# Patient Record
Sex: Female | Born: 1960 | Race: White | Hispanic: No | Marital: Married | Smoking: Never smoker
Health system: Southern US, Community
[De-identification: ages and names within clinical notes are randomized; demographics above are authoritative.]

## PROBLEM LIST (undated history)

## (undated) DIAGNOSIS — F341 Dysthymic disorder: Secondary | ICD-10-CM

## (undated) DIAGNOSIS — E282 Polycystic ovarian syndrome: Secondary | ICD-10-CM

## (undated) DIAGNOSIS — G43909 Migraine, unspecified, not intractable, without status migrainosus: Secondary | ICD-10-CM

## (undated) DIAGNOSIS — Z86711 Personal history of pulmonary embolism: Secondary | ICD-10-CM

## (undated) DIAGNOSIS — R609 Edema, unspecified: Secondary | ICD-10-CM

## (undated) DIAGNOSIS — IMO0001 Reserved for inherently not codable concepts without codable children: Secondary | ICD-10-CM

## (undated) DIAGNOSIS — K2990 Gastroduodenitis, unspecified, without bleeding: Secondary | ICD-10-CM

## (undated) DIAGNOSIS — D649 Anemia, unspecified: Secondary | ICD-10-CM

## (undated) DIAGNOSIS — G4700 Insomnia, unspecified: Secondary | ICD-10-CM

## (undated) DIAGNOSIS — M216X9 Other acquired deformities of unspecified foot: Secondary | ICD-10-CM

## (undated) DIAGNOSIS — I10 Essential (primary) hypertension: Secondary | ICD-10-CM

## (undated) DIAGNOSIS — F32A Depression, unspecified: Secondary | ICD-10-CM

## (undated) DIAGNOSIS — E669 Obesity, unspecified: Secondary | ICD-10-CM

## (undated) DIAGNOSIS — R569 Unspecified convulsions: Secondary | ICD-10-CM

## (undated) DIAGNOSIS — K2970 Gastritis, unspecified, without bleeding: Secondary | ICD-10-CM

## (undated) DIAGNOSIS — R251 Tremor, unspecified: Secondary | ICD-10-CM

## (undated) DIAGNOSIS — E785 Hyperlipidemia, unspecified: Secondary | ICD-10-CM

## (undated) DIAGNOSIS — F329 Major depressive disorder, single episode, unspecified: Secondary | ICD-10-CM

## (undated) DIAGNOSIS — G4730 Sleep apnea, unspecified: Secondary | ICD-10-CM

## (undated) DIAGNOSIS — A6920 Lyme disease, unspecified: Secondary | ICD-10-CM

## (undated) DIAGNOSIS — I6521 Occlusion and stenosis of right carotid artery: Secondary | ICD-10-CM

## (undated) DIAGNOSIS — F419 Anxiety disorder, unspecified: Secondary | ICD-10-CM

## (undated) DIAGNOSIS — K219 Gastro-esophageal reflux disease without esophagitis: Secondary | ICD-10-CM

## (undated) DIAGNOSIS — E041 Nontoxic single thyroid nodule: Secondary | ICD-10-CM

## (undated) DIAGNOSIS — T7840XA Allergy, unspecified, initial encounter: Secondary | ICD-10-CM

## (undated) DIAGNOSIS — N200 Calculus of kidney: Secondary | ICD-10-CM

## (undated) HISTORY — DX: Essential (primary) hypertension: I10

## (undated) HISTORY — DX: Lyme disease, unspecified: A69.20

## (undated) HISTORY — DX: Insomnia, unspecified: G47.00

## (undated) HISTORY — DX: Gastro-esophageal reflux disease without esophagitis: K21.9

## (undated) HISTORY — DX: Anemia, unspecified: D64.9

## (undated) HISTORY — PX: COLONOSCOPY: SHX174

## (undated) HISTORY — DX: Personal history of pulmonary embolism: Z86.711

## (undated) HISTORY — PX: OTHER SURGICAL HISTORY: SHX169

## (undated) HISTORY — DX: Allergy, unspecified, initial encounter: T78.40XA

## (undated) HISTORY — DX: Polycystic ovarian syndrome: E28.2

## (undated) HISTORY — DX: Hyperlipidemia, unspecified: E78.5

## (undated) HISTORY — DX: Major depressive disorder, single episode, unspecified: F32.9

## (undated) HISTORY — DX: Anxiety disorder, unspecified: F41.9

## (undated) HISTORY — DX: Migraine, unspecified, not intractable, without status migrainosus: G43.909

## (undated) HISTORY — DX: Occlusion and stenosis of right carotid artery: I65.21

## (undated) HISTORY — DX: Obesity, unspecified: E66.9

## (undated) HISTORY — DX: Reserved for inherently not codable concepts without codable children: IMO0001

## (undated) HISTORY — DX: Tremor, unspecified: R25.1

## (undated) HISTORY — DX: Sleep apnea, unspecified: G47.30

## (undated) HISTORY — DX: Other acquired deformities of unspecified foot: M21.6X9

## (undated) HISTORY — DX: Nontoxic single thyroid nodule: E04.1

## (undated) HISTORY — DX: Depression, unspecified: F32.A

## (undated) HISTORY — DX: Dysthymic disorder: F34.1

## (undated) HISTORY — DX: Calculus of kidney: N20.0

## (undated) HISTORY — DX: Edema, unspecified: R60.9

## (undated) HISTORY — DX: Gastroduodenitis, unspecified, without bleeding: K29.90

## (undated) HISTORY — DX: Gastritis, unspecified, without bleeding: K29.70

## (undated) HISTORY — DX: Unspecified convulsions: R56.9

---

## 1997-09-30 ENCOUNTER — Encounter: Admission: RE | Admit: 1997-09-30 | Discharge: 1997-12-29 | Payer: Self-pay | Admitting: Family Medicine

## 2001-03-06 ENCOUNTER — Encounter: Payer: Self-pay | Admitting: Otolaryngology

## 2001-03-06 ENCOUNTER — Encounter: Admission: RE | Admit: 2001-03-06 | Discharge: 2001-03-06 | Payer: Self-pay | Admitting: Otolaryngology

## 2002-04-13 ENCOUNTER — Encounter: Admission: RE | Admit: 2002-04-13 | Discharge: 2002-04-13 | Payer: Self-pay | Admitting: Family Medicine

## 2002-04-13 ENCOUNTER — Encounter: Payer: Self-pay | Admitting: Family Medicine

## 2002-05-05 ENCOUNTER — Encounter: Admission: RE | Admit: 2002-05-05 | Discharge: 2002-08-03 | Payer: Self-pay | Admitting: Family Medicine

## 2004-02-14 ENCOUNTER — Inpatient Hospital Stay (HOSPITAL_COMMUNITY): Admission: EM | Admit: 2004-02-14 | Discharge: 2004-02-16 | Payer: Self-pay | Admitting: Family Medicine

## 2004-02-14 ENCOUNTER — Ambulatory Visit: Payer: Self-pay | Admitting: Family Medicine

## 2004-02-17 ENCOUNTER — Ambulatory Visit: Payer: Self-pay | Admitting: Family Medicine

## 2004-02-21 ENCOUNTER — Ambulatory Visit: Payer: Self-pay | Admitting: Family Medicine

## 2004-02-24 ENCOUNTER — Ambulatory Visit: Payer: Self-pay | Admitting: Sports Medicine

## 2004-02-29 ENCOUNTER — Ambulatory Visit: Payer: Self-pay | Admitting: Family Medicine

## 2004-03-03 ENCOUNTER — Ambulatory Visit: Payer: Self-pay | Admitting: Sports Medicine

## 2004-03-09 ENCOUNTER — Ambulatory Visit: Payer: Self-pay | Admitting: Family Medicine

## 2004-03-14 ENCOUNTER — Ambulatory Visit: Payer: Self-pay | Admitting: Family Medicine

## 2004-03-17 ENCOUNTER — Ambulatory Visit: Payer: Self-pay | Admitting: Sports Medicine

## 2004-03-23 ENCOUNTER — Ambulatory Visit: Payer: Self-pay | Admitting: Family Medicine

## 2004-03-30 ENCOUNTER — Encounter: Admission: RE | Admit: 2004-03-30 | Discharge: 2004-03-30 | Payer: Self-pay | Admitting: Family Medicine

## 2004-03-31 ENCOUNTER — Ambulatory Visit: Payer: Self-pay | Admitting: Family Medicine

## 2004-04-07 ENCOUNTER — Ambulatory Visit: Payer: Self-pay | Admitting: Family Medicine

## 2004-04-14 ENCOUNTER — Ambulatory Visit: Payer: Self-pay | Admitting: Family Medicine

## 2004-04-24 ENCOUNTER — Ambulatory Visit: Payer: Self-pay | Admitting: Family Medicine

## 2004-05-08 ENCOUNTER — Ambulatory Visit: Payer: Self-pay | Admitting: Sports Medicine

## 2004-05-23 ENCOUNTER — Ambulatory Visit: Payer: Self-pay | Admitting: Family Medicine

## 2004-05-28 DIAGNOSIS — Z86711 Personal history of pulmonary embolism: Secondary | ICD-10-CM

## 2004-05-28 HISTORY — DX: Personal history of pulmonary embolism: Z86.711

## 2004-06-06 ENCOUNTER — Ambulatory Visit: Payer: Self-pay | Admitting: Family Medicine

## 2004-06-08 ENCOUNTER — Ambulatory Visit: Payer: Self-pay | Admitting: Family Medicine

## 2004-06-20 ENCOUNTER — Ambulatory Visit: Payer: Self-pay | Admitting: Family Medicine

## 2004-06-23 ENCOUNTER — Ambulatory Visit: Payer: Self-pay | Admitting: Family Medicine

## 2004-07-18 ENCOUNTER — Ambulatory Visit: Payer: Self-pay | Admitting: Sports Medicine

## 2004-08-15 ENCOUNTER — Ambulatory Visit: Payer: Self-pay | Admitting: Sports Medicine

## 2004-08-24 ENCOUNTER — Ambulatory Visit (HOSPITAL_COMMUNITY): Admission: RE | Admit: 2004-08-24 | Discharge: 2004-08-24 | Payer: Self-pay | Admitting: Family Medicine

## 2004-08-24 ENCOUNTER — Ambulatory Visit: Payer: Self-pay | Admitting: Family Medicine

## 2005-03-30 ENCOUNTER — Ambulatory Visit: Payer: Self-pay | Admitting: Family Medicine

## 2005-04-09 ENCOUNTER — Ambulatory Visit: Payer: Self-pay | Admitting: Family Medicine

## 2005-04-26 ENCOUNTER — Ambulatory Visit (HOSPITAL_COMMUNITY): Admission: RE | Admit: 2005-04-26 | Discharge: 2005-04-26 | Payer: Self-pay | Admitting: Family Medicine

## 2005-10-23 ENCOUNTER — Ambulatory Visit: Payer: Self-pay | Admitting: Family Medicine

## 2005-10-26 ENCOUNTER — Encounter: Admission: RE | Admit: 2005-10-26 | Discharge: 2005-10-26 | Payer: Self-pay | Admitting: Sports Medicine

## 2005-10-26 ENCOUNTER — Ambulatory Visit: Payer: Self-pay | Admitting: Family Medicine

## 2005-10-30 ENCOUNTER — Ambulatory Visit: Payer: Self-pay | Admitting: Family Medicine

## 2005-11-02 ENCOUNTER — Encounter: Admission: RE | Admit: 2005-11-02 | Discharge: 2005-11-02 | Payer: Self-pay | Admitting: Sports Medicine

## 2006-02-18 ENCOUNTER — Ambulatory Visit: Payer: Self-pay | Admitting: Sports Medicine

## 2006-02-25 ENCOUNTER — Encounter: Admission: RE | Admit: 2006-02-25 | Discharge: 2006-05-26 | Payer: Self-pay | Admitting: Family Medicine

## 2006-05-01 ENCOUNTER — Ambulatory Visit (HOSPITAL_COMMUNITY): Admission: RE | Admit: 2006-05-01 | Discharge: 2006-05-01 | Payer: Self-pay | Admitting: Obstetrics

## 2006-06-21 ENCOUNTER — Encounter (INDEPENDENT_AMBULATORY_CARE_PROVIDER_SITE_OTHER): Payer: Self-pay | Admitting: Family Medicine

## 2006-06-21 ENCOUNTER — Ambulatory Visit: Payer: Self-pay | Admitting: Family Medicine

## 2006-06-21 LAB — CONVERTED CEMR LAB
Albumin: 4.4 g/dL (ref 3.5–5.2)
Alkaline Phosphatase: 37 units/L — ABNORMAL LOW (ref 39–117)
BUN: 18 mg/dL (ref 6–23)
Calcium: 9.1 mg/dL (ref 8.4–10.5)
Chloride: 107 meq/L (ref 96–112)
HDL: 60 mg/dL (ref >39)
Potassium: 4.2 meq/L (ref 3.5–5.3)
TSH: 0.93 microintl units/mL (ref 0.350–5.50)
Total CHOL/HDL Ratio: 3.2
Total Protein: 7 g/dL (ref 6.0–8.3)
VLDL: 16 mg/dL (ref 0–40)

## 2006-07-25 DIAGNOSIS — G43909 Migraine, unspecified, not intractable, without status migrainosus: Secondary | ICD-10-CM | POA: Insufficient documentation

## 2006-07-25 DIAGNOSIS — I2699 Other pulmonary embolism without acute cor pulmonale: Secondary | ICD-10-CM | POA: Insufficient documentation

## 2006-07-25 DIAGNOSIS — E785 Hyperlipidemia, unspecified: Secondary | ICD-10-CM | POA: Insufficient documentation

## 2006-08-24 ENCOUNTER — Emergency Department (HOSPITAL_COMMUNITY): Admission: EM | Admit: 2006-08-24 | Discharge: 2006-08-24 | Payer: Self-pay | Admitting: Emergency Medicine

## 2006-11-14 ENCOUNTER — Ambulatory Visit: Payer: Self-pay | Admitting: Family Medicine

## 2006-11-14 ENCOUNTER — Ambulatory Visit: Payer: Self-pay | Admitting: Sports Medicine

## 2006-11-14 ENCOUNTER — Observation Stay (HOSPITAL_COMMUNITY): Admission: AD | Admit: 2006-11-14 | Discharge: 2006-11-15 | Payer: Self-pay | Admitting: Family Medicine

## 2006-11-14 ENCOUNTER — Telehealth: Payer: Self-pay | Admitting: *Deleted

## 2006-11-14 ENCOUNTER — Ambulatory Visit (HOSPITAL_COMMUNITY): Admission: RE | Admit: 2006-11-14 | Discharge: 2006-11-14 | Payer: Self-pay | Admitting: Family Medicine

## 2006-11-15 ENCOUNTER — Encounter (INDEPENDENT_AMBULATORY_CARE_PROVIDER_SITE_OTHER): Payer: Self-pay | Admitting: Family Medicine

## 2006-11-15 DIAGNOSIS — K2990 Gastroduodenitis, unspecified, without bleeding: Secondary | ICD-10-CM

## 2006-11-15 DIAGNOSIS — K2970 Gastritis, unspecified, without bleeding: Secondary | ICD-10-CM | POA: Insufficient documentation

## 2006-11-18 ENCOUNTER — Ambulatory Visit: Payer: Self-pay | Admitting: Sports Medicine

## 2006-11-27 ENCOUNTER — Encounter (INDEPENDENT_AMBULATORY_CARE_PROVIDER_SITE_OTHER): Payer: Self-pay | Admitting: Family Medicine

## 2007-04-30 ENCOUNTER — Encounter: Payer: Self-pay | Admitting: *Deleted

## 2007-05-01 ENCOUNTER — Ambulatory Visit: Payer: Self-pay | Admitting: Family Medicine

## 2007-05-01 DIAGNOSIS — F329 Major depressive disorder, single episode, unspecified: Secondary | ICD-10-CM

## 2007-05-01 DIAGNOSIS — K219 Gastro-esophageal reflux disease without esophagitis: Secondary | ICD-10-CM

## 2007-05-28 ENCOUNTER — Ambulatory Visit (HOSPITAL_COMMUNITY): Admission: RE | Admit: 2007-05-28 | Discharge: 2007-05-28 | Payer: Self-pay | Admitting: Family Medicine

## 2007-06-17 ENCOUNTER — Emergency Department (HOSPITAL_COMMUNITY): Admission: EM | Admit: 2007-06-17 | Discharge: 2007-06-17 | Payer: Self-pay | Admitting: Emergency Medicine

## 2007-09-22 ENCOUNTER — Ambulatory Visit: Payer: Self-pay | Admitting: Family Medicine

## 2007-09-22 LAB — CONVERTED CEMR LAB: Rapid Strep: NEGATIVE

## 2008-02-26 ENCOUNTER — Encounter (INDEPENDENT_AMBULATORY_CARE_PROVIDER_SITE_OTHER): Payer: Self-pay | Admitting: Family Medicine

## 2008-02-26 ENCOUNTER — Ambulatory Visit: Payer: Self-pay | Admitting: Family Medicine

## 2008-02-26 LAB — CONVERTED CEMR LAB
BUN: 15 mg/dL (ref 6–23)
CO2: 21 meq/L (ref 19–32)
Chloride: 105 meq/L (ref 96–112)
Glucose, Bld: 83 mg/dL (ref 70–99)
HDL: 67 mg/dL (ref >39)
Potassium: 4 meq/L (ref 3.5–5.3)
Total CHOL/HDL Ratio: 4.5
Triglycerides: 132 mg/dL (ref <150)
VLDL: 26 mg/dL (ref 0–40)

## 2008-03-01 ENCOUNTER — Telehealth (INDEPENDENT_AMBULATORY_CARE_PROVIDER_SITE_OTHER): Payer: Self-pay | Admitting: Family Medicine

## 2008-03-07 ENCOUNTER — Emergency Department (HOSPITAL_BASED_OUTPATIENT_CLINIC_OR_DEPARTMENT_OTHER): Admission: EM | Admit: 2008-03-07 | Discharge: 2008-03-07 | Payer: Self-pay | Admitting: Emergency Medicine

## 2008-03-08 ENCOUNTER — Ambulatory Visit: Payer: Self-pay | Admitting: Family Medicine

## 2008-03-08 DIAGNOSIS — T887XXA Unspecified adverse effect of drug or medicament, initial encounter: Secondary | ICD-10-CM | POA: Insufficient documentation

## 2008-04-27 ENCOUNTER — Telehealth (INDEPENDENT_AMBULATORY_CARE_PROVIDER_SITE_OTHER): Payer: Self-pay | Admitting: *Deleted

## 2008-07-08 ENCOUNTER — Encounter (INDEPENDENT_AMBULATORY_CARE_PROVIDER_SITE_OTHER): Payer: Self-pay | Admitting: Family Medicine

## 2008-07-08 ENCOUNTER — Ambulatory Visit: Payer: Self-pay | Admitting: Family Medicine

## 2008-07-08 DIAGNOSIS — D649 Anemia, unspecified: Secondary | ICD-10-CM

## 2008-07-08 DIAGNOSIS — Z6841 Body Mass Index (BMI) 40.0 and over, adult: Secondary | ICD-10-CM | POA: Insufficient documentation

## 2008-07-08 DIAGNOSIS — E669 Obesity, unspecified: Secondary | ICD-10-CM

## 2008-07-08 DIAGNOSIS — R609 Edema, unspecified: Secondary | ICD-10-CM | POA: Insufficient documentation

## 2008-07-08 LAB — CONVERTED CEMR LAB
ALT: 12 units/L (ref 0–35)
AST: 15 units/L (ref 0–37)
Albumin: 4.2 g/dL (ref 3.5–5.2)
Alkaline Phosphatase: 44 units/L (ref 39–117)
Bilirubin Urine: NEGATIVE
CO2: 21 meq/L (ref 19–32)
Glucose, Urine, Semiquant: NEGATIVE
HCT: 36.4 % (ref 36.0–46.0)
Hemoglobin: 11.5 g/dL — ABNORMAL LOW (ref 12.0–15.0)
Ketones, urine, test strip: NEGATIVE
MCHC: 31.6 g/dL (ref 30.0–36.0)
Platelets: 321 10*3/uL (ref 150–400)
RBC: 4.26 M/uL (ref 3.87–5.11)
Total Bilirubin: 0.5 mg/dL (ref 0.3–1.2)
Total Protein: 7.4 g/dL (ref 6.0–8.3)
Urobilinogen, UA: 0.2

## 2008-07-12 ENCOUNTER — Encounter (INDEPENDENT_AMBULATORY_CARE_PROVIDER_SITE_OTHER): Payer: Self-pay | Admitting: Family Medicine

## 2008-07-12 LAB — CONVERTED CEMR LAB
Iron: 44 ug/dL (ref 42–145)
Saturation Ratios: 10 % — ABNORMAL LOW (ref 20–55)
UIBC: 393 ug/dL

## 2008-07-13 ENCOUNTER — Encounter (INDEPENDENT_AMBULATORY_CARE_PROVIDER_SITE_OTHER): Payer: Self-pay | Admitting: Family Medicine

## 2008-07-13 ENCOUNTER — Encounter (INDEPENDENT_AMBULATORY_CARE_PROVIDER_SITE_OTHER): Payer: Self-pay | Admitting: *Deleted

## 2008-07-13 ENCOUNTER — Telehealth (INDEPENDENT_AMBULATORY_CARE_PROVIDER_SITE_OTHER): Payer: Self-pay | Admitting: Family Medicine

## 2008-07-20 ENCOUNTER — Ambulatory Visit: Payer: Self-pay | Admitting: Cardiology

## 2008-07-29 ENCOUNTER — Ambulatory Visit: Payer: Self-pay

## 2008-07-29 ENCOUNTER — Ambulatory Visit: Payer: Self-pay | Admitting: Cardiology

## 2008-07-29 ENCOUNTER — Encounter (INDEPENDENT_AMBULATORY_CARE_PROVIDER_SITE_OTHER): Payer: Self-pay | Admitting: Family Medicine

## 2008-07-29 ENCOUNTER — Encounter: Payer: Self-pay | Admitting: Cardiology

## 2008-07-29 LAB — CONVERTED CEMR LAB: Pro B Natriuretic peptide (BNP): 12 pg/mL (ref 0.0–100.0)

## 2008-08-04 ENCOUNTER — Telehealth (INDEPENDENT_AMBULATORY_CARE_PROVIDER_SITE_OTHER): Payer: Self-pay | Admitting: Family Medicine

## 2008-08-04 ENCOUNTER — Ambulatory Visit: Payer: Self-pay | Admitting: Cardiology

## 2008-08-04 ENCOUNTER — Ambulatory Visit: Payer: Self-pay | Admitting: Family Medicine

## 2008-08-05 ENCOUNTER — Ambulatory Visit: Payer: Self-pay | Admitting: Cardiovascular Disease

## 2008-08-12 ENCOUNTER — Encounter: Payer: Self-pay | Admitting: Cardiology

## 2008-08-12 ENCOUNTER — Ambulatory Visit: Payer: Self-pay | Admitting: Cardiology

## 2008-09-24 ENCOUNTER — Ambulatory Visit: Payer: Self-pay | Admitting: Family Medicine

## 2008-09-24 DIAGNOSIS — M25579 Pain in unspecified ankle and joints of unspecified foot: Secondary | ICD-10-CM

## 2008-09-30 ENCOUNTER — Ambulatory Visit: Payer: Self-pay

## 2008-09-30 DIAGNOSIS — M216X9 Other acquired deformities of unspecified foot: Secondary | ICD-10-CM

## 2008-10-09 ENCOUNTER — Emergency Department (HOSPITAL_COMMUNITY): Admission: EM | Admit: 2008-10-09 | Discharge: 2008-10-09 | Payer: Self-pay | Admitting: Family Medicine

## 2008-10-14 ENCOUNTER — Ambulatory Visit: Payer: Self-pay | Admitting: Cardiology

## 2008-10-18 ENCOUNTER — Encounter (INDEPENDENT_AMBULATORY_CARE_PROVIDER_SITE_OTHER): Payer: Self-pay | Admitting: Family Medicine

## 2008-10-20 LAB — CONVERTED CEMR LAB
Albumin: 3.6 g/dL (ref 3.5–5.2)
Bilirubin, Direct: 0 mg/dL (ref 0.0–0.3)
HDL: 57.1 mg/dL (ref >39.00)
Total Bilirubin: 0.6 mg/dL (ref 0.3–1.2)

## 2008-12-09 ENCOUNTER — Ambulatory Visit: Payer: Self-pay | Admitting: Family Medicine

## 2009-02-14 ENCOUNTER — Ambulatory Visit: Payer: Self-pay | Admitting: Cardiology

## 2009-02-14 DIAGNOSIS — IMO0001 Reserved for inherently not codable concepts without codable children: Secondary | ICD-10-CM | POA: Insufficient documentation

## 2009-03-17 ENCOUNTER — Emergency Department (HOSPITAL_COMMUNITY): Admission: EM | Admit: 2009-03-17 | Discharge: 2009-03-17 | Payer: Self-pay | Admitting: Emergency Medicine

## 2009-05-05 ENCOUNTER — Ambulatory Visit: Payer: Self-pay | Admitting: Family Medicine

## 2009-05-05 DIAGNOSIS — G4700 Insomnia, unspecified: Secondary | ICD-10-CM | POA: Insufficient documentation

## 2009-05-20 ENCOUNTER — Ambulatory Visit: Payer: Self-pay | Admitting: Radiology

## 2009-05-20 ENCOUNTER — Emergency Department (HOSPITAL_COMMUNITY): Admission: EM | Admit: 2009-05-20 | Discharge: 2009-05-20 | Payer: Self-pay | Admitting: Emergency Medicine

## 2009-05-20 ENCOUNTER — Emergency Department (HOSPITAL_BASED_OUTPATIENT_CLINIC_OR_DEPARTMENT_OTHER): Admission: EM | Admit: 2009-05-20 | Discharge: 2009-05-20 | Payer: Self-pay | Admitting: Emergency Medicine

## 2009-05-23 ENCOUNTER — Ambulatory Visit: Payer: Self-pay | Admitting: Family Medicine

## 2009-05-23 DIAGNOSIS — R1031 Right lower quadrant pain: Secondary | ICD-10-CM

## 2009-06-06 ENCOUNTER — Telehealth: Payer: Self-pay | Admitting: Family Medicine

## 2009-06-16 ENCOUNTER — Ambulatory Visit: Payer: Self-pay | Admitting: Family Medicine

## 2009-06-23 ENCOUNTER — Encounter (INDEPENDENT_AMBULATORY_CARE_PROVIDER_SITE_OTHER): Payer: Self-pay | Admitting: *Deleted

## 2009-07-14 ENCOUNTER — Ambulatory Visit: Payer: Self-pay | Admitting: Family Medicine

## 2009-08-02 ENCOUNTER — Ambulatory Visit: Payer: Self-pay | Admitting: Cardiology

## 2009-08-02 ENCOUNTER — Encounter: Payer: Self-pay | Admitting: Family Medicine

## 2009-08-02 ENCOUNTER — Observation Stay (HOSPITAL_COMMUNITY): Admission: AD | Admit: 2009-08-02 | Discharge: 2009-08-03 | Payer: Self-pay | Admitting: Family Medicine

## 2009-08-02 ENCOUNTER — Ambulatory Visit: Payer: Self-pay | Admitting: Family Medicine

## 2009-08-02 DIAGNOSIS — R079 Chest pain, unspecified: Secondary | ICD-10-CM | POA: Insufficient documentation

## 2009-08-03 LAB — CONVERTED CEMR LAB: HDL: 44 mg/dL

## 2009-08-08 ENCOUNTER — Encounter: Payer: Self-pay | Admitting: Family Medicine

## 2009-08-09 ENCOUNTER — Encounter: Payer: Self-pay | Admitting: Cardiology

## 2009-08-09 ENCOUNTER — Ambulatory Visit: Payer: Self-pay | Admitting: Cardiology

## 2009-08-09 ENCOUNTER — Encounter (HOSPITAL_COMMUNITY): Admission: RE | Admit: 2009-08-09 | Discharge: 2009-09-28 | Payer: Self-pay | Admitting: Cardiology

## 2009-08-09 ENCOUNTER — Ambulatory Visit: Payer: Self-pay

## 2009-08-11 ENCOUNTER — Ambulatory Visit: Payer: Self-pay | Admitting: Family Medicine

## 2009-08-11 DIAGNOSIS — F341 Dysthymic disorder: Secondary | ICD-10-CM

## 2009-08-11 DIAGNOSIS — I10 Essential (primary) hypertension: Secondary | ICD-10-CM

## 2009-08-16 ENCOUNTER — Ambulatory Visit: Payer: Self-pay | Admitting: Cardiology

## 2009-10-07 ENCOUNTER — Telehealth: Payer: Self-pay | Admitting: *Deleted

## 2009-10-31 ENCOUNTER — Telehealth: Payer: Self-pay | Admitting: Family Medicine

## 2009-10-31 ENCOUNTER — Ambulatory Visit: Payer: Self-pay | Admitting: Family Medicine

## 2009-10-31 DIAGNOSIS — J029 Acute pharyngitis, unspecified: Secondary | ICD-10-CM

## 2009-10-31 DIAGNOSIS — R05 Cough: Secondary | ICD-10-CM

## 2009-10-31 DIAGNOSIS — R053 Chronic cough: Secondary | ICD-10-CM | POA: Insufficient documentation

## 2009-11-22 ENCOUNTER — Telehealth: Payer: Self-pay | Admitting: Family Medicine

## 2010-01-23 ENCOUNTER — Encounter: Payer: Self-pay | Admitting: Sports Medicine

## 2010-01-23 ENCOUNTER — Ambulatory Visit: Payer: Self-pay | Admitting: Family Medicine

## 2010-01-23 DIAGNOSIS — R1011 Right upper quadrant pain: Secondary | ICD-10-CM

## 2010-01-23 LAB — CONVERTED CEMR LAB
ALT: 15 units/L (ref 0–35)
AST: 17 U/L (ref 0–37)
Albumin: 4.1 g/dL (ref 3.5–5.2)
Alkaline Phosphatase: 49 U/L (ref 39–117)
Amylase: 38 units/L (ref 0–105)
BUN: 15 mg/dL (ref 6–23)
Basophils Absolute: 0 10*3/uL (ref 0.0–0.1)
Basophils Relative: 0 % (ref 0–1)
CO2: 23 meq/L (ref 19–32)
Calcium: 10.1 mg/dL (ref 8.4–10.5)
Chloride: 103 meq/L (ref 96–112)
Creatinine, Ser: 0.77 mg/dL (ref 0.40–1.20)
Eosinophils Absolute: 0.1 10*3/uL (ref 0.0–0.7)
Eosinophils Relative: 1 % (ref 0–5)
Glucose, Bld: 88 mg/dL (ref 70–99)
H Pylori IgG: NEGATIVE
HCT: 34.1 % — ABNORMAL LOW (ref 36.0–46.0)
Hemoglobin: 10.6 g/dL — ABNORMAL LOW (ref 12.0–15.0)
Lipase: 19 units/L (ref 0–75)
Lymphocytes Relative: 21 % (ref 12–46)
Lymphs Abs: 2.2 K/uL (ref 0.7–4.0)
MCHC: 31.1 g/dL (ref 30.0–36.0)
MCV: 77.3 fL — ABNORMAL LOW (ref 78.0–100.0)
Monocytes Absolute: 0.8 K/uL (ref 0.1–1.0)
Monocytes Relative: 8 % (ref 3–12)
Neutro Abs: 7.6 K/uL (ref 1.7–7.7)
Neutrophils Relative %: 70 % (ref 43–77)
Platelets: 387 10*3/uL (ref 150–400)
Potassium: 4 meq/L (ref 3.5–5.3)
RBC: 4.41 M/uL (ref 3.87–5.11)
RDW: 15.4 % (ref 11.5–15.5)
Sodium: 138 meq/L (ref 135–145)
Total Bilirubin: 0.4 mg/dL (ref 0.3–1.2)
Total Protein: 6.9 g/dL (ref 6.0–8.3)
Triglycerides: 119 mg/dL (ref <150)
WBC: 10.8 10*3/microliter — ABNORMAL HIGH (ref 4.0–10.5)

## 2010-01-24 ENCOUNTER — Encounter: Admission: RE | Admit: 2010-01-24 | Discharge: 2010-01-24 | Payer: Self-pay | Admitting: Family Medicine

## 2010-01-25 ENCOUNTER — Telehealth: Payer: Self-pay | Admitting: *Deleted

## 2010-01-26 ENCOUNTER — Ambulatory Visit: Payer: Self-pay | Admitting: Family Medicine

## 2010-02-02 ENCOUNTER — Encounter: Payer: Self-pay | Admitting: Family Medicine

## 2010-05-11 ENCOUNTER — Ambulatory Visit: Payer: Self-pay | Admitting: Family Medicine

## 2010-05-23 ENCOUNTER — Telehealth: Payer: Self-pay | Admitting: Family Medicine

## 2010-05-25 ENCOUNTER — Ambulatory Visit: Payer: Self-pay | Admitting: Family Medicine

## 2010-06-08 ENCOUNTER — Ambulatory Visit: Admission: RE | Admit: 2010-06-08 | Discharge: 2010-06-08 | Payer: Self-pay | Source: Home / Self Care

## 2010-06-18 ENCOUNTER — Encounter: Payer: Self-pay | Admitting: Sports Medicine

## 2010-06-18 ENCOUNTER — Encounter: Payer: Self-pay | Admitting: Orthopedic Surgery

## 2010-06-25 LAB — CONVERTED CEMR LAB
AST: 20 units/L (ref 0–37)
Albumin: 3.7 g/dL (ref 3.5–5.2)
Alkaline Phosphatase: 37 units/L — ABNORMAL LOW (ref 39–117)
Bilirubin, Direct: 0 mg/dL (ref 0.0–0.3)
Direct LDL: 181.4 mg/dL
HDL: 44.3 mg/dL (ref >39.00)
Total CHOL/HDL Ratio: 5
Total Protein: 7.2 g/dL (ref 6.0–8.3)

## 2010-06-27 NOTE — Progress Notes (Signed)
Summary: wellbutrin, trazadone refilled  Medications Added CITALOPRAM HYDROBROMIDE 20 MG  TABS (CITALOPRAM HYDROBROMIDE) 1 tab by mouth daily CITALOPRAM HYDROBROMIDE 20 MG  TABS (CITALOPRAM HYDROBROMIDE) 1 tab by mouth daily PRILOSEC 40 MG  CPDR (OMEPRAZOLE) 1 tab by mouth daily PRILOSEC 40 MG  CPDR (OMEPRAZOLE) as needed MIDRIN 325-65-100 MG CAPS (APAP-ISOMETHEPTENE-DICHLORAL) 1 tab by mouth 1 hr as needed migrain. Max of 5 every 12 hours MIDRIN 325-65-100 MG CAPS (APAP-ISOMETHEPTENE-DICHLORAL) 1 tab by mouth 1 hr as needed migrain. Max of 5 every 12 hours RANITIDINE HCL 150 MG CAPS (RANITIDINE HCL) 1 tab by mouth at bedtime RANITIDINE HCL 150 MG CAPS (RANITIDINE HCL) 1 tab by mouth at bedtime KETOPROFEN 50 MG CAPS (KETOPROFEN) 1 tab by mouth q 6-8 hrs as needed headache KETOROLAC TROMETHAMINE 10 MG TABS (KETOROLAC TROMETHAMINE) 1 tab by mouth q 4-6 hrs. DO NOT USE MORE THAN 5 DAYS IN A ROW KETOROLAC TROMETHAMINE 10 MG TABS (KETOROLAC TROMETHAMINE) 1 tab by mouth q 4-6 hrs. DO NOT USE MORE THAN 5 DAYS IN A ROW PRAVASTATIN SODIUM 40 MG TABS (PRAVASTATIN SODIUM) 1 tab by mouth daily PRAVASTATIN SODIUM 40 MG TABS (PRAVASTATIN SODIUM) 1 tab by mouth daily NAPROXEN 500 MG TABS (NAPROXEN) 1 tab by mouth two times a day as needed migraine Do not take if taking Celebrex NAPROXEN 500 MG TABS (NAPROXEN) 1 tab by mouth two times a day as needed migraine Do not take if taking Celebrex FUROSEMIDE 20 MG TABS (FUROSEMIDE) 1 tab by mouth daily FUROSEMIDE 20 MG TABS (FUROSEMIDE) 1 tab by mouth daily FUROSEMIDE 20 MG TABS (FUROSEMIDE) 1 tab by mouth daily AMOXICILLIN 875 MG TABS (AMOXICILLIN) 1 tablet by mouth two times a day x 7 days AMOXICILLIN 875 MG TABS (AMOXICILLIN) 1 tablet by mouth two times a day x 7 days VENTOLIN HFA 108 (90 BASE) MCG/ACT AERS (ALBUTEROL SULFATE) 2 puffs inhaled every 4 hrs as needed for cough, sob VENTOLIN HFA 108 (90 BASE) MCG/ACT AERS (ALBUTEROL SULFATE) 2 puffs inhaled every 4  hrs as needed for cough, sob TUSSIONEX PENNKINETIC ER 8-10 MG/5ML LQCR (CHLORPHENIRAMINE-HYDROCODONE) 1 tsp by mouth at bedtime as needed for cough - disp 100 mL TUSSIONEX PENNKINETIC ER 8-10 MG/5ML LQCR (CHLORPHENIRAMINE-HYDROCODONE) 1 tsp by mouth at bedtime as needed for cough - disp 100 mL * FLAX SEED OIL two times a day * FLAX SEED OIL two times a day * CALCIUM AND MAGNESSIUM bid * CALCIUM AND MAGNESSIUM bid * FIBER two times a day * FIBER two times a day CRESTOR 5 MG TABS (ROSUVASTATIN CALCIUM) take one tab every other day CRESTOR 5 MG TABS (ROSUVASTATIN CALCIUM) take one tab every other day COQ10 100 MG CAPS (COENZYME Q10) Take 1 capsule by mouth once a day COQ10 100 MG CAPS (COENZYME Q10) Take 1 capsule by mouth once a day ASPIRIN 81 MG TBEC (ASPIRIN) Take one tablet by mouth daily ASPIRIN 81 MG TBEC (ASPIRIN) Take one tablet by mouth daily APPLE CIDER VINEGAR  TABS (MISC NATURAL PRODUCTS) take 2 tablets once daily APPLE CIDER VINEGAR  TABS (MISC NATURAL PRODUCTS) take 2 tablets once daily GLUCOSAMINE-CHONDROITIN-VIT D3 1500-1200-800 MG-MG-UNIT PACK (GLUCOSAMINE-CHONDROITIN-VIT D3) take one tablet once daily GLUCOSAMINE-CHONDROITIN-VIT D3 1500-1200-800 MG-MG-UNIT PACK (GLUCOSAMINE-CHONDROITIN-VIT D3) take one tablet once daily FUROSEMIDE 20 MG TABS (FUROSEMIDE) 1 daily as needed FUROSEMIDE 20 MG TABS (FUROSEMIDE) 1 daily as needed NIASPAN 500 MG CR-TABS (NIACIN (ANTIHYPERLIPIDEMIC)) 1 at bedtime  x2 weeks then (2) at bedtime-continue (2) at bedtime NIASPAN 500 MG CR-TABS (NIACIN (ANTIHYPERLIPIDEMIC)) 1  at bedtime  x2 weeks then (2) at bedtime-continue (2) at bedtime TRAZODONE HCL 50 MG TABS (TRAZODONE HCL) Take 1-2 at night for sleep WELLBUTRIN SR 150 MG XR12H-TAB (BUPROPION HCL) Take 1 by mouth daily for depression       Phone Note Refill Request Call back at Home Phone [phone] Message from:  Patient  Refills Requested: Medication #1:  TRAZODONE HCL 50 MG TABS Take  1-2 at night for sleep  Medication #2:  WELLBUTRIN SR 150 MG XR12H-TAB Take 1 by mouth daily for depression. [facility] [address]  Initial call taken by: De Nurse,  June 06, 2009 9:15 AM  Follow-up for Phone Call        to pcp Follow-up by: Golden Circle RN,  June 06, 2009 9:27 AM    Prescriptions: WELLBUTRIN SR 150 MG XR12H-TAB (BUPROPION HCL) Take 1 by mouth daily for depression  #30 x 2   Entered and Authorized by:   Sarah Swaziland MD   Signed by:   Sarah Swaziland MD on 06/06/2009   Method used:   Electronically to        [facility] [phone][facility] (retail)       [address]       [place], [place]  [address]       Ph: [phone] or [phone]       Fax: [phone]   RxID:   [id number] TRAZODONE HCL 50 MG TABS (TRAZODONE HCL) Take 1-2 at night for sleep  #45 x 0   Entered and Authorized by:   Sarah Swaziland MD   Signed by:   Sarah Swaziland MD on 06/06/2009   Method used:   Electronically to        [facility] [phone][facility] (retail)       [address]       [place], [place]  [address]       Ph: [phone] or [phone]       Fax: [phone]   RxID:   [id number]
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
## 2010-06-27 NOTE — Progress Notes (Signed)
Summary: triage   Phone Note Call from Patient Call back at Home Phone [phone]   Caller: Patient Summary of Call: Pt has real bad sore throat and earache wondering if she can get in today? Initial call taken by: Clydell Hakim,  October 31, 2009 9:12 AM  Follow-up for Phone Call        started yesterday. getting worse. bad cough "forever". taking tylenol uses with little relief. feel "like there is a knife in my ear". she will come now. aware she will not be seeing her pcp & there may be a wait. Follow-up by: Golden Circle RN,  October 31, 2009 9:22 AM

## 2010-06-27 NOTE — Assessment & Plan Note (Signed)
Summary: hfu,df   Vital Signs:  Patient profile:   50 year old female Height:      64 inches Weight:      226.6 pounds BMI:     39.04 Temp:     98.3 degrees F oral Pulse rate:   76 / minute BP sitting:   117 / 76  (left arm) Cuff size:   large  Vitals Entered By: Gladstone Pih (August 11, 2009 9:03 AM) CC: hospital F/U Is Patient Diabetic? No Pain Assessment Patient in pain? no        Primary Care Provider:  Cerena Baine Swaziland MD  CC:  hospital F/U.  History of Present Illness: Has had watery diarrhea since last night.  No vomiting.   Exercised last night.  Feels her exercise tolerance was much less than usual.  Culdn't lift as much as usual.  Felt short of breath, and still has trouble walking up stairs because of dyspnea.  THis was present before hospitalization.   Insurance has denied her hospitalization.  Pt has questions about the bill.   Stress is not a lot better.  Trouble at work, with daughter, doing taxes for herself, her sons, her parents. Taking a few Xanax.  Pt interested in seeing a therapist.  Does not like medicines.   Still with some chest pain. Feels like she is being pinched.  Not muscular.  Happens when exercising.  Would like to know what is causing the the pain.   Doing well with Wellbutrin.  Depressive symptoms much improved.  Habits & Providers  Alcohol-Tobacco-Diet     Tobacco Status: never  Current Medications (verified): 1)  Prilosec 40 Mg  Cpdr (Omeprazole) .... As Needed 2)  Trazodone Hcl 50 Mg Tabs (Trazodone Hcl) .... Take 1-2 At Night For Sleep 3)  Wellbutrin Sr 150 Mg Xr12h-Tab (Bupropion Hcl) .... Take 1 By Mouth Daily For Depression 4)  Magnesium 5)  Fiber 6)  Lisinopril 10 Mg Tabs (Lisinopril) .Marland Kitchen.. 1 By Mouth Daily For High Blood Pressure 7)  Alprazolam 0.5 Mg Tabs (Alprazolam) .Marland Kitchen.. 1 By Mouth Two Times A Day As Needed Anxiety  Allergies: 1)  ! Pravachol 2)  ! Celebrex 3)  ! * Estragen 4)  ! Crestor (Rosuvastatin Calcium) 5)  Codeine  Phosphate (Codeine Phosphate)  Review of Systems       see HPI  Physical Exam  General:  Well-developed,well-nourished,in no acute distress; alert,appropriate and cooperative throughout examination  Obese.  vitals noted Chest Wall:  No TTP Lungs:  Normal respiratory effort, chest expands symmetrically. Lungs are clear to auscultation, no crackles or wheezes. Heart:  Normal rate and regular rhythm. S1 and S2 normal without gallop, murmur, click, rub or other extra sounds.   Impression & Recommendations:  Problem # 1:  CHEST PAIN, ACUTE (ICD-786.50)  Unclear etiology.  Reviewed stress test, CT angio and D-dimer results.  Unlikely to be cardiac or PE.  PT's poor exercise tolerance is concerning, but may be due to deconditioning while in hospital.  If continues as pt tries to continue to exercise, consider further workup. Pain likely has anxiety component to it.  Pt averse to meds, and I support her decision to not take Xanax.  Refer to Dr. Pascal Lux for CBT.  Orders: [facility]- Est Level  3 (81191)  Problem # 2:  HYPERLIPIDEMIA (ICD-272.4)  Improved on lipid panel done in hospital.  Continue current management.  (Pt refuses statins, doing ok without them).  Orders: [facility]- Est Level  3 ([id number])  Problem # 3:  DEPRESSION/ANXIETY (ICD-300.4)  Doing well on wellbutrin - has much improved depressive signs but now with sig anxiety.  Refer to Dr. Pascal Lux for CBT.  Orders: [facility]- Est Level  3 ([id number])  Problem # 4:  ESSENTIAL HYPERTENSION, BENIGN (ICD-401.1)  Started on lisonopril in hospital.  Should have creatinine checked in 1 week.   Her updated medication list for this problem includes:    Lisinopril 10 Mg Tabs (Lisinopril) .Marland Kitchen... 1 by mouth daily for high blood pressure  Orders: [facility]- Est Level  3 (99213)Future Orders: Basic [facility] ([id number]) ... 08/24/2010  Complete Medication List: 1)  Prilosec 40 Mg Cpdr (Omeprazole) .... As needed 2)  Trazodone Hcl 50 Mg Tabs (Trazodone hcl) .... Take  1-2 at night for sleep 3)  Wellbutrin Sr 150 Mg Xr12h-tab (Bupropion hcl) .... Take 1 by mouth daily for depression 4)  Magnesium  5)  Fiber  6)  Lisinopril 10 Mg Tabs (Lisinopril) .Marland Kitchen.. 1 by mouth daily for high blood pressure 7)  Alprazolam 0.5 Mg Tabs (Alprazolam) .Marland Kitchen.. 1 by mouth two times a day as needed anxiety   Prevention & Chronic Care Immunizations   Influenza vaccine: Fluvax Non-MCR  (05/05/2009)    Tetanus booster: 06/16/2009: Tdap    Pneumococcal vaccine: Not documented  Other Screening   Pap smear: Not documented    Mammogram: normal  (05/28/2007)   Smoking status: never  (08/11/2009)  Lipids   Total Cholesterol: 196  (08/03/2009)   LDL: 118  (08/03/2009)   LDL Direct: 181.4  (02/14/2009)   HDL: 44  (08/03/2009)   Triglycerides: 172  (08/03/2009)    SGOT (AST): 20  (02/14/2009)   SGPT (ALT): 17  (02/14/2009)   Alkaline phosphatase: 37  (02/14/2009)   Total bilirubin: 0.6  (02/14/2009)    Lipid flowsheet reviewed?: Yes   Progress toward LDL goal: At goal  Hypertension   Last Blood Pressure: 117 / 76  (08/11/2009)   Serum creatinine: 0.63  (07/08/2008)   Serum potassium 4.2  (07/08/2008)  Self-Management Support :   Personal Goals (by the next clinic visit) :      Personal LDL goal: 100  (05/05/2009)    Hypertension self-management support: Referred for medical nutrition therapy  (06/16/2009)    Lipid self-management support: Referred for medical nutrition therapy, Written self-care plan  (06/16/2009)     Lipid self-management support not done because: Refused  (05/05/2009)
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
## 2010-06-27 NOTE — Progress Notes (Signed)
Summary: meds prob   Phone Note Call from Patient Call back at Home Phone [phone]   Caller: Patient Summary of Call: pt is questioning about getting BUPROPION HCL 150 MG XR24H-TAB instead of getting the SR that she normally gets. Initial call taken by: De Nurse,  November 22, 2009 9:24 AM  Follow-up for Phone Call        uses [facility] on [address]. did pick up rx for the XR on 11/14/09. wants to know if that is correct.  to pcp Follow-up by: Golden Circle RN,  November 22, 2009 9:59 AM  Additional Follow-up for Phone Call Additional follow up Details #1::        Yes.  I saw that she was only taking the SR (12 hour med) daily, and the 24 hour (XL) should be last longer and will probably work better.  If she is really happy with the SR dosing, then I can prescribe that instead, but I think the 24 XL will be better. Additional Follow-up by: Sarah Swaziland MD,  November 22, 2009 12:07 PM    Additional Follow-up for Phone Call Additional follow up Details #2::    gave her md response. she is ok with this form Follow-up by: Golden Circle RN,  November 22, 2009 2:28 PM

## 2010-06-27 NOTE — Miscellaneous (Signed)
Summary: heart pounding   Clinical Lists Changes c/o dizzy, fast heart rate "like a panic attack" feels like she has run too far. pain up back of neck "like a HA" thinks it may be her job & BOSS.  does not think it is her heart but she is very worried. she can be here in less than 15 minutes. will make her an appt. denies traditional signs of MI in women.Marland KitchenMarland KitchenGolden Circle RN  August 02, 2009 3:03 PM Pt admitted to [facility] to r/o MI Sarah Swaziland MD  August 03, 2009 8:54 AM
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
## 2010-06-27 NOTE — Letter (Signed)
Summary: Appointment - Reminder 2  [facility], Main Office  [address]   [place], [place] [address]   Phone: [phone]  Fax: [phone]     June 23, 2009 MRN: [id number]   [facility] [address] [place], [place]  [address]   Dear Ms. Hafner,  Our records indicate that it is time to schedule a follow-up appointment with Dr. Shirlee Latch. It is very important that we reach you to schedule this appointment. We look forward to participating in your health care needs. Please contact us at the number listed above at your earliest convenience to schedule your appointment.  If you are unable to make an appointment at this time, give [place] a call so we can update our records.  Sincerely,   Migdalia Dk [facility] Scheduling Team

## 2010-06-27 NOTE — Assessment & Plan Note (Signed)
Summary: Cardiology Nuclear Testing  Nuclear Med Background Indications for Stress Test: Evaluation for Ischemia, Post Hospital  Indications Comments: 08/03/09 chest pain,(-) enzymes  History: Echo, Myocardial Perfusion Study  History Comments: 3/10 Echo:EF=55-60%; 3/10 MPS:no ischemia, EF=73%.  Symptoms: Chest Pain, Chest Pain with Exertion, Chest Tightness, Chest Tightness with Exertion, DOE, Fatigue, Light-Headedness, Palpitations, Rapid HR, SOB  Symptoms Comments: Last episode of ZO:XWRU a.m.   Nuclear Pre-Procedure Cardiac Risk Factors: Family History - CAD, Hypertension, Lipids, Obesity Caffeine/Decaff Intake: None NPO After: 8:00 AM Lungs: Clear IV 0.9% NS with Angio Cath: 24g     IV Site: (R) Upper arm IV Started by: Irean Hong RN Chest Size (in) 38     Cup Size C     Height (in): 64 Weight (lb): 229 BMI: 39.45  Nuclear Med Study 1 or 2 day study:  1 day     Stress Test Type:  Stress Reading MD:  Marca Ancona, MD     Referring MD:  Marca Ancona, MD Resting Radionuclide:  Technetium 47m Tetrofosmin     Resting Radionuclide Dose:  11.0 mCi  Stress Radionuclide:  Technetium 66m Tetrofosmin     Stress Radionuclide Dose:  33.0 mCi   Stress Protocol Exercise Time (min):  5:00 min     Max HR:  151 bpm     Predicted Max HR:  173 bpm  Max Systolic BP: 167 mm Hg     Percent Max HR:  87.28 %     METS: 7.0 Rate Pressure Product:  04540    Stress Test Technologist:  Rea [employer] CMA-N     Nuclear Technologist:  Burna Mortimer Deal RT-N  Rest Procedure  Myocardial perfusion imaging was performed at rest 45 minutes following the intravenous administration of Myoview Technetium 41m Tetrofosmin.  Stress Procedure  The patient exercised for five minutes.  The patient stopped due to fatigue and dyspnea.  O2 Sats were obtained during exercise and remained around 98% and 99%.   She denied any significant chest pain,  but did c/o a "pinching" immediately post exercise between breast and  collar bone.  There were no diagnostic ST-T wave changes.  Myoview was injected at peak exercise and myocardial perfusion imaging was performed after a brief delay.  QPS Raw Data Images:  Normal; no motion artifact; normal heart/lung ratio. Stress Images:  NI: Uniform and normal uptake of tracer in all myocardial segments. Rest Images:  Uniform and normal uptake of tracer in all myocardial segments. Subtraction (SDS):  There is no evidence of scar or ischemia. Transient Ischemic Dilatation:  .99  (Normal <1.22)  Lung/Heart Ratio:  .29  (Normal <0.45)  Quantitative Gated Spect Images QGS EDV:  94 ml QGS ESV:  24 ml QGS EF:  74 % QGS cine images:  Normal wall motion.    Overall Impression  Exercise Capacity: Fair exercise capacity. BP Response: Normal blood pressure response. Clinical Symptoms: Fatigue, dyspnea ECG Impression: Insignificant upsloping ST segment depression. Overall Impression: Normal stress nuclear study.

## 2010-06-27 NOTE — Assessment & Plan Note (Signed)
Summary: 2:30 wi appt/rescheduled from [facility]ith/gall bladder attack/eo   Vital Signs:  Patient profile:   50 year old female Weight:      224 pounds Temp:     98.5 degrees F oral Pulse rate:   93 / minute Pulse rhythm:   regular BP sitting:   127 / 80  (left arm) Cuff size:   regular  Vitals Entered By: Loralee Pacas CMA (January 23, 2010 3:48 PM)  Primary Care Provider:  Sarah Swaziland MD   History of Present Illness: 50 yo female with abd pain.  Abd pain:  Pt is certain its her gallbladder though she has never had imaging done on this.  Pain has been present for years.  She gets pain RUQ and epigastric worse after eating fatty meals.  Pain used to be crampy and last a few hours but now has been lasting 8h.  She also has greasy stools that are brown.  Pain radiates through to back, no radiation to shoulder blade.  No fevers/chills, no vomiting, no SOB, no CP.  She drinks approx 1-2 shots every other weekend.    She has had an UGI series years ago that showed a small hiatal hernia and signs of gastritis.  Current Medications (verified): 1)  Prilosec 40 Mg  Cpdr (Omeprazole) .... As Needed 2)  Trazodone Hcl 50 Mg Tabs (Trazodone Hcl) .... Take 1-2 At Night For Sleep 3)  Magnesium .... Once Daily 4)  Fiber .... Two Times A Day 5)  Lisinopril 10 Mg Tabs (Lisinopril) .Marland Kitchen.. 1 By Mouth Daily For High Blood Pressure 6)  Alprazolam 0.5 Mg Tabs (Alprazolam) .Marland Kitchen.. 1 By Mouth Two Times A Day As Needed Anxiety 7)  Chromium Picolinate 500 Mcg Tabs (Chromium Picolinate) .... Once Daily 8)  Coq10 .... Once Daily 9)  Fluticasone Propionate 50 Mcg/act Susp (Fluticasone Propionate) .... 2 Sprays Each Nostril Daily 10)  Claritin 10 Mg Tabs (Loratadine) .Marland Kitchen.. 1 Tab By Mouth Daily 11)  Bupropion Hcl 150 Mg Xr24h-Tab (Bupropion Hcl) .... Take 1 Daily For Depression  Allergies (verified): 1)  ! Pravachol 2)  ! Celebrex 3)  ! * Estragen 4)  ! Crestor (Rosuvastatin Calcium) 5)  Codeine Phosphate (Codeine  Phosphate)  Review of Systems       See HPI  Physical Exam  General:  Well-developed,well-nourished,in no acute distress; alert,appropriate and cooperative throughout examination Eyes:  Xanthomas noted on eyelids. Lungs:  Normal respiratory effort, chest expands symmetrically. Lungs are clear to auscultation, no crackles or wheezes. Heart:  Normal rate and regular rhythm. S1 and S2 normal without gallop, murmur, click, rub or other extra sounds. Abdomen:  Obese. Bowel sounds positive,abdomen soft and non-tender without masses, organomegaly or hernias noted.  Negative Murphy sign.  No CVAT.   Impression & Recommendations:  Problem # 1:  ABDOMINAL PAIN, RIGHT UPPER QUADRANT (ICD-789.01) Assessment New DDx includes biliary colic, PUD, pancreatitis (hx hyperlipidemia/hypertriglyceridemia). Will check the below labs. U/S abdomen. RTC after US done to go over results. If U/S inconclusive would do HIDA. CT later down the road if considering pancreatitis.  Orders: [facility]- Est  Level 4 (99214) CBC w/Diff-[facility] (16109) Comp Met-[facility] (60454-09811) Amylase-[facility] (91478-29562) Lipase-[facility] (13086-57846) Triglycerides-[facility] (84478) H pylori-[facility] (96295) Ultrasound (Ultrasound)  Complete Medication List: 1)  Prilosec 40 Mg Cpdr (Omeprazole) .... As needed 2)  Trazodone Hcl 50 Mg Tabs (Trazodone hcl) .... Take 1-2 at night for sleep 3)  Magnesium  .... Once daily 4)  Fiber  .... Two times a day 5)  Lisinopril 10 Mg Tabs (Lisinopril) .Marland Kitchen.. 1 by mouth daily for high blood pressure 6)  Alprazolam 0.5 Mg Tabs (Alprazolam) .Marland Kitchen.. 1 by mouth two times a day as needed anxiety 7)  Chromium Picolinate 500 Mcg Tabs (Chromium picolinate) .... Once daily 8)  Coq10  .... Once daily 9)  Fluticasone Propionate 50 Mcg/act Susp (Fluticasone propionate) .... 2 sprays each nostril daily 10)  Claritin 10 Mg Tabs (Loratadine) .Marland Kitchen.. 1 tab by mouth daily 11)  Bupropion Hcl 150 Mg Xr24h-tab (Bupropion hcl) .... Take 1 daily  for depression  Patient Instructions: 1)  Good to meet you today, 2)  Ultrasound of your abdomen. 3)  Labwork. 4)  Come back to see me after your ultrasound is done to go over results and for further testing. 5)  -Dr. Karie Schwalbe.  Laboratory Results   Blood Tests   Date/Time Received: January 23, 2010 4:46 PM  Date/Time Reported: January 23, 2010 5:08 PM    H. pylori: negative Comments: ...............test performed by......Marland KitchenBonnie A. Swaziland, MLS (ASCP)cm

## 2010-06-27 NOTE — Assessment & Plan Note (Signed)
Summary: sore throat & ear pain/[place]/Ann Castillo   Vital Signs:  Patient profile:   50 year old female Height:      64 inches Weight:      227 pounds BMI:     39.11 Temp:     98.1 degrees F oral BP sitting:   124 / 78  (right arm) Cuff size:   large  Vitals Entered By: Tessie Fass CMA (October 31, 2009 10:22 AM) CC: sore throat, cough and ear ach x 3 days Is Patient Diabetic? No   Primary Care Provider:  Sarah Swaziland MD  CC:  sore throat and cough and ear ach x 3 days.  History of Present Illness: Ms. Flener comes in today for sore throat, cough, eara pain for 3 days.  States she always has a chronic cough that no one knows why.  Worsened the last 3 days though.  Hacking.  Nonproductive.  Making her throat sore.  Feels congested "in the back" but no runny nose.  Feels like a "lump in the back of my mouth".  Subjective fever and chills.  No nausea, vomitting, or diarrhea.  Does have itchy, watery eyes.    Allergies: 1)  ! Pravachol 2)  ! Celebrex 3)  ! * Estragen 4)  ! Crestor (Rosuvastatin Calcium) 5)  Codeine Phosphate (Codeine Phosphate)  Physical Exam  General:  obese, alert, NAD but coughing frequently. vitals reviewed.  Eyes:  conjuctiva clear and moist Ears:  External ear exam shows no significant lesions or deformities.  Otoscopic examination reveals clear canals, tympanic membranes are intact bilaterally without bulging, retraction, inflammation or discharge. Hearing is grossly normal bilaterally. Nose:  mucosa edematous and pale Mouth:  postnasal drip evident.  Lungs:  normal WOB, CTAB  Heart:  RRR without murmur Cervical Nodes:  No lymphadenopathy noted   Impression & Recommendations:  Problem # 1:  VIRAL URI (ICD-465.9) Assessment New  Likley viral URI.  STrep negative.  Claritin and flonase for symptomatic relief.  Patient declined cough medicine.  Her updated medication list for this problem includes:    Claritin 10 Mg Tabs (Loratadine) .Marland Kitchen... 1 tab by mouth  daily  Orders: [facility]- Est  Level 4 (04540)  Problem # 2:  COUGH (ICD-786.2) Assessment: New  Discussed most common cuases of chronic cough are smoking, GERD, allergies/rhinitis/asthma.  Never smoked. Has been on omedprazole for "years".  Given exam today and history, may have component of rhinitis.  ADvised to continue flonase and claritin even after feeling better to see if this helps her cough at all.  Patient agreeaable.   Orders: [facility]- Est  Level 4 (99214)  Complete Medication List: 1)  Prilosec 40 Mg Cpdr (Omeprazole) .... As needed 2)  Trazodone Hcl 50 Mg Tabs (Trazodone hcl) .... Take 1-2 at night for sleep 3)  Wellbutrin Sr 150 Mg Xr12h-tab (Bupropion hcl) .... Take 1 by mouth daily for depression 4)  Magnesium  .... Once daily 5)  Fiber  .... Two times a day 6)  Lisinopril 10 Mg Tabs (Lisinopril) .Marland Kitchen.. 1 by mouth daily for high blood pressure 7)  Alprazolam 0.5 Mg Tabs (Alprazolam) .Marland Kitchen.. 1 by mouth two times a day as needed anxiety 8)  Chromium Picolinate 500 Mcg Tabs (Chromium picolinate) .... Once daily 9)  Coq10  .... Once daily 10)  Fluticasone Propionate 50 Mcg/act Susp (Fluticasone propionate) .... 2 sprays each nostril daily 11)  Claritin 10 Mg Tabs (Loratadine) .Marland Kitchen.. 1 tab by mouth daily  Other Orders: Rapid Strep-[facility] (  16109)  Patient Instructions: 1)  Try the flonase and claritin to help both with your acute viral infection and then continue to take it to see if it helps your chronic cough. 2)  Give them at least 2 weeks to see if your cough improves. 3)  It was nice to meet you today! Prescriptions: CLARITIN 10 MG TABS (LORATADINE) 1 tab by mouth daily  #33 x 3   Entered and Authorized by:   Ardeen Garland  MD   Signed by:   Ardeen Garland  MD on 10/31/2009   Method used:   Print then Give to Patient   RxID:   [id number] FLUTICASONE PROPIONATE 50 MCG/ACT SUSP (FLUTICASONE PROPIONATE) 2 sprays each nostril daily  #1 x 3   Entered and Authorized by:   Ardeen Garland   MD   Signed by:   Ardeen Garland  MD on 10/31/2009   Method used:   Print then Give to Patient   RxID:   [id number]   Laboratory Results  Date/Time Received: October 31, 2009 10:31 AM  Date/Time Reported: October 31, 2009 10:40 AM   Other Tests  Rapid Strep: negative Comments: ...............test performed by......Marland KitchenBonnie A. Swaziland, MLS (ASCP)cm
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
## 2010-06-27 NOTE — Progress Notes (Signed)
Summary: phn msg   Phone Note Call from Patient Call back at Home Phone [phone]   Caller: Patient Summary of Call: pt is returning missed call Initial call taken by: De Nurse,  January 25, 2010 11:54 AM  Follow-up for Phone Call        spoke with pt and informed her of appt Follow-up by: Loralee Pacas CMA,  January 25, 2010 2:41 PM

## 2010-06-27 NOTE — Progress Notes (Signed)
Summary: refill   Phone Note Refill Request Call back at Home Phone [phone] Message from:  Patient  Refills Requested: Medication #1:  LISINOPRIL 10 MG TABS 1 by mouth daily for high blood pressure has enough for weekend  Initial call taken by: De Nurse,  Oct 07, 2009 10:14 AM    Prescriptions: LISINOPRIL 10 MG TABS (LISINOPRIL) 1 by mouth daily for high blood pressure  #31 x 3   Entered by:   Golden Circle RN   Authorized by:   Sarah Swaziland MD   Signed by:   Golden Circle RN on 10/07/2009   Method used:   Electronically to        [facility] [phone]* (retail)       [address]       [place], [place]  [address]       Ph: [phone] or [phone]       Fax: [phone]   RxID:   [id number]

## 2010-06-27 NOTE — Consult Note (Signed)
Summary: [facility]   Imported By: Clydell Hakim 02/14/2010 14:19:33  _____________________________________________________________________  External Attachment:    Type:   Image     Comment:   External Document

## 2010-06-29 NOTE — Assessment & Plan Note (Signed)
Summary: BP follow up/ls   Vital Signs:  Patient profile:   50 year old female Height:      65 inches Weight:      234.6 pounds BMI:     39.18 Temp:     98.1 degrees F oral Pulse rate:   69 / minute BP sitting:   136 / 83  (left arm) Cuff size:   regular  Vitals Entered By: Jimmy Footman, CMA (June 08, 2010 2:46 PM) CC: BP follow up. recurrant sore throat Is Patient Diabetic? No Pain Assessment Patient in pain? no        Primary Care Provider:  Breckin Zafar Swaziland MD  CC:  BP follow up. recurrant sore throat.  History of Present Illness: Mother died on 31-Oct-2022.  Now dealing with estate.  Sibling trouble.  Lots of stress now.  Not sleeping well.  Unable to go to sleep.  falls asleep around 3 am, has trouble getting up at 7.  No eating enough.  Problems with daughter, who is 67.  Exercising again.  Son inspired her.    Pt reports recurring sore throat for 1 month.  +cough, Felt that throat was swollen or bruised.  Concerned about throat cancer because of story on [employer] about someone coughing up a cancer.  Never smoker.  Drinks one q o week.  Denies congestion or post nasal drip.  Has been on PPI for several months in the past without  relief of cough.    Would like to avoid BP meds if possible.        Habits & Providers  Alcohol-Tobacco-Diet     Tobacco Status: never  Allergies: 1)  ! Pravachol 2)  ! Celebrex 3)  ! * Estragen 4)  ! Crestor (Rosuvastatin Calcium) 5)  Codeine Phosphate (Codeine Phosphate) PMH-FH-SH reviewed for relevance  Review of Systems       see HPI  Physical Exam  General:  Obese,well-nourished,in no acute distress; alert,appropriate and cooperative throughout examination Head:  Normocephalic and atraumatic without obvious abnormalities. No apparent alopecia or balding. Eyes:  No conjunctival injection or discharge Nose:  External nasal examination shows no deformity or inflammation. No discharge. Mouth:  Oral mucosa and oropharynx without  lesions or exudates.  Teeth in good repair.   Lungs:  Normal respiratory effort, chest expands symmetrically. Lungs are clear to auscultation, no crackles or wheezes. Heart:  Normal rate and regular rhythm. S1 and S2 normal without gallop, murmur, click, rub or other extra sounds.   Impression & Recommendations:  Problem # 1:  ESSENTIAL HYPERTENSION, BENIGN (ICD-401.1)  Pt would like to hold off on BP meds.  Continue healthy lifestyle.  Follow up 1 month, sooner as needed. Her updated medication list for this problem includes:    Lisinopril 10 Mg Tabs (Lisinopril) .Marland Kitchen... 1 by mouth daily for high blood pressure  Orders: [facility]- Est Level  3 (16109)  Problem # 2:  SORE THROAT (ICD-462)  Unclear etiology.  Likely viral URI.  Follow up if not better.  Orders: [facility]- Est Level  3 (60454)  Problem # 3:  COUGH (ICD-786.2)  Chronic.  Unclear etiology. Exam benign.  Has had chest xrays in past, trial of PPI.  If continues, consider ENT referal  Orders: [facility]- Est Level  3 (09811)  Complete Medication List: 1)  Prilosec 40 Mg Cpdr (Omeprazole) .... As needed 2)  Trazodone Hcl 50 Mg Tabs (Trazodone hcl) .... Take 1-2 at night for sleep 3)  Lisinopril 10 Mg Tabs (Lisinopril) .Marland KitchenMarland KitchenMarland Kitchen  1 by mouth daily for high blood pressure 4)  Alprazolam 0.5 Mg Tabs (Alprazolam) .Marland Kitchen.. 1 by mouth two times a day as needed anxiety 5)  Bupropion Hcl 300 Mg Xr24h-tab (Bupropion hcl) .Marland Kitchen.. 1 by mouth daily.  Patient Instructions: 1)  Please let us know if you want to see someone about the cough. 2)  We can hold off on the blood pressure medicine for now.  Try to check your blood pressure every few weeks.  If it is higher than 140/90, call us. 3)  Please come back and see me in 1 month.     Orders Added: 1)  [facility]- Est Level  3 [16109]

## 2010-06-29 NOTE — Assessment & Plan Note (Signed)
Summary: f/u,df   Vital Signs:  Patient profile:   50 year old female Height:      65 inches Weight:      235.7 pounds BMI:     39.36 Pulse rate:   76 / minute BP sitting:   119 / 84  (right arm)  Vitals Entered By: Arlyss Repress CMA, (May 11, 2010 1:35 PM) CC: refill meds. discuss antidepressant...not working. wellbutrin. Is Patient Diabetic? No Pain Assessment Patient in pain? no        Primary Care Provider:  Sarah Swaziland MD  CC:  refill meds. discuss antidepressant...not working. wellbutrin.Marland Kitchen  History of Present Illness: Pt reports her depression is worsening.  Especially bad around menses.  Feel irritated and numb about things.  Interfering with her interest in exercise.  +guilt over not exercising and gaining weight.  Plenty of stress at home - marriage, daughter, mother.  Trouble sleeping, low appetite, some sad feelings, usually 1 week to 6 days before her menses, which then lifts when her period starts.  decreased energy,  some concentration problems, + psychomotor agitation and retardation, No SI/HI. Denies hallucinations.    Wondering if she should change meds or increase dose.  Unsure if meds made a big difference.    also wondering if she needs to stay on lisinopril.  Wondering if her BP would be ok off meds.  Still with dyspnea going up stairs, but can walk on flat surface without problems and do cardio pump classes.  No chest pain.  Pt reports 12 lb weight gain since Thanksgiving.  Still active, but not exercising as much.  Reports she is eating 1200 - 1600 calories most days when she tracks, with occ 1800.  These are her regular days; she is not trying to change her nutrition.  Has had cortisol and thyroid tests with endocrine.  Reports she is not hungry often.  Watching sodium, drinking hibiscus tea with stevia.  Feels the weigth gain is due to thyroid or adrenal or some sort of hormone issue.    Eating less foods that bothered her stomach.  Thinks it may have  been her hernia that caused the pain.  Better now.  No constipation with the Dr. Neil Crouch drink. Denies rectal bleeding, but then states she does have constipation for 2 days prior to her menses, and has noted drops of rectal bleeding at those times.  Also reports she is concerned that she may be passing gas vaginally and wonders if there might be some sort of fistula.  However, she declined an exam today and says she will ask her Gyn about it.     Habits & Providers  Alcohol-Tobacco-Diet     Tobacco Status: never  Current Medications (verified): 1)  Prilosec 40 Mg  Cpdr (Omeprazole) .... As Needed 2)  Trazodone Hcl 50 Mg Tabs (Trazodone Hcl) .... Take 1-2 At Night For Sleep 3)  Lisinopril 10 Mg Tabs (Lisinopril) .Marland Kitchen.. 1 By Mouth Daily For High Blood Pressure 4)  Alprazolam 0.5 Mg Tabs (Alprazolam) .Marland Kitchen.. 1 By Mouth Two Times A Day As Needed Anxiety 5)  Bupropion Hcl 150 Mg Xr24h-Tab (Bupropion Hcl) .... Take 1 Daily For Depression  Allergies (verified): 1)  ! Pravachol 2)  ! Celebrex 3)  ! * Estragen 4)  ! Crestor (Rosuvastatin Calcium) 5)  Codeine Phosphate (Codeine Phosphate) PMH-FH-SH reviewed for relevance  Review of Systems       see HPI  Physical Exam  General:  Obese,in no acute distress; alert,appropriate  and cooperative throughout examination.  Vitals noted Lungs:  Normal respiratory effort, chest expands symmetrically. Lungs are clear to auscultation, no crackles or wheezes. Heart:  Normal rate and regular rhythm. S1 and S2 normal without gallop, murmur, click, rub or other extra sounds. Psych:  Nl grooming and dress.  Nl thought process and thought content without FOI or LOA.  Nl affect.  Non labile.  No apparent hallucinations.  Denies SI/HI   Impression & Recommendations:  Problem # 1:  DEPRESSION/ANXIETY (ICD-300.4)  Reviewed options for treatment with pt.  I feel CBT would be very helpful.  Pt agrees to consider.  She does not want a different med at this time.  In  fact, she would like a trial off meds to see if they are really helping.  She has many significant stressors in her life, most of which are  not likely to improve soon.  She contracts for safety.  Orders: [facility]- Est Level  3 (16109)  Problem # 2:  ESSENTIAL HYPERTENSION, BENIGN (ICD-401.1)  Pt would like to try and manage off meds.  We will do a trial and have pt follow up in 2 weeks.  If she is doing well, will encourage to continue with her nutrition and exercise.  If BPs elevated again, consider restarting meds. Her updated medication list for this problem includes:    Lisinopril 10 Mg Tabs (Lisinopril) .Marland Kitchen... 1 by mouth daily for high blood pressure  Orders: [facility]- Est Level  3 (99213)  Problem # 3:  OBESITY, UNSPECIFIED (ICD-278.00)  Pt feels that she is accurately aware of all calories she takes in (even including cough drops) and that she does not understand why she is gaining weight.  We will review her information from endocrine and see if there are other possibilities for her weight gain.    Orders: [facility]- Est Level  3 (60454)  Complete Medication List: 1)  Prilosec 40 Mg Cpdr (Omeprazole) .... As needed 2)  Trazodone Hcl 50 Mg Tabs (Trazodone hcl) .... Take 1-2 at night for sleep 3)  Lisinopril 10 Mg Tabs (Lisinopril) .Marland Kitchen.. 1 by mouth daily for high blood pressure 4)  Alprazolam 0.5 Mg Tabs (Alprazolam) .Marland Kitchen.. 1 by mouth two times a day as needed anxiety 5)  Bupropion Hcl 150 Mg Xr24h-tab (Bupropion hcl) .... Take 1 daily for depression  Patient Instructions: 1)  Let's try stopping the lisinopril and the Wellbutrin.  You can stop the lisinopril at once, but try taking the Wellbutrin every other day for three more doses.  If you feel worse, let me know.   2)  We will look at the records from Dr. Talmage Nap and see if we have any ideas.  3)  Please make an appt with the nurse clinic to check your blood pressure. 4)  You might think about cognitive behavioral therapy (CBT).     Orders  Added: 1)  [facility]- Est Level  3 [09811]

## 2010-06-29 NOTE — Assessment & Plan Note (Signed)
Summary: bp check/eo   Nurse Visit patient in for BP check as directed. As she was arriving at our office today she received a call from the Nursing Home where her mother is a patient in [place]. she is not doing well and feels that she may be dying.  patient states she felt very calm before this phone call but now feels very jittery inside. today is last day of wellbutrin 150 mg and she will go to 300 mg tomorrow. BP LA 150/88,  RA 146/88 pulse 80.  consulted with Dr. Jennette Kettle and she advises to restart lisinopril now. Patient advised but she really would like to stay off the med is possible and feels that her current situtation is causing the BP increase. Dr. Jennette Kettle advises may continue off lisinopril for now. appointment scheduled with Dr. Swaziland for 06/08/2010 for follow up. Theresia Lo RN  May 25, 2010 9:01 AM  Agree with above.  Pt likely will need to restart meds, but if she prefers to wait until next visit with me, this is fine. Sarah Swaziland MD  May 25, 2010 5:49 PM   Allergies: 1)  ! Pravachol 2)  ! Celebrex 3)  ! * Estragen 4)  ! Crestor (Rosuvastatin Calcium) 5)  Codeine Phosphate (Codeine Phosphate)  Orders Added: 1)  No Charge Patient Arrived (NCPA0) [NCPA0]

## 2010-06-29 NOTE — Progress Notes (Signed)
Summary: need to talk with pt before fill refill request  Medications Added BUPROPION HCL 300 MG XR24H-TAB (BUPROPION HCL) 1 by mouth daily.       Phone Note Outgoing Call   Call placed by: Sarah Swaziland MD,  May 23, 2010 8:59 AM Summary of Call: Received refill request for lisinopril and for wellbutrin.  At last visit, we had discussed stopping those meds.  Would like to talk to pt to see how things are going and what prompted the refill request.   Initial call taken by: Sarah Swaziland MD,  May 23, 2010 9:00 AM  Follow-up for Phone Call        Spoke with pt.  She is having more trouble sleeping, feels numb and doesn't care as much about things, less motivation to exercise or even get out of bed in the mornings.  Would like to restart meds.  Will start her wellbutrin 150 for a few days (has some at home) and then will increase to 300 mg daily. Has not checked her bp (we stopped meds last visit).  Is coming in 2 days for RN visit for bp check. Will check her bp today at pharmacy when she picks up her Wellbutrin.  If her BP at RN clinic is 140/90 - 160/105, she should restart her lisinopri and should make an appt with me in one month.  If it is higher than that, she should make a sooner appt with me and restart the lisinopril.  If it is lower, she does not need to restart her meds and can see me in 1 month. Follow-up by: Sarah Swaziland MD,  May 23, 2010 10:46 AM    New/Updated Medications: BUPROPION HCL 300 MG XR24H-TAB (BUPROPION HCL) 1 by mouth daily. Prescriptions: BUPROPION HCL 300 MG XR24H-TAB (BUPROPION HCL) 1 by mouth daily.  #60 x 0   Entered and Authorized by:   Sarah Swaziland MD   Signed by:   Sarah Swaziland MD on 05/23/2010   Method used:   Electronically to        [facility] [phone]* (retail)       [address]       [place], [place]  [address]       Ph: [phone] or [phone]       Fax: [phone]   RxID:   [id number]

## 2010-07-24 ENCOUNTER — Telehealth: Payer: Self-pay | Admitting: Family Medicine

## 2010-07-24 NOTE — Telephone Encounter (Signed)
Pt needs to know if she needs an appt to get refill on med?

## 2010-07-25 NOTE — Telephone Encounter (Signed)
Not sure which med she is requesting, but I would be fine refilling her BP meds or antidepressants as long as she is doing ok with them.  I would like to see her in the next month or so.

## 2010-07-26 ENCOUNTER — Other Ambulatory Visit: Payer: Self-pay | Admitting: *Deleted

## 2010-07-26 DIAGNOSIS — F418 Other specified anxiety disorders: Secondary | ICD-10-CM

## 2010-07-26 MED ORDER — BUPROPION HCL ER (XL) 300 MG PO TB24: 300.0000 mg | ORAL_TABLET | Freq: Every day | ORAL | Status: DC

## 2010-07-26 NOTE — Telephone Encounter (Signed)
Called pt & scheduled appt, pt said she spoke with RN this am & was told another MD was going to call in her meds since Dr. Swaziland was not avail.

## 2010-07-26 NOTE — Telephone Encounter (Signed)
Patient notified that rx was sent in this AM. She has appointment 08/10/2010.

## 2010-07-26 NOTE — Telephone Encounter (Signed)
Spoke with patient and she needs Wellbutrin refilled. Advised to call for appointment soon . MD needs to see her within next month. Consulted  Dr. Perley Jain and he ok's refill on Wellbutrin.

## 2010-08-10 ENCOUNTER — Ambulatory Visit (INDEPENDENT_AMBULATORY_CARE_PROVIDER_SITE_OTHER): Payer: BLUE CROSS/BLUE SHIELD | Admitting: Family Medicine

## 2010-08-10 ENCOUNTER — Encounter: Payer: Self-pay | Admitting: Family Medicine

## 2010-08-10 VITALS — BP 146/89 | HR 81 | Temp 98.6°F | Ht 64.0 in | Wt 234.9 lb

## 2010-08-10 DIAGNOSIS — E785 Hyperlipidemia, unspecified: Secondary | ICD-10-CM

## 2010-08-10 DIAGNOSIS — E669 Obesity, unspecified: Secondary | ICD-10-CM

## 2010-08-10 DIAGNOSIS — G4700 Insomnia, unspecified: Secondary | ICD-10-CM

## 2010-08-10 DIAGNOSIS — F329 Major depressive disorder, single episode, unspecified: Secondary | ICD-10-CM

## 2010-08-10 DIAGNOSIS — I10 Essential (primary) hypertension: Secondary | ICD-10-CM

## 2010-08-10 DIAGNOSIS — F341 Dysthymic disorder: Secondary | ICD-10-CM

## 2010-08-10 DIAGNOSIS — F418 Other specified anxiety disorders: Secondary | ICD-10-CM

## 2010-08-10 MED ORDER — LISINOPRIL 10 MG PO TABS: 10.0000 mg | ORAL_TABLET | Freq: Every day | ORAL | Status: DC

## 2010-08-10 MED ORDER — BUPROPION HCL ER (XL) 300 MG PO TB24: 300.0000 mg | ORAL_TABLET | Freq: Every day | ORAL | Status: DC

## 2010-08-10 NOTE — Patient Instructions (Signed)
We will restart the lisinopril. Please follow up with [place] for a nurse visit in 2 weeks.  Please also make a fasting appt with the lab around then. If you are doing fine, please come back and see me in 3 months.  Please check your blood pressure at least every few weeks at [facility] or wherever in the meantime.   I will give you card with Dr. Carola Rhine phone number.

## 2010-08-12 ENCOUNTER — Encounter: Payer: Self-pay | Admitting: Family Medicine

## 2010-08-12 NOTE — Assessment & Plan Note (Signed)
Pt very aware of calorie counting and she reports she eats a lot of carbs.  Knows the importance of exercise.  Does not want to see nutrition.

## 2010-08-12 NOTE — Assessment & Plan Note (Signed)
Due for lipids.  Return for fasting lipid panel.  Not on a statin currently and is resistant to taking one, but last lipid panel was fine.

## 2010-08-12 NOTE — Assessment & Plan Note (Signed)
Doing well on current doses of meds.  Will refer to Dr. Pascal Lux for evaluation and assistance with managing the overwhelming stress in her life.

## 2010-08-12 NOTE — Assessment & Plan Note (Addendum)
Pt with elevated BPs off of meds - will restart lisinopril today.  Check BMP in about 2 weeks and follow up for nurse visit in 2 weeks for bp check.  Pt currently exercising and watching calories.  A modest weight loss would likely help with HTN, and she is working on this.

## 2010-08-12 NOTE — Progress Notes (Signed)
Pt here to f/u HTN.  Not taking meds.  Does not want to take meds, but knows her BP is too high.  She reports readings of 140s-160s/90s-100s.  No chest pain or SOB.  Is having lots of stress at home.  Daughter Page is due to graduate, but making Ds and Fs.  Still with lots of discord with her.  One son is getting married and Loucile does not like her future daughter in Social worker.  She is still trying to settle her parents 'estate and having trouble sorting out all the taxes.    She is not exercising as much as she would like.  Reports drinking lots of water and still drinking Dr. Neil Crouch drink. Still frustrated with her weight, feels that nutritionist here cannot help her - "she said I was doing all the right things".    Lots of trouble with insomnia.  She lies in bed, and her" mind won't shut off", so she gets up and sits in den, but does not turn lights on or watch TV.  Rarely reads- usually just daydreams.  Her spouse snores and she gets angry at him for that.  Rarely drinks caffeine, and never after 1 pm.  Trazadone helps her stay asleep, but does not help her fall asleep.    Last cholesterol check was about 1 year ago when she was hospitalized for chest pain.   Does not feel depressed.  Wellbutrin is working well.  PE: Obese.  No distress.  Vitals noted.   CV: RRR no m/g/r Lungs: CTAB.  No wheeze or rales.

## 2010-08-12 NOTE — Assessment & Plan Note (Signed)
Pt aware of sleep hygeine techniques.  Does not like meds, but does take trazadone sometimes.  Insomnia likely due to stress in her life.  Refer to Dr. Pascal Lux.

## 2010-08-14 ENCOUNTER — Telehealth: Payer: Self-pay | Admitting: Psychology

## 2010-08-14 NOTE — Telephone Encounter (Signed)
Patient called on 3/16 and left VM.  Returned phone call today and left VM.  Will await follow-up.

## 2010-08-20 LAB — CARDIAC PANEL(CRET KIN+CKTOT+MB+TROPI)
CK, MB: 0.9 ng/mL (ref 0.3–4.0)
CK, MB: 1.1 ng/mL (ref 0.3–4.0)
CK, MB: 1.5 ng/mL (ref 0.3–4.0)
CK, MB: 2.1 ng/mL (ref 0.3–4.0)
Relative Index: 1.1 (ref 0.0–2.5)
Relative Index: 1.9 (ref 0.0–2.5)
Total CK: 104 U/L (ref 7–177)
Total CK: 112 U/L (ref 7–177)
Total CK: 51 U/L (ref 7–177)
Troponin I: 0.03 ng/mL (ref 0.00–0.06)
Troponin I: 0.05 ng/mL (ref 0.00–0.06)

## 2010-08-20 LAB — BASIC METABOLIC PANEL
BUN: 9 mg/dL (ref 6–23)
Chloride: 104 mEq/L (ref 96–112)
GFR calc non Af Amer: >60 mL/min (ref >60)
Glucose, Bld: 95 mg/dL (ref 70–99)
Potassium: 3.6 mEq/L (ref 3.5–5.1)
Sodium: 137 mEq/L (ref 135–145)

## 2010-08-20 LAB — CBC
HCT: 33.1 % — ABNORMAL LOW (ref 36.0–46.0)
Hemoglobin: 10.8 g/dL — ABNORMAL LOW (ref 12.0–15.0)
MCV: 77.7 fL — ABNORMAL LOW (ref 78.0–100.0)
Platelets: 416 10*3/uL — ABNORMAL HIGH (ref 150–400)
RBC: 4.26 MIL/uL (ref 3.87–5.11)
WBC: 8.4 10*3/uL (ref 4.0–10.5)

## 2010-08-20 LAB — LIPID PANEL
Cholesterol: 196 mg/dL (ref 0–200)
LDL Cholesterol: 118 mg/dL — ABNORMAL HIGH (ref 0–99)
Triglycerides: 172 mg/dL — ABNORMAL HIGH (ref <150)

## 2010-08-24 ENCOUNTER — Other Ambulatory Visit: Payer: BLUE CROSS/BLUE SHIELD

## 2010-08-24 ENCOUNTER — Ambulatory Visit (INDEPENDENT_AMBULATORY_CARE_PROVIDER_SITE_OTHER): Payer: BLUE CROSS/BLUE SHIELD | Admitting: *Deleted

## 2010-08-24 VITALS — BP 126/80 | HR 72

## 2010-08-24 DIAGNOSIS — I10 Essential (primary) hypertension: Secondary | ICD-10-CM

## 2010-08-24 DIAGNOSIS — E785 Hyperlipidemia, unspecified: Secondary | ICD-10-CM

## 2010-08-24 LAB — LIPID PANEL
HDL: 51 mg/dL (ref >39)
LDL Cholesterol: 178 mg/dL — ABNORMAL HIGH (ref 0–99)

## 2010-08-24 NOTE — Progress Notes (Signed)
Drew pt for a fasting lipid. 1sst. AC 

## 2010-08-24 NOTE — Progress Notes (Signed)
These values are fine.  Pt to continue meds (she is aware of how to take them).

## 2010-08-24 NOTE — Progress Notes (Signed)
Patient in office today for BP check and labs. BP checked manually with  large adult cuff. BP LA 120/80 and RA 126/80 pulse 72.  She  Is taking meds as directed. Will forward readings to MD.

## 2010-08-25 ENCOUNTER — Telehealth: Payer: Self-pay | Admitting: Psychology

## 2010-08-25 NOTE — Telephone Encounter (Signed)
Patient called to schedule initial appt.  Has been talking to Dr. Swaziland for some time.  Psychosocial stressors are ongoing and affecting health.  Scheduled for:  09/11/10 at 4:00

## 2010-08-28 ENCOUNTER — Telehealth: Payer: Self-pay | Admitting: Family Medicine

## 2010-08-28 ENCOUNTER — Encounter: Payer: Self-pay | Admitting: Family Medicine

## 2010-08-28 DIAGNOSIS — E785 Hyperlipidemia, unspecified: Secondary | ICD-10-CM

## 2010-08-28 LAB — URINE MICROSCOPIC-ADD ON

## 2010-08-28 LAB — COMPREHENSIVE METABOLIC PANEL
AST: 20 U/L (ref 0–37)
Albumin: 3.7 g/dL (ref 3.5–5.2)
Alkaline Phosphatase: 47 U/L (ref 39–117)
BUN: 13 mg/dL (ref 6–23)
CO2: 21 mEq/L (ref 19–32)
Chloride: 110 mEq/L (ref 96–112)
Creatinine, Ser: 0.96 mg/dL (ref 0.4–1.2)
GFR calc non Af Amer: >60 mL/min (ref >60)
Potassium: 3.9 mEq/L (ref 3.5–5.1)
Total Bilirubin: 0.5 mg/dL (ref 0.3–1.2)

## 2010-08-28 LAB — URINALYSIS, ROUTINE W REFLEX MICROSCOPIC
Ketones, ur: 15 mg/dL — AB
Ketones, ur: 15 mg/dL — AB
Nitrite: POSITIVE — AB
Nitrite: NEGATIVE
Protein, ur: NEGATIVE mg/dL
Specific Gravity, Urine: 1.03 (ref 1.005–1.030)
Urobilinogen, UA: 1 mg/dL (ref 0.0–1.0)
pH: 6 (ref 5.0–8.0)

## 2010-08-28 LAB — CBC
HCT: 31.4 % — ABNORMAL LOW (ref 36.0–46.0)
MCV: 76.9 fL — ABNORMAL LOW (ref 78.0–100.0)
Platelets: 302 10*3/uL (ref 150–400)
RBC: 4.09 MIL/uL (ref 3.87–5.11)
WBC: 9.7 10*3/uL (ref 4.0–10.5)

## 2010-08-28 LAB — DIFFERENTIAL
Basophils Absolute: 0 10*3/uL (ref 0.0–0.1)
Basophils Relative: 0 % (ref 0–1)
Eosinophils Relative: 1 % (ref 0–5)
Monocytes Absolute: 0.8 10*3/uL (ref 0.1–1.0)
Neutro Abs: 7.7 10*3/uL (ref 1.7–7.7)

## 2010-08-28 LAB — POCT PREGNANCY, URINE: Preg Test, Ur: NEGATIVE

## 2010-08-28 LAB — URINE CULTURE

## 2010-08-28 NOTE — Telephone Encounter (Signed)
Discussed recent cholesterol panel with pt.  She declines statin therapy, even with her risk factors of HTN and + FH.  She plans to increase her exercise and watch her nutrition (husband has been bringing home [employer] doughnuts frequently).   Pt is scheduled to see Dr. Pascal Lux.  Doing ok with lots of stressors.   Follow up labs in 2 months, and with me after she starts working with Dr. Pascal Lux.

## 2010-09-11 ENCOUNTER — Ambulatory Visit (INDEPENDENT_AMBULATORY_CARE_PROVIDER_SITE_OTHER): Payer: BLUE CROSS/BLUE SHIELD | Admitting: Psychology

## 2010-09-11 DIAGNOSIS — F329 Major depressive disorder, single episode, unspecified: Secondary | ICD-10-CM

## 2010-09-12 NOTE — Assessment & Plan Note (Signed)
Buffi is neatly groomed and appropriately dressed.  She maintains good eye contact and is cooperative and attentive.  Her speech is normal in tone, fast in rate and normal in rhythm.  Mood is reported as mostly irritable with some mild depression.  Her affect is variable, within normal limits and appropriate to speech content. Thought process is logical and goal directed.  Denied suicidal or homicidal ideation.  Does not appear to be responding to any internal stimuli.  Able to maintain train of thought and concentrate on the questions.  Judgment and insight are average to above.  She describes herself as upbeat and gregarious with a short fuse.    She is very ambivalent about coming to therapy.  Either is is an excuse or she is serious about just needing to check it off of her list of things to do.  I suggested that while her level of stress may not change, her reaction to it could use some work that might lead to increased motivation and shift in mood.  She is a talker and had a lot of stories to tell.  Did not direct interview to specific symptomatology so a clear diagnosis beyond "depression" will be deferred.  Focused largely on rapport today and I think that we established a good one.  With what she has on her plate and her ambivalence about the therapy in general, it would be a challenge to commit to therapy and make good use of it.  We decided to let her think about it and she will call if she decides she wants to pursue.  Will let Dr. Swaziland know.
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
## 2010-10-10 NOTE — Assessment & Plan Note (Signed)
[facility]                            CARDIOLOGY OFFICE NOTE   NAME:Ann Castillo, Ann Castillo                   MRN:          [id number]  DATE:07/20/2008                            DOB:          1961-01-28    PRIMARY CARE PHYSICIAN:  Johney Maine, M.D., at [facility].   HISTORY OF PRESENT ILLNESS:  This is a 50 year old with a history of  hypertension and hyperlipidemia who presents to Cardiology Clinic for  assessment of shortness of breath and chest pain with exertion.  The  patient does tell me that over the last 6 months, she has gained about  45 pounds.  She is not sure why she has gained that much weight.  She  says she has been exercising and has not been eating more.  However, she  has been through a lot of stress recently.  She put her parents in a  nursing home, they live up in [place].  She says that she had a cortisol  level checked by her primary care physician, which was normal per her  report.  She additionally has begun developing swelling in the legs.  This has resolved after she was started on 20 mg daily of Lasix.  She  does tend to be active.  She walks several miles on a track and rides 7  miles on the exercise bike about 3 times a week.  She does do this at a  slow pace.  She is not short of breath with these activities.  However,  she does tell me that if she climbs a flight of steps, she does become  quite significantly short of breath and if she uses an elliptical  machine, she becomes significantly short of breath.  She also tells me  that the last time she tried to use the elliptical machine, she became  very short of breath, and had a quite significant episode of chest  pressure radiating up into her neck and down her left arm.  This  resolved completely with rest.  She additionally has had episodes of  chest pressure when walking up a steep hill and the chest pressure is  accompanied by diaphoresis.  She has  had several of these episodes in  the past few months, always with exertion and always recovering when she  rests.  The last time this occurred was the last time when she rode an  elliptical machine, which was actually about a month ago.  She has to  sleep propped up, but that is because of her reflux and not because of  shortness of breath.  She denies orthopnea or PND.  The patient's LDL is  quite high at 208.  She does tell me that she had taken pravastatin in  the past back at some point last year.  She took pravastatin for a  couple of weeks, and then she developed leg and arm myalgias.  Because  of the myalgias, she took Celebrex.  About 2 hours after taking her  first dose of Celebrex, she developed itchiness and shortness of breath  consistent with an  anaphylactic reaction.  She stopped the Celebrex and  pravastatin completely after that point in time; and has not been on  cholesterol medication since.   PAST MEDICAL HISTORY:  1. Hypertension.  2. Obesity.  3. Hyperlipidemia.  4. Migraines.  5. Bilateral leg edema.  6. Gastroesophageal reflux disease.  7. Pulmonary embolus in 2006.  The patient did develop this in the      setting of being on Yaz oral contraceptive pill.  She was on      Coumadin for 6 months.   SOCIAL HISTORY:  The patient is a IT consultant, working for a divorce  attorney.  She does have teenagers at home.  She has never smoked.  Rarely drinks alcohol.   FAMILY HISTORY:  The patient's father had several MIs between the age of  78 and 53.   REVIEW OF SYSTEMS:  Negative except as noted in the history of present  illness.   LABORATORY DATA:  Most recent labs from February 2010, hematocrit 36.4.  Creatinine 0.6, LDL 208, triglycerides 132, and HDL 67.   MEDICATIONS:  1. Lasix 20 mg daily.  2. Omeprazole 40 mg daily.   IMAGING:  An EKG was reviewed.  It shows normal sinus rhythm.  This is a  normal EKG.   PHYSICAL EXAMINATION:  VITAL SIGNS:  Blood  pressure is 128/74, heart  rate 76 and regular, and weight is 233 pounds.  GENERAL:  This is an obese female in no apparent distress.  NEUROLOGICAL:  Alert and oriented x3.  Normal affect.  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  NECK:  There is no JVD.  There is no thyromegaly or thyroid nodule.  CARDIOVASCULAR:  Heart, regular S1 and S2.  No S3.  No S4.  There is no  murmur.  There is trace ankle edema only.  There are 2+ posterior tibial  pulses bilaterally.  There is no carotid bruits.  ABDOMEN:  Obese, soft, and nontender.  No hepatosplenomegaly.  EXTREMITIES:  No clubbing or cyanosis.  SKIN:  Normal exam  MUSCULOSKELETAL:  Normal exam.  LUNGS:  Clear to auscultation bilaterally.  Normal respiratory effort.   ASSESSMENT AND PLAN:  This is a 50 year old with hypertension,  hyperlipidemia, and family history of coronary artery disease who  presents for evaluation of dyspnea on exertion as well as episodes of  exertional chest pain.   1. Exertional chest pain.  The patient has had 4-5 episodes of quite      worrisome exertional chest pain over the last few months.  Mainly,      she develops chest pressure, sweatiness, and shortness of breath      when walking up a steep hill or using the elliptical machine at a      rapid rate.  Every time this has happened, it has developed with      exertion and resolved with rest.  It has never occurred at rest.      The last time was about a month ago.  The patient does have a      number risk factors for coronary artery disease including quite      high cholesterol, family history, and borderline hypertension.      Given her risk factors, I do think it is reasonable to do a stress      test.  We will set her up to do a treadmill Myoview.  I also have      asked her to  take aspirin 81 mg daily.  2. Hyperlipidemia.  The patient's LDL is quite high at 208.  She did      have myalgias on pravastatin.  She had what sounds like  an      anaphylactic reaction about 2 hours after taking Celebrex because      of the myalgias from pravastatin.  I do think the anaphylactic      reaction sounds likes it was from the Celebrex.  I am quite worried      about her high LDL cholesterol in the setting of her episodes of      chest pain with exertion.  I did recommend that we try a different      cholesterol medicine.  I think that taking Crestor 5 mg every other      day may allow [place] to avoid the muscle pains.  In addition, I will      have her take Coenzyme Q10 along with the statin.  There is some      evidence, although it is not very strong, that Coenzyme Q10 can      decrease the risk of statin-induced myalgias.  I did tell the      patient that if she develops any recurrent muscle pain whatsoever,      to stop taking the statin.  3. Borderline hypertension.  The patient's blood pressure is normal      today.  She is taking Lasix 20 mg once a day.  4. Dyspnea on exertion.  The patient does have significant dyspnea on      exertion mainly with moderate to heavy exercise.  This has been      going on for several months now.  She does not appear volume      overloaded on exam.  She did have some lower extremity edema, but      this could be a result of venous stasis from her weight.  I do      think, however, it is reasonable to get an echocardiogram.  We will      also check a BNP, though in someone who is obese, the BNP could      possibly be false negative.  5. We will see the patient back in followup in 2 weeks after the      testing is done.     Marca Ancona, MD  Electronically Signed    DM/MedQ  DD: 07/20/2008  DT: 07/21/2008  Job #: [id number]   cc:   Johney Maine, M.D.

## 2010-10-10 NOTE — Discharge Summary (Signed)
NAMEALEXINE, Ann Castillo            ACCOUNT NO.:  [id number]   MEDICAL RECORD NO.:  [id number]          PATIENT TYPE:  OBV   LOCATION:  3737                         FACILITY:  [facility]   PHYSICIAN:  Johney Maine, M.D.   DATE OF BIRTH:  June 03, 1960   DATE OF ADMISSION:  11/14/2006  DATE OF DISCHARGE:  11/15/2006                               DISCHARGE SUMMARY   NOTE:  Please note, this is a 23-hour observation.   ADMISSION DIAGNOSES:  1. Chest pain/shortness of breath.  2. History of pulmonary embolism.   DISCHARGE DIAGNOSES:  1. Gastritis.  2. Gastroesophageal reflux disease.  3. Hiatal hernia.  4. History of pulmonary embolism.   DISCHARGE MEDICATIONS:  Omeprazole 20 mg p.o. b.i.d.   FOLLOWUP:  The patient will return to the [facility] with  Dr. Karn Pickler and was instructed to call for an appointment.  She will  follow up at the [facility] on Monday, November 18, 2006, for H.  pylori lab drawn.   HOSPITAL COURSE:  This is a 50 year old female who is admitted from the  [facility] for shortness of breath and chest pain.  The presentation was very similar to the patient's pulmonary embolism  presentation that occurred a couple of years ago.  Because of the  concern and history and the patient's symptoms, she was admitted for a  rule out MI, as well as rule out pulmonary embolism.  Please see  following details:  1. Chest pain and shortness of breath:  We were very concerned that      this patient had another pulmonary embolism given her clinical      history as well as her symptom presentation.  We got a CT angiogram      of the chest which was negative for pulmonary embolus and no acute      abnormality of the thoracic aorta.  Her cardiac enzymes remained      negative.  Because of this and when further discussing the      patient's chest pain with the patient, we decided it might have      been a GI problem.  Please see below for details.   The patient did      not have a pulmonary embolus, therefore we did not start      anticoagulation.  2. Gastroesophageal reflux disease/gastritis:  Given the fact that the      patient ruled out for MI and some of her symptoms seemed to be more      along the lines and signs of GI, for example - burning chest pain,      we consulted GI and obtained an upper GI series which showed a      small hiatal hernia, marked reflux, as well as gastritis.  Because      of this, we started the patient on omeprazole 20 mg p.o. b.i.d.  We      may increase this if needed if the patient's symptoms are not      controlled.  She will follow up with Dr. Karn Pickler in the outpatient  setting to discuss whether this medication needs to be changed.      She does not need further GI work-up at this time.  She does not      need any further medications at this time.  3. History of pulmonary embolism:  No further treatment needed as we      ruled the patient out for pulmonary embolism.   IMAGING:  1. CT angiogram which was negative for PE.  However, it did show      asymmetric prominence of the left lobe of the thyroid, unchanged      compared to 2005.  There is no acute abnormality of the thoracic      aorta.  No mediastinal nor hilar adenopathy.  2. Upper GI series:  Remarkable for small hiatal hernia, marked      reflux, prominent area of gastric mucosal pattern and gastritis.   PERTINENT LABORATORY DATA:  Comprehensive metabolic panel was within  normal limits.  Sodium 138, potassium 3.8, chloride 108, CO2 22, glucose  90, BUN 13, creatinine 0.71.  Liver enzymes - AST 14, ALT 14, albumin  3.5, bilirubin 0.8, alkaline phosphatase 31, calcium 8.6.  CBC:  White  blood cells 10.9, hemoglobin 12.9, hematocrit 37.3, platelets 280.  Cardiac enzymes:  Troponin 0.01, 0.01, less than 0.01 and they remained  negative throughout her hospital stay.  PT INR:  The INR is 1, PT 12.9.            ______________________________  Johney Maine, M.D.     JT/MEDQ  D:  11/17/2006  T:  11/17/2006  Job:  [id number]

## 2010-10-13 NOTE — Discharge Summary (Signed)
Ann Castillo, Ann Castillo            ACCOUNT NO.:  [id number]   MEDICAL RECORD NO.:  [id number]          PATIENT TYPE:  INP   LOCATION:  2032                         FACILITY:  [facility]   PHYSICIAN:  Roddie Mc B. Vanstory, M.D.DATE OF BIRTH:  June 27, 1960   DATE OF ADMISSION:  02/14/2004  DATE OF DISCHARGE:  02/16/2004                                 DISCHARGE SUMMARY   ADDENDUM:  job #[id number]   HOSPITAL COURSE:  #3 - HYPERCHOLESTEROLEMIA:  Fasting lipid panel showed  total cholesterol 216, triglycerides 235, and LDL 124.  Patient has several  risk factors for heart disease including obesity and high cholesterol.  Patient was started on Lipitor.  Will follow as an outpatient.       MBV/MEDQ  D:  02/16/2004  T:  02/17/2004  Job:  [id number]

## 2010-10-13 NOTE — H&P (Signed)
NAME:  Ann Castillo, Ann Castillo                      ACCOUNT NO.:  [id number]   MEDICAL RECORD NO.:  [id number]                   PATIENT TYPE:  INP   LOCATION:  2032                                 FACILITY:  [facility]   PHYSICIAN:  Nani Gasser, M.D.            DATE OF BIRTH:  [birth year]   DATE OF ADMISSION:  02/14/2004  DATE OF DISCHARGE:                                HISTORY & PHYSICAL   CHIEF COMPLAINT:  Chest pain with respirations.   HISTORY OF PRESENT ILLNESS:  This is a 50 year old, white female with no  significant past medical history who noticed pain in the right breast area,  but then radiated into her right shoulder and neck yesterday evening.  She  said the pain was worse with respiration and described it as a 7-8/10 and  sharp in nature.  She says it was worse when she lies flat.  She says she  never had this before and has also noticed that over the past 2-3 weeks, she  has had some heart poundings going up three to four steps.  She says this is  unusual for her because she has been walking for about 1 hour during her  lunch hours without any difficulty up until the past couple of weeks.  She  also noticed some left-sided lower quadrant/hip pain a few days ago and some  lower extremity edema for the past 2 days.   REVIEW OF SYSTEMS:  CONSTITUTIONAL:  No fevers, no weight changes.  HEENT:  No vision or hearing changes.  No upper respiratory symptoms.  Some nasal  stuffiness.  PULMONARY:  Pain with inspiration as above.  She feels that she  is taking much more shallow breaths.  CARDIOVASCULAR:  Please see above.  GASTROINTESTINAL:  No nausea, vomiting, diarrhea or constipation.  NEUROLOGIC:  Several headaches in the past couple of days.  She has also  noticed some cramping and pain in her left calf.  MUSCULOSKELETAL:  She  feels that her right hand is a little more weak today and tingling in the  right fingers.   PAST MEDICAL HISTORY:  1.  Coxodynia.  2.  Insulin  resistance.  3.  History of two miscarriages.   MEDICATIONS:  1.  Yasmin, which she stopped 2 weeks ago.  2.  Celebrex, she stopped 10 days ago.  3.  Calcium supplement.  4.  Chromium supplement.  5.  Magnesium supplement.  6.  B12.   FAMILY HISTORY:  Father has had five MIs.  The first was at age 64.  He also  has diabetes type 2.   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  She denies ever smoking and reports drinking alcohol  once or twice a month and no drug history use.   ALLERGIES:  No known drug allergies.  She says that she was told she had  some reaction  to CODEINE as a child.   PAST SURGICAL HISTORY:  None.   PHYSICAL EXAMINATION:  VITAL SIGNS:  Temperature 98.3, heart rate 98,  respirations 44, on repeat exam 20, 100% on room air and blood pressure was  115/56.  GENERAL:  In no acute distress.  Very pleasant.  She is sitting on the edge  of the bed talking without difficulty.  HEENT:  Head is atraumatic, normocephalic, extraocular movements intact.  Pupils equal round and reactive to light.  TMs are clear bilaterally.  Oropharynx is clear.  Mucous membranes are dry.  NECK:  No lymphadenopathy, no thyromegaly.  CARDIAC:  Regular rate and rhythm with decreased S1, S2 sounds.  Radial  pulses are 2+ bilaterally, dorsal pedal pulses were 2+ bilaterally with no  lower extremity edema detectable today.  PULMONARY:  Clear to auscultation bilaterally with good effort.  ABDOMEN:  Positive bowel sounds, soft, nontender, nondistended.  NEUROLOGIC:  Cranial nerves 2-12 grossly intact.  Gross motor intact.  MUSCULOSKELETAL:  4/5 in hand strength and grip as well as flexion/extension  and 5/5 for shoulder flexion/extension.   LABORATORY DATA AND X-RAY FINDINGS:  White count 12.4, hemoglobin 14.1,  platelets 361,000.  Sodium 135, potassium 3.9, chloride 113, BUN 9,  creatinine 0.7 with a glucose of 107.  PT 12.4, INR 0.9, PTT  21.  Her D-  dimer was elevated at 2.35.  Her pH was 7.488 and pCO2 23.  AST 21, ALT 17,  Alk phos 41, total bilirubin 0.5, total protein 7.1, albumin 3.6.  Her  myoglobin was 37, CK-MB less than 1 and troponin less than 0.05.   Chest CT revealed bilateral lower lobe PEs, the right being lobar, the left  being segmental.  She also had some patchy atelectasis/infiltrates in the  lower lobes bilaterally.   ASSESSMENT/PLAN:  1.  This is a 50 year old with bilateral pulmonary embolus.  It is unclear      of the etiology since this patient does not appear to have any clear      risk factors except for her oral contraceptives which she says she      discontinued 2 weeks ago.  Will check a urine pregnancy to make sure      this is not the case.  She has been started on a heparin drip pharmacy      to dose.  Will also start Coumadin.  She will need therapy for at least      6 months so there is an unclear cause for her pulmonary embolus.  The      patient is extremely stable, thus I feel that she would do well on a      monitored telemetry bed.  She does not need intensive care bed at this      time.  She can be up as tolerated, but with no extreme exertion.  2.  Insulin resistance.  We can continue to monitor this.  Her blood glucose      was 107 today, which is quite normal.  3.  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  These often can indicate      hypercholesterolemia.  Will check a fasting lipid panel in the morning.       CM/MEDQ  D:  02/14/2004  T:  02/15/2004  Job:  [id number]

## 2010-10-13 NOTE — Discharge Summary (Signed)
Ann Castillo, Ann Castillo            ACCOUNT NO.:  [id number]   MEDICAL RECORD NO.:  [id number]          PATIENT TYPE:  INP   LOCATION:  2032                         FACILITY:  [facility]   PHYSICIAN:  Roddie Mc B. Vanstory, M.D.DATE OF BIRTH:  02/12/1961   DATE OF ADMISSION:  02/14/2004  DATE OF DISCHARGE:  02/16/2004                                 DISCHARGE SUMMARY   DISCHARGE DIAGNOSES:  1.  Bilateral pulmonary emboli.  2.  Obesity.  3.  Hyperlipidemia.  4.  Migraines.  5.  Dyscoccygodynia.   DISCHARGE MEDICATIONS:  1.  Lipitor 40 mg p.o. q.h.s.  2.  Midrin one or two caps q.4h. p.r.n. migraines.  3.  Desipramine 50 mg p.o. daily.  4.  Lovenox 100 mg subcutaneous daily x7 days.  5.  Coumadin 5 mg p.o. daily.   HOSPITAL COURSE:  PROBLEM #1 -  BILATERAL PULMONARY EMBOLI:  The patient is  a 50 year old who presented with right-sided shortness of breath.  Spiral CT  showed bilateral pulmonary embolus.  The patient denied DVT and history of  travel, calf pain.  The patient was started on heparin and Coumadin and  serial PT and INRs were checked.  Family history revealed patient has had  two miscarriages with no further work-up and mother who had several  miscarriages.  No coagulopathy panel was done.  The patient could have this  done as an outpatient.  The patient was discharged on seven-day course of  Lovenox with INR check at [facility] on February 17, 2004.   PROBLEM #2 - HEADACHES:  The patient had a migraine on her stay.  She was  given Midrin which improved the pain but when pain returned she was given  MSIR.  The patient has chronic history of migraines and reports that this  headache was no different than the headaches with photophobia and eye pain  flashes but she sees visually.   CONDITION ON DISCHARGE:  Stable.   DISPOSITION:  Discharge to home.   FOLLOW UP:  [facility],  at 10 a.m. and lab to check  PT/INR on February 17, 2004.   PROCEDURE:  Chest x-ray and CT were only studies performed.       MBV/MEDQ  D:  02/16/2004  T:  02/17/2004  Job:  [id number]

## 2010-11-07 ENCOUNTER — Other Ambulatory Visit: Payer: Self-pay | Admitting: Family Medicine

## 2010-11-07 NOTE — Telephone Encounter (Signed)
Refill request

## 2010-11-30 ENCOUNTER — Telehealth: Payer: Self-pay | Admitting: Family Medicine

## 2010-11-30 NOTE — Telephone Encounter (Signed)
Is having similar problems with urinating as she had several years ago.  The problem is, she is in [place] and is concerned about going to an Urgent Care where they don't know of her case.  She has taken a medication in the passed that helped her to urinate and needs some advice.  No fever or feeling ill, just some pain and an incredible urge to urinate and cannot.

## 2010-11-30 NOTE — Telephone Encounter (Signed)
Told patient that we would not prescribe anything w/o seeing her.  Encouraged her to go to a local [facility] for evaluation and treatment.  Patient agreeable.

## 2010-11-30 NOTE — Telephone Encounter (Signed)
Agree.  She should be evaluated before any meds prescribed.

## 2011-01-01 ENCOUNTER — Ambulatory Visit (INDEPENDENT_AMBULATORY_CARE_PROVIDER_SITE_OTHER): Payer: BLUE CROSS/BLUE SHIELD | Admitting: Family Medicine

## 2011-01-01 ENCOUNTER — Encounter: Payer: Self-pay | Admitting: Family Medicine

## 2011-01-01 VITALS — BP 126/80 | HR 80 | Temp 98.0°F | Ht 64.0 in | Wt 245.8 lb

## 2011-01-01 DIAGNOSIS — M2550 Pain in unspecified joint: Secondary | ICD-10-CM

## 2011-01-01 DIAGNOSIS — E785 Hyperlipidemia, unspecified: Secondary | ICD-10-CM

## 2011-01-01 DIAGNOSIS — F341 Dysthymic disorder: Secondary | ICD-10-CM

## 2011-01-01 DIAGNOSIS — I10 Essential (primary) hypertension: Secondary | ICD-10-CM

## 2011-01-01 DIAGNOSIS — G4700 Insomnia, unspecified: Secondary | ICD-10-CM

## 2011-01-01 DIAGNOSIS — L678 Other hair color and hair shaft abnormalities: Secondary | ICD-10-CM | POA: Insufficient documentation

## 2011-01-01 DIAGNOSIS — L680 Hirsutism: Secondary | ICD-10-CM

## 2011-01-01 DIAGNOSIS — E669 Obesity, unspecified: Secondary | ICD-10-CM

## 2011-01-01 LAB — POCT SEDIMENTATION RATE: POCT SED RATE: 28 mm/hr — AB (ref 0–22)

## 2011-01-01 LAB — TESTOSTERONE: Testosterone: 55.51 ng/dL (ref 10–70)

## 2011-01-01 LAB — TSH: TSH: 0.617 u[IU]/mL (ref 0.350–4.500)

## 2011-01-01 MED ORDER — TRAZODONE HCL 50 MG PO TABS: 50.0000 mg | ORAL_TABLET | Freq: Every day | ORAL | Status: DC

## 2011-01-01 MED ORDER — OMEPRAZOLE 40 MG PO CPDR: 40.0000 mg | DELAYED_RELEASE_CAPSULE | ORAL | Status: DC | PRN

## 2011-01-01 NOTE — Assessment & Plan Note (Signed)
Check testosterone level today.  

## 2011-01-01 NOTE — Assessment & Plan Note (Signed)
Reviewed sleep hygiene.  Pt apparently ruminates at night, and could benefit from CBT.  However, she is not interested.  Pt with multiple stressors in her life that are unlikely to improve.  Husband's snoring also likely contributing factor, but he is unwilling to seek medical treatment.

## 2011-01-01 NOTE — Progress Notes (Signed)
  Subjective:    Patient ID: Ann Castillo, female    DOB: [birth year], 51 y.o.   MRN: [id number]  HPI  Pt here for exam with several concerns: 1)Feels her hormones/thyroid are messed up.  Notes facial hair present since late teens, but worsening, throat pain, sleep problems, menses q 21 days, but last one last one lasted 9 days (usually 2-3), feels waves of depression mid-cycle, continues to be irritable, and no weight loss for 3 years.  Denies contstipation or diarrhea.  Tried OTC progesterone cream suggested by Dr. Neil Crouch without relief.  2) Joint pain- worst in R wrist, worsened by typing, lifting weights, sewing, but hurts "all the time".  Pain also in B hips when getting out of chair, knees when they have been straight for a long time, shoulders.  Hard to bend knees, but no locking.  Joint pain started when she took statin.  Denies numbness or tingling.  No meds for pain  3)Insomnia- Has nights where she does not sleep more than 1-2 hours.  Lies in bed and either daydreams or ponders problems in her life.  Daydreaming helps her sleep.  Also uses Melatonin, Trazadone.  Denies ETOH.  Notes the small of her back "feels like a storm" and she needs to move around in bed.  Husband also snores and this awakens her.  Reports she had a routine for bedtime.  Insomnia worse if she goes to exercise class in the evening.  Does get sunshine exposure during the day.  Gets gyn and breast exams at OB/gyn.  Has orthopedist.    Pt taking melatonin 3 mg prn.  Unable to add to med list.    Review of Systems No chest pain, SOB,      Objective:   Physical Exam  Nursing note and vitals reviewed. Constitutional: No distress.       Obese.  HENT:  Head: Normocephalic and atraumatic.  Mouth/Throat: Oropharynx is clear and moist. No oropharyngeal exudate.  Eyes: Conjunctivae are normal. Right eye exhibits no discharge. Left eye exhibits no discharge. No scleral icterus.  Neck: Normal range of motion. Neck  supple. No tracheal deviation present. No thyromegaly present.  Cardiovascular: Normal rate, regular rhythm and normal heart sounds.  Exam reveals no gallop and no friction rub.   No murmur heard. Pulmonary/Chest: No respiratory distress. She has no wheezes. She has no rales.  Abdominal: Soft. She exhibits no distension and no mass. There is no tenderness. There is no rebound and no guarding.  Lymphadenopathy:    She has no cervical adenopathy.  Skin: Skin is warm and dry. She is not diaphoretic.  Psychiatric: She has a normal mood and affect. Her behavior is normal. Judgment and thought content normal.          Assessment & Plan:

## 2011-01-01 NOTE — Assessment & Plan Note (Signed)
On meds.  Declined changing in attempt to address premenstrual dysphoric disorder.  Has tried SSRI in past with intolerable side effects.  Feels she is doing OK with wellbutrin.  Will continue current management.  Met with Dr. Pascal Lux, declines further therapy because she does not feel "talking about the problems" will help.  Education on CBT provided.

## 2011-01-01 NOTE — Patient Instructions (Signed)
Please come back in 1 month and we will check your electrolytes and glucose. I will let you know once we have the lab results back. Your husband's snoring is disrupting your sleep.  I would be happy to see him, or we could find another doctor for him here.

## 2011-01-01 NOTE — Assessment & Plan Note (Signed)
Pt attempts nutrition and exercise.  Very knowledgeable about diet and exercise.  Check TSH.

## 2011-01-01 NOTE — Assessment & Plan Note (Signed)
AT goal.  Continue to follow.  Due for labs in 1 month.

## 2011-01-01 NOTE — Assessment & Plan Note (Signed)
Pt with elevated lipids, but against using meds to control them, prefers diet and exercise.

## 2011-02-06 ENCOUNTER — Other Ambulatory Visit: Payer: Self-pay | Admitting: Family Medicine

## 2011-02-06 NOTE — Telephone Encounter (Signed)
Refill request

## 2011-02-15 LAB — I-STAT 8, (EC8 V) (CONVERTED LAB)
Acid-Base Excess: 3 — ABNORMAL HIGH
Bicarbonate: 25.4 — ABNORMAL HIGH
Glucose, Bld: 103 — ABNORMAL HIGH
Sodium: 138
TCO2: 26
pH, Ven: 7.519 — ABNORMAL HIGH

## 2011-02-15 LAB — URINALYSIS, ROUTINE W REFLEX MICROSCOPIC
Bilirubin Urine: NEGATIVE
Glucose, UA: NEGATIVE
Specific Gravity, Urine: 1.01
pH: 7

## 2011-02-15 LAB — POCT I-STAT CREATININE
Creatinine, Ser: 0.9
Operator id: 272551

## 2011-02-15 LAB — DIFFERENTIAL
Basophils Absolute: 0.2 — ABNORMAL HIGH
Eosinophils Absolute: 0.1
Eosinophils Relative: 2
Lymphocytes Relative: 32

## 2011-02-15 LAB — CBC
HCT: 38.5
Platelets: 340
RDW: 12.4

## 2011-02-15 LAB — URINE MICROSCOPIC-ADD ON

## 2011-02-15 LAB — POCT PREGNANCY, URINE
Operator id: 272551
Preg Test, Ur: NEGATIVE

## 2011-02-22 ENCOUNTER — Emergency Department (HOSPITAL_COMMUNITY)
Admission: EM | Admit: 2011-02-22 | Discharge: 2011-02-22 | Disposition: A | Discharge status: Home / Self Care | Payer: BC Managed Care – PPO | Attending: Emergency Medicine | Admitting: Emergency Medicine

## 2011-02-22 DIAGNOSIS — I10 Essential (primary) hypertension: Secondary | ICD-10-CM | POA: Insufficient documentation

## 2011-02-22 DIAGNOSIS — Z86718 Personal history of other venous thrombosis and embolism: Secondary | ICD-10-CM | POA: Insufficient documentation

## 2011-02-22 DIAGNOSIS — IMO0001 Reserved for inherently not codable concepts without codable children: Secondary | ICD-10-CM | POA: Insufficient documentation

## 2011-02-22 DIAGNOSIS — Z79899 Other long term (current) drug therapy: Secondary | ICD-10-CM | POA: Insufficient documentation

## 2011-02-22 DIAGNOSIS — R252 Cramp and spasm: Secondary | ICD-10-CM | POA: Insufficient documentation

## 2011-02-22 LAB — POCT I-STAT, CHEM 8
Creatinine, Ser: 1.1 mg/dL (ref 0.50–1.10)
HCT: 32 % — ABNORMAL LOW (ref 36.0–46.0)
Hemoglobin: 10.9 g/dL — ABNORMAL LOW (ref 12.0–15.0)
Sodium: 138 mEq/L (ref 135–145)
TCO2: 20 mmol/L (ref 0–100)

## 2011-02-27 LAB — URINALYSIS, ROUTINE W REFLEX MICROSCOPIC
Bilirubin Urine: NEGATIVE
Glucose, UA: NEGATIVE
Ketones, ur: NEGATIVE
pH: 7

## 2011-02-27 LAB — POCT CARDIAC MARKERS
CKMB, poc: 1.7
Myoglobin, poc: 35.8
Troponin i, poc: <0.05

## 2011-02-27 LAB — COMPREHENSIVE METABOLIC PANEL
ALT: 6
AST: 23
CO2: 22
Calcium: 9.2
GFR calc Af Amer: >60
GFR calc non Af Amer: >60
Sodium: 140

## 2011-02-27 LAB — CBC
MCHC: 33.6
RBC: 4.33
WBC: 8.1

## 2011-02-27 LAB — DIFFERENTIAL
Eosinophils Absolute: 0.2
Eosinophils Relative: 2
Lymphs Abs: 2.6
Monocytes Relative: 7

## 2011-02-27 LAB — CK: Total CK: 70

## 2011-02-27 LAB — URINE MICROSCOPIC-ADD ON

## 2011-02-28 ENCOUNTER — Telehealth: Payer: Self-pay | Admitting: Family Medicine

## 2011-02-28 NOTE — Telephone Encounter (Signed)
I won't be in the office in the morning.  I will check in with whoever sees her, and try to get in touch with her.

## 2011-02-28 NOTE — Telephone Encounter (Signed)
Ann Castillo will be coming in tomorrow morning to see the cross cover doctor for symptoms she's been having for awhile.  She would like you to pop in if you are here.

## 2011-03-01 ENCOUNTER — Ambulatory Visit (INDEPENDENT_AMBULATORY_CARE_PROVIDER_SITE_OTHER): Payer: BLUE CROSS/BLUE SHIELD | Admitting: Family Medicine

## 2011-03-01 DIAGNOSIS — R197 Diarrhea, unspecified: Secondary | ICD-10-CM

## 2011-03-01 DIAGNOSIS — E669 Obesity, unspecified: Secondary | ICD-10-CM

## 2011-03-01 DIAGNOSIS — E785 Hyperlipidemia, unspecified: Secondary | ICD-10-CM

## 2011-03-01 LAB — LIPID PANEL: LDL Cholesterol: 162 mg/dL — ABNORMAL HIGH (ref 0–99)

## 2011-03-01 NOTE — Progress Notes (Signed)
  Subjective:    Patient ID: Ann Castillo, female    DOB: 1960-11-20, 50 y.o.   MRN: [id number]  HPI lab draw: Patient states she is here for lab draw, states that Ann Castillo had advised her to have lab work in September and she has not had this done. Would like to have these labs drawn today. On review of chart only see note of lipid panel and A1c that needed to be drawn. Patient agrees to have these drawn today  Diarrhea: Patient reports problems with diarrhea x one and a half months. She reports diarrhea daily-episodes of 1-5 times daily. Sometimes will have normal stools for a couple of days, followed by a week of diarrhea-multiple episodes daily. No new medications. No sick contacts. No blood in stools. No dark stools. States she has never had problems with diarrhea in the past. Endorses that occasionally will feel food getting stuck when she eats in the mid chest area-this happens one to 2 times per week. No abdominal pain. No weight loss.   Review of Systems As per above    Objective:   Physical Exam  Constitutional: She is oriented to person, place, and time. She appears well-developed and well-nourished.  HENT:  Head: Normocephalic and atraumatic.  Cardiovascular: Normal rate, regular rhythm and normal heart sounds.   No murmur heard. Pulmonary/Chest: Effort normal and breath sounds normal. No respiratory distress. She has no wheezes.  Abdominal: Soft. Bowel sounds are normal. She exhibits no distension. There is no tenderness. There is no rebound and no guarding.  Musculoskeletal: Normal range of motion.  Neurological: She is alert and oriented to person, place, and time.  Skin: No rash noted.  Psychiatric: She has a normal mood and affect. Her behavior is normal.          Assessment & Plan:

## 2011-03-01 NOTE — Assessment & Plan Note (Signed)
Cause of diarrhea unclear. Since she has had this for one to 2 months, we'll go ahead and obtain stool lactoferrin and stool cultures to rule out infectious cause. Low risk for ova and parasite to we'll hold on this study at this time. Patient to keep logbook of diarrhea episodes, frequency, what makes it better or worse, or any associated symptoms. Patient is to return for followup with Dr. Swaziland her primary care doctor-in 2 weeks. At followup appointment Dr. Swaziland to review lab work with patient.

## 2011-03-01 NOTE — Assessment & Plan Note (Signed)
A1c drawn today to screen for diabetes

## 2011-03-01 NOTE — Patient Instructions (Signed)
Return in 2 weeks to see Dr. Swaziland. Please keep log book of episodes of diarrhea, how often, consistency, what makes it better, what makes it worse.  -- this will be helpful for Dr. Swaziland to explore further the causes of the diarrhea.  Dr. Swaziland will review lab work with you at your follow up appointment

## 2011-03-01 NOTE — Assessment & Plan Note (Signed)
The panel drawn today. Patient has been trying to do lifestyle modifications to lower lipids.

## 2011-03-06 NOTE — Telephone Encounter (Signed)
Spoke with pt and reviewed labs over the phone.  She is still having intermittent diarrhea, and is trying to drink water and keep food diary.  She will follow up on the 23rd with me.

## 2011-03-14 LAB — COMPREHENSIVE METABOLIC PANEL
Albumin: 3.5
Alkaline Phosphatase: 31 — ABNORMAL LOW
BUN: 13
Calcium: 8.6
Potassium: 3.8
Sodium: 138
Total Protein: 6.3

## 2011-03-14 LAB — PROTIME-INR: INR: 1

## 2011-03-14 LAB — CARDIAC PANEL(CRET KIN+CKTOT+MB+TROPI)
CK, MB: 0.8
CK, MB: 1
Relative Index: INVALID
Total CK: 43
Total CK: 44
Total CK: 48
Troponin I: 0.01

## 2011-03-14 LAB — CBC
HCT: 37.3
MCHC: 34.1
Platelets: 280
RDW: 12.7

## 2011-03-20 ENCOUNTER — Ambulatory Visit (INDEPENDENT_AMBULATORY_CARE_PROVIDER_SITE_OTHER): Payer: BLUE CROSS/BLUE SHIELD | Admitting: Family Medicine

## 2011-03-20 ENCOUNTER — Encounter: Payer: Self-pay | Admitting: Family Medicine

## 2011-03-20 VITALS — BP 114/74 | HR 84 | Temp 98.3°F | Ht 64.0 in | Wt 250.0 lb

## 2011-03-20 DIAGNOSIS — Z23 Encounter for immunization: Secondary | ICD-10-CM

## 2011-03-20 DIAGNOSIS — R197 Diarrhea, unspecified: Secondary | ICD-10-CM

## 2011-03-20 DIAGNOSIS — F341 Dysthymic disorder: Secondary | ICD-10-CM

## 2011-03-20 DIAGNOSIS — I10 Essential (primary) hypertension: Secondary | ICD-10-CM

## 2011-03-20 MED ORDER — BUPROPION HCL ER (XL) 150 MG PO TB24: 150.0000 mg | ORAL_TABLET | ORAL | Status: DC

## 2011-03-20 NOTE — Patient Instructions (Addendum)
We will add another 150 mg of buproprion. You can take it all in the morning. Please come back and see me in 1 month.

## 2011-03-23 NOTE — Assessment & Plan Note (Signed)
Would like to try without meds.  Interested in nutrition and exercise.  Will hold lisinopril for now and follow.

## 2011-03-23 NOTE — Progress Notes (Signed)
  Subjective:    Patient ID: Ann Castillo, female    DOB: 1961-02-05, 50 y.o.   MRN: [id number]  HPI50 yo female here for f/u Still with diarrhea- Comes and goes every 5-6 days.  Feels urgency, +flatus, no association with food, when having days with diarrhea, goes 3-4 times in an hour.  Then has 3-4 days of normal BM.  Then some days with constipation, but no straining.  No BRBPR.  + noisy stomach.  Denies n/v, no GERD [place].  No f/c.  No sick contacts, no travel.  Stable stress level.  Going to [facility] for long weekend with husband. +worsening depression.  + Crying.  Feels tired and like giving up , but no SI.  Trouble sleeping.  Decreased appetite.  + guilt.  +stress from daughter, husband.  +anhedonia.   Has gained 7 lbs in 3 months.  States she eats about 1200 kcals avg day.  +Safe at home, but "tired of my life".  Wants to change it, but feels frustrated with self that she won't leave her husband.    Review of Systems  See hpi     Objective:   Physical Exam  Nursing note and vitals reviewed. Constitutional:       Obese.    Skin: She is not diaphoretic.  Psychiatric:       Nl grooming and dress.  Tearful.  Speech normal without FOI or LOA; non pressured.  Nl TC/TP.  Denies SI/HI          Assessment & Plan:

## 2011-03-23 NOTE — Assessment & Plan Note (Signed)
Worsening, but no SI/HI.  I do think if pt were to feel invested in therapy, it would be very beneficial - there are lots of stressors that pt cannot control, especially with her daughter.  Pt not interested int therapy at this time.  Discussed med options.  Does not want SSRI or TCA due to SE.  Will increase Wellbutrin.  Follow up 1 months.

## 2011-03-23 NOTE — Assessment & Plan Note (Signed)
Pt without concerning red flags.  States she has a history of ulcerative colitis or IBS.  Given pt ed on IBS, which is more consistent with pt's sx.  Also consider malabsorptive diarrhea given her central adiposity.  Pt will try yogurt or probiotics.  If persists, consider colonoscopy, but no need at this time.  Does not sound like ulcerative colitis, but will need to follow closely.

## 2011-04-16 ENCOUNTER — Encounter: Payer: Self-pay | Admitting: Family Medicine

## 2011-04-16 ENCOUNTER — Ambulatory Visit (INDEPENDENT_AMBULATORY_CARE_PROVIDER_SITE_OTHER): Payer: BC Managed Care – PPO | Admitting: Family Medicine

## 2011-04-16 DIAGNOSIS — I10 Essential (primary) hypertension: Secondary | ICD-10-CM

## 2011-04-16 DIAGNOSIS — R079 Chest pain, unspecified: Secondary | ICD-10-CM

## 2011-04-16 DIAGNOSIS — F341 Dysthymic disorder: Secondary | ICD-10-CM

## 2011-04-16 NOTE — Patient Instructions (Addendum)
Since your blood pressure is up again, I think you should start back on the lisinopril.  You can try 5 mg a day.  I am glad the antidepressant is doing better. I am glad you are willing to see Dr Freida Busman about the chest pain.

## 2011-04-17 DIAGNOSIS — R079 Chest pain, unspecified: Secondary | ICD-10-CM | POA: Insufficient documentation

## 2011-04-17 NOTE — Assessment & Plan Note (Signed)
Patient with elevated blood pressure currently. Will restart lisinopril at 5 mg a day.

## 2011-04-17 NOTE — Assessment & Plan Note (Signed)
Patient currently without symptoms. She does have a cardiologist Dr. Jearld Pies, and she will call him and make an appointment. Red flags reviewed.

## 2011-04-17 NOTE — Assessment & Plan Note (Signed)
Improved on current regimen. Still has a great deal of stress at home with her relationship with her husband and her daughter being difficult. She is not interested in therapy at this time.

## 2011-04-17 NOTE — Progress Notes (Signed)
  Subjective:    Patient ID: Ann Castillo, female    DOB: [birth year], 50 y.o.   MRN: [id number]  HPI patient here to followup.  #1 mood disorder. Patient feels she is doing much better on the higher dose of Wellbutrin. She is tolerating them well except for some decrease in sexual function. #2 chest pain. This is a chronic issue for the patient. She has been admitted over a year ago for same. She reports she is fine when she is walking on a flat surface. But when she goes up an incline at all she develops chest pain and shortness of breath. She has been evaluated with a  treadmill test in the past which was normal. However when she has lost weight she does not have this problem. She does not smoke and she is not drinking alcohol. The chest pain is brought on by  exertion and relieved with rest. She has no shortness of breath or chest pain while at rest.     Review of SystemsSee history of present illness     Objective:   Physical Exam  Constitutional: She appears well-developed and well-nourished. No distress.       obese  Cardiovascular: Normal rate, regular rhythm and normal heart sounds.  Exam reveals no gallop and no friction rub.   No murmur heard. Pulmonary/Chest: Effort normal and breath sounds normal. No respiratory distress. She has no wheezes. She has no rales.  Skin: She is not diaphoretic.  Psychiatric: She has a normal mood and affect. Her behavior is normal. Judgment and thought content normal.          Assessment & Plan:

## 2011-04-24 ENCOUNTER — Other Ambulatory Visit: Payer: Self-pay | Admitting: Family Medicine

## 2011-04-24 DIAGNOSIS — I10 Essential (primary) hypertension: Secondary | ICD-10-CM

## 2011-04-24 MED ORDER — LISINOPRIL 5 MG PO TABS: 5.0000 mg | ORAL_TABLET | Freq: Every day | ORAL | Status: DC

## 2011-04-24 NOTE — Telephone Encounter (Signed)
Spoke with Dr. Swaziland and I sent in rx for 5mg  lisinopril once daily

## 2011-04-24 NOTE — Telephone Encounter (Signed)
Pt seen on 11/19, supposed to have bp med sent to pharmacy, still not there, pt goes to [facility], pt says its for lisinopril

## 2011-06-04 ENCOUNTER — Telehealth: Payer: Self-pay | Admitting: Family Medicine

## 2011-06-04 ENCOUNTER — Other Ambulatory Visit: Payer: Self-pay | Admitting: Family Medicine

## 2011-06-04 MED ORDER — BUPROPION HCL ER (XL) 150 MG PO TB24: 150.0000 mg | ORAL_TABLET | ORAL | Status: DC

## 2011-06-04 MED ORDER — BUPROPION HCL ER (XL) 300 MG PO TB24: 300.0000 mg | ORAL_TABLET | Freq: Every day | ORAL | Status: DC

## 2011-06-04 NOTE — Telephone Encounter (Signed)
Ann Castillo is needing authorization for refills on both dosages of Wellbutrin.  She is completely out of the 300 so she can double up on the 150 until this is filled.  She uses [facility] [address].  Please call when this has been done.

## 2011-06-04 NOTE — Telephone Encounter (Signed)
Rx done. 

## 2011-06-04 NOTE — Telephone Encounter (Signed)
Patient notified

## 2011-06-04 NOTE — Telephone Encounter (Signed)
Forwarded to Dr. Swaziland

## 2011-06-14 ENCOUNTER — Other Ambulatory Visit: Payer: Self-pay | Admitting: Family Medicine

## 2011-06-14 NOTE — Telephone Encounter (Signed)
Refill request

## 2011-07-16 ENCOUNTER — Ambulatory Visit (INDEPENDENT_AMBULATORY_CARE_PROVIDER_SITE_OTHER): Payer: BC Managed Care – PPO | Admitting: Family Medicine

## 2011-07-16 ENCOUNTER — Encounter: Payer: Self-pay | Admitting: Family Medicine

## 2011-07-16 VITALS — BP 128/88 | HR 78 | Temp 98.3°F | Ht 64.0 in | Wt 240.0 lb

## 2011-07-16 DIAGNOSIS — D649 Anemia, unspecified: Secondary | ICD-10-CM

## 2011-07-16 DIAGNOSIS — R5383 Other fatigue: Secondary | ICD-10-CM

## 2011-07-16 DIAGNOSIS — I10 Essential (primary) hypertension: Secondary | ICD-10-CM

## 2011-07-16 DIAGNOSIS — R5381 Other malaise: Secondary | ICD-10-CM

## 2011-07-16 DIAGNOSIS — R059 Cough, unspecified: Secondary | ICD-10-CM

## 2011-07-16 DIAGNOSIS — R05 Cough: Secondary | ICD-10-CM

## 2011-07-16 DIAGNOSIS — E785 Hyperlipidemia, unspecified: Secondary | ICD-10-CM

## 2011-07-16 DIAGNOSIS — R0609 Other forms of dyspnea: Secondary | ICD-10-CM

## 2011-07-16 DIAGNOSIS — R079 Chest pain, unspecified: Secondary | ICD-10-CM

## 2011-07-16 LAB — COMPLETE METABOLIC PANEL WITH GFR
ALT: 13 U/L (ref 0–35)
AST: 13 U/L (ref 0–37)
Albumin: 4.1 g/dL (ref 3.5–5.2)
Calcium: 9.1 mg/dL (ref 8.4–10.5)
Chloride: 105 mEq/L (ref 96–112)
Creat: 0.82 mg/dL (ref 0.50–1.10)
Potassium: 4.3 mEq/L (ref 3.5–5.3)
Sodium: 139 mEq/L (ref 135–145)
Total Protein: 6.8 g/dL (ref 6.0–8.3)

## 2011-07-16 LAB — LIPID PANEL
HDL: 62 mg/dL (ref >39)
LDL Cholesterol: 170 mg/dL — ABNORMAL HIGH (ref 0–99)
Total CHOL/HDL Ratio: 4.2 Ratio

## 2011-07-16 LAB — CBC WITH DIFFERENTIAL/PLATELET
Basophils Absolute: 0 10*3/uL (ref 0.0–0.1)
Lymphocytes Relative: 17 % (ref 12–46)
Neutro Abs: 6.6 10*3/uL (ref 1.7–7.7)
Neutrophils Relative %: 73 % (ref 43–77)
Platelets: 511 10*3/uL — ABNORMAL HIGH (ref 150–400)
RBC: 4.45 MIL/uL (ref 3.87–5.11)
RDW: 18.1 % — ABNORMAL HIGH (ref 11.5–15.5)
WBC: 9.1 10*3/uL (ref 4.0–10.5)

## 2011-07-16 NOTE — Assessment & Plan Note (Signed)
Pt with years of chest pain and DOE.  Likely due to deconditioning, though she has been active in the past and still symptomatic.  She has had a cardiac workup, which was negative.  Although chronic PE is unlikely, pt does have a h/o PE on estrogen in the past.  Will check D-dimer today, and check CT angiogram if elevated.  Pt low risk on Wells criteria.

## 2011-07-16 NOTE — Assessment & Plan Note (Signed)
Check CBC today.  

## 2011-07-16 NOTE — Progress Notes (Signed)
Subjective:     Patient ID: Ann Castillo, female   DOB: 02/13/1961, 51 y.o.   MRN: [id number]  HPI 51 yo female here for several issues: 1) "Tired of being tired"  Pt reports she feel awful, and this has been happening for many years.  Does not feel it is due to depression or stress.  Does not sleep well.  Usually sleeps 4-5 hours a night, but has nights with only 2 hours.  Trazadone helps, but cannot take after 10 pm because will be to drowsy during the day. Problems that interfere with sleep: legs have "gathering" feeling and need to move.  + cough + shoulder pain.   Pt also with h/o anemia.  She reports menses q 3 weeks, last about 2 days. 2) Chronic cough. Has had for several years.  Has tried omeprazole without relief.  No change off or on lisinopril.  Uses about 30 cough drops a day.  Feels that she has something in her throat that irritates her.  She also feels that she is coming down with a URI as well in last few days - Nose feels swollen inside, hoarseness, eyes burning, HA.  Denies fever.   3)Dyspnea with exertion.  She is able to walk on a flat surface without problems, but with an incline at all, feels chest pain and dyspnea.   Has had cardiac work up  (neg myoview in march, 2011) and is followed by cardiology.  She has had this problem for a few years, but thinks it is worsening.   4)Family stress - daughter is now 13 yo.  She has hygiene issues - leaves used sanitary pads in bathroom, does not wear clean clothes.  Has argument with her father (pt's husband) last week and then left house to try to walk to BF house (10 miles away) in the dark and had to cross [address].  Cops were called, but they could not do anything because she is an adult.  Daughter won't take Abilify, but will take her ADHD meds.  Pt's husband has become more helpful with daughter.    Review of Systems  See HPI     Objective:   Physical Exam  Constitutional: She appears well-developed and well-nourished. No  distress.       Obese with marked central obesity.  HENT:  Nose: Nose normal.  Mouth/Throat: Oropharynx is clear and moist. No oropharyngeal exudate.  Neck: No tracheal deviation present. No thyromegaly present.  Cardiovascular: Normal rate, regular rhythm and normal heart sounds.  Exam reveals no gallop and no friction rub.   No murmur heard. Pulmonary/Chest: Effort normal and breath sounds normal. No respiratory distress. She has no wheezes. She has no rales.       + cough  Lymphadenopathy:    She has no cervical adenopathy.  Skin: She is not diaphoretic.  Psychiatric: She has a normal mood and affect. Her behavior is normal. Judgment and thought content normal.     Assessment:    Plan:

## 2011-07-16 NOTE — Assessment & Plan Note (Signed)
Will check lipids today

## 2011-07-16 NOTE — Assessment & Plan Note (Signed)
Pt with chronic cough for years, feeling that her throat is irritated.  No relief with omeprazole.  No change on or off ACE-I.  Declines trial of nasal steroids or antihistamine.  Will refer to ENT.

## 2011-07-16 NOTE — Assessment & Plan Note (Signed)
At goal.  Continue meds.  Check electrolytes.

## 2011-07-16 NOTE — Patient Instructions (Signed)
It was good to see you today. I will do the referral to ENT for the cough.  We will check some blood work today.  I will let you know the results. Let me know if you need anything from [place].

## 2011-07-17 ENCOUNTER — Telehealth: Payer: Self-pay | Admitting: *Deleted

## 2011-07-17 ENCOUNTER — Encounter: Payer: Self-pay | Admitting: *Deleted

## 2011-07-17 NOTE — Telephone Encounter (Signed)
Called and gave pt the following information for ENT appt W/ Dr. Ezzard Standing  02.25.2013 @ 115 pm  [address] Ph: [phone] Pt agreed to appt.Laureen Ochs, Viann Shove

## 2011-07-29 ENCOUNTER — Other Ambulatory Visit: Payer: Self-pay | Admitting: Family Medicine

## 2011-07-29 NOTE — Telephone Encounter (Signed)
Refill request

## 2011-08-20 ENCOUNTER — Encounter: Payer: Self-pay | Admitting: Family Medicine

## 2011-08-20 ENCOUNTER — Ambulatory Visit (INDEPENDENT_AMBULATORY_CARE_PROVIDER_SITE_OTHER): Payer: BC Managed Care – PPO | Admitting: Family Medicine

## 2011-08-20 VITALS — BP 119/79 | HR 84 | Temp 98.0°F | Ht 64.0 in | Wt 248.8 lb

## 2011-08-20 DIAGNOSIS — J309 Allergic rhinitis, unspecified: Secondary | ICD-10-CM

## 2011-08-20 DIAGNOSIS — R059 Cough, unspecified: Secondary | ICD-10-CM

## 2011-08-20 DIAGNOSIS — R05 Cough: Secondary | ICD-10-CM

## 2011-08-20 NOTE — Assessment & Plan Note (Addendum)
No evidence of bacterial conjunctivitis.  Itching and watering consistent with allergic conjunctivitis.  Advised zaditor or alaway eye drops

## 2011-08-20 NOTE — Assessment & Plan Note (Signed)
No bronchitis or other acute reason for cough. Today with some allergic symptoms.  Advised stepwise approach-  Already currently taking omeprazole- advised to add antihistamine x 2 weeks.  If not improved, will do 1-2 week trial off lisonpril.  Will follow-up with PCp in 3-4 weeks to discuss further workup.

## 2011-08-20 NOTE — Progress Notes (Signed)
  Subjective:    Patient ID: Ann Castillo, female    DOB: 1961/02/04, 51 y.o.   MRN: [id number]  HPIWork in appt for cough  Noted 1-2 weeks of cough, upon further history, patient states cough is chronic.  Notes in past 2 weeks, eyes watery, crusting in Am, itchy  Has history of chronic cough-was refered by PCP to ENT in February- advised empiric PPI BID, flonase, She did not take advice.  Notes eyes and nose itchy.  Cough nonproductive.  No dyspnea., has sore throat.    Review of SystemsNo fever     Objective:   Physical Exam  GEN: Alert & Oriented, No acute distress HEENT: [place]/AT. EOMI, PERRLA, no conjunctival injection or scleral icterus.  Bilateral tympanic membranes intact without erythema or effusion.  .  Nares without edema or rhinorrhea.  Oropharynx is without erythema or exudates.  No anterior or posterior cervical lymphadenopathy. CV:  Regular Rate & Rhythm, no murmur Respiratory:  Normal work of breathing, CTAB Abd:  + BS, soft, no tenderness to palpation Ext: no pre-tibial edema       Assessment & Plan:

## 2011-08-20 NOTE — Patient Instructions (Signed)
Allergy eye drops- Alaway or Zaditor  Take 2 weeks of daily omeprazole and claritin or zyrtec  If no improvement, can try 1-2 weeks off of lisinopril  Follow-up in 3-4 weeks with Dr. Swaziland

## 2011-08-22 ENCOUNTER — Telehealth: Payer: Self-pay | Admitting: Family Medicine

## 2011-08-22 MED ORDER — BENZONATATE 100 MG PO CAPS: 100.0000 mg | ORAL_CAPSULE | Freq: Three times a day (TID) | ORAL | Status: DC | PRN

## 2011-08-22 NOTE — Telephone Encounter (Signed)
i sent in rx for tessalon perles

## 2011-08-22 NOTE — Telephone Encounter (Signed)
Patient is calling because she was seen for a sore throat and cough yesterday, but was not prescribed anything.  She is calling because her cough is so bad that she feels like something is sticking in her throat and it is keeping her up at night.  She would like an Rx for a cough medication even if it doesn't have codeine in it so that she can rest.

## 2011-08-22 NOTE — Telephone Encounter (Signed)
Has tried cough drops,  Delsym,  & Tussin without relief for cough.  No congestion or drainage.  Painful to swallow.  Has had symptoms for 3 weeks.  Is using OTC allergy drops. Has not taken BP med today to see if it will help with cough.  Patient states she is not able to take codeine and has tried some "gold pills" in the past that seemed to help.  Will route note to Dr. Earnest Bailey and call patient back.   Gaylene Brooks, RN

## 2011-08-22 NOTE — Telephone Encounter (Signed)
Addended by: Macy Mis on: 08/22/2011 05:03 PM   Modules accepted: Orders

## 2011-08-22 NOTE — Telephone Encounter (Signed)
I closed this chart in error.  Please address below.  Gaylene Brooks, RN

## 2011-08-22 NOTE — Telephone Encounter (Signed)
Pt notified.Ann Castillo Lynetta  

## 2011-08-27 ENCOUNTER — Ambulatory Visit (INDEPENDENT_AMBULATORY_CARE_PROVIDER_SITE_OTHER): Payer: BC Managed Care – PPO | Admitting: Family Medicine

## 2011-08-27 ENCOUNTER — Encounter: Payer: Self-pay | Admitting: Family Medicine

## 2011-08-27 VITALS — BP 134/86 | HR 77 | Temp 98.4°F | Ht 64.0 in | Wt 248.0 lb

## 2011-08-27 DIAGNOSIS — R0989 Other specified symptoms and signs involving the circulatory and respiratory systems: Secondary | ICD-10-CM

## 2011-08-27 DIAGNOSIS — T887XXA Unspecified adverse effect of drug or medicament, initial encounter: Secondary | ICD-10-CM

## 2011-08-27 DIAGNOSIS — G4700 Insomnia, unspecified: Secondary | ICD-10-CM

## 2011-08-27 DIAGNOSIS — I2699 Other pulmonary embolism without acute cor pulmonale: Secondary | ICD-10-CM

## 2011-08-27 DIAGNOSIS — R05 Cough: Secondary | ICD-10-CM

## 2011-08-27 DIAGNOSIS — J029 Acute pharyngitis, unspecified: Secondary | ICD-10-CM

## 2011-08-27 NOTE — Progress Notes (Signed)
  Subjective:    Patient ID: Ann Castillo, female    DOB: 16-Feb-1961, 51 y.o.   MRN: [id number]  HPI 32 you female here with chronic cough seen recently for acute worsening.  She continues to have cough, feels like a tickle in her throat.  Relief with benzotate for 3-4 hours, but then return of symtoms.  Eating lots of cough drops, enough to have a laxative effect from them.   Stopped her lisinopril without relief.    Now main concern is ear, jaw pain on L.  Pain with swallowing, yawning, talking, but not just opening mouth.  Ear pain present for 2 weeks, getting worse.  Having trouble sleeping due to pain and cough.  Takes ketoprofen for pain.  Advil and acetaminophen do not help.  + chills. No fever.  No vomiting.   Denies reflux sx. Every few weeks, does have pain with swallowing, sharp pain that seems to follow food down.  Then resolves.  Her mother had frequent coughing, and had to have esophagus dilated.  Taking omeprazole, but did not start second PPI suggested by ENT because she read side effects could include HA.  Denies rhinorrhea, nasal congestion.     Review of Systems  See HPI     Objective:   Physical Exam  Nursing note and vitals reviewed. Constitutional: She appears well-developed and well-nourished. No distress.       Obese.  HENT:  Head: Normocephalic and atraumatic.  Right Ear: External ear normal.  Left Ear: External ear normal.  Mouth/Throat: Oropharynx is clear and moist. No oropharyngeal exudate.       Slight prominence of L tonsil.  Eyes: Conjunctivae are normal. Right eye exhibits no discharge. Left eye exhibits no discharge. No scleral icterus.  Neck: Normal range of motion. Neck supple. No tracheal deviation present. No thyromegaly present.       Slightly enlarged submandibular node on L.  No other adenopathy.  Cardiovascular: Normal rate, regular rhythm and normal heart sounds.  Exam reveals no gallop and no friction rub.   No murmur heard. Pulmonary/Chest:  Effort normal and breath sounds normal. No respiratory distress. She has no wheezes. She has no rales.  Skin: She is not diaphoretic.  Psychiatric: She has a normal mood and affect. Her behavior is normal. Judgment and thought content normal.          Assessment & Plan:

## 2011-08-27 NOTE — Assessment & Plan Note (Signed)
ENT did not find a cause for pt's condition.  Note reviewed again today.  Indirect laryngoscopy revealed possible GERD effects, nothing else concerning. Unsure why pt now with throat pain and ear pain.  Could be just post viral exacerbation on chronic cough.  Also consider pertussis, but pt no longer infectious if this is the case, and abx would not help.   Will refer to pulmonary for further evaluation, and also ask them to assess her DOE.

## 2011-08-27 NOTE — Patient Instructions (Signed)
I think this cough is likely worse from the effects of a viral illness. Ann Castillo can try honey at night before bedtime. We will do the pulmonary referral.

## 2011-09-19 ENCOUNTER — Ambulatory Visit (INDEPENDENT_AMBULATORY_CARE_PROVIDER_SITE_OTHER)
Admission: RE | Admit: 2011-09-19 | Discharge: 2011-09-19 | Disposition: A | Discharge status: Home / Self Care | Payer: BC Managed Care – PPO | Source: Ambulatory Visit | Attending: Internal Medicine | Admitting: Internal Medicine

## 2011-09-19 ENCOUNTER — Ambulatory Visit (INDEPENDENT_AMBULATORY_CARE_PROVIDER_SITE_OTHER): Payer: BC Managed Care – PPO | Admitting: Internal Medicine

## 2011-09-19 ENCOUNTER — Encounter: Payer: Self-pay | Admitting: Internal Medicine

## 2011-09-19 ENCOUNTER — Institutional Professional Consult (permissible substitution): Payer: BC Managed Care – PPO | Admitting: Internal Medicine

## 2011-09-19 VITALS — BP 124/80 | HR 77 | Temp 98.3°F | Ht 64.0 in | Wt 258.0 lb

## 2011-09-19 DIAGNOSIS — R0609 Other forms of dyspnea: Secondary | ICD-10-CM

## 2011-09-19 DIAGNOSIS — R0600 Dyspnea, unspecified: Secondary | ICD-10-CM | POA: Insufficient documentation

## 2011-09-19 DIAGNOSIS — R059 Cough, unspecified: Secondary | ICD-10-CM

## 2011-09-19 DIAGNOSIS — R05 Cough: Secondary | ICD-10-CM

## 2011-09-19 DIAGNOSIS — R0989 Other specified symptoms and signs involving the circulatory and respiratory systems: Secondary | ICD-10-CM

## 2011-09-19 MED ORDER — FAMOTIDINE 20 MG PO TABS: ORAL_TABLET | ORAL | Status: DC

## 2011-09-19 MED ORDER — BENZONATATE 200 MG PO CAPS: 200.0000 mg | ORAL_CAPSULE | Freq: Three times a day (TID) | ORAL | Status: AC | PRN

## 2011-09-19 MED ORDER — OMEPRAZOLE 40 MG PO CPDR: DELAYED_RELEASE_CAPSULE | ORAL | Status: DC

## 2011-09-19 MED ORDER — OMEPRAZOLE 40 MG PO CPDR: 40.0000 mg | DELAYED_RELEASE_CAPSULE | ORAL | Status: DC | PRN

## 2011-09-19 NOTE — Assessment & Plan Note (Addendum)
The most common causes of chronic cough in immunocompetent adults include the following: upper airway cough syndrome (UACS), previously referred to as postnasal drip syndrome (PNDS), which is caused by variety of rhinosinus conditions; (2) asthma; (3) GERD; (4) chronic bronchitis from cigarette smoking or other inhaled environmental irritants; (5) nonasthmatic eosinophilic bronchitis; and (6) bronchiectasis.   These conditions, singly or in combination, have accounted for up to 94% of the causes of chronic cough in prospective studies.   Other conditions have constituted no >6% of the causes in prospective studies These have included bronchogenic carcinoma, chronic interstitial pneumonia, sarcoidosis, left ventricular failure, ACEI-induced cough, and aspiration from a condition associated with pharyngeal dysfunction.   Chronic cough is often simultaneously caused by more than one condition. A single cause has been found from 38 to 82% of the time, multiple causes from 18 to 62%. Multiply caused cough has been the result of three diseases up to 42% of the time.     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   These are the same pts (now being labeled as having "irritable larynx syndrome" by some cough centers) who not infrequently have a history of having failed to tolerate ace inhibitors,  dry powder inhalers or biphosphonates or report having atypical reflux symptoms that don't respond to standard doses of PPI , and are easily confused as having aecopd or  asthma flares by even experienced allergists/ pulmonologists.   She is off acei and some better but now needs to eliminate cyclical/ secondary reflux (coughs so hard vomits) then regroup if not better

## 2011-09-19 NOTE — Progress Notes (Signed)
  Subjective:    Patient ID: Ann Castillo, female    DOB: 06/18/1960  MRN: [id number]  HPI  80 yowf never smoker with dx pe 2008 and cough onset around 2010 referred 09/19/2011 to pulmonary by Sarah [place] in the [facility]  09/19/2011 1st pulmonary eval cc cough x 4 years comes and goes and good ex tolerance despite obesity.  Wt troughed at 175 and then gradually up since then to a max of 258. Cough waxes and wanes and worse in 4-6 week stretches better p tessilon and cough drops,  Assoc with sore throat neg ent w/u with sensation of globus which has resolved off acei. On prn omeprazole with cough so severe gag/vomit and urinary incont, cough is mostly dry.  More day than night and when talking.  On ACEI until 08/27/11 with improvement in cough and throat fullness to where doesn't feel needs tessalon anymore but it was the most effective of all the rx x cough drops which she takes by the handful, especially hall's  Sleeping ok without nocturnal  or early am exacerbation  of respiratory  c/o's or need for noct saba. Also denies any obvious fluctuation of symptoms with weather or environmental changes or other aggravating or alleviating factors except as outlined above    Review of Systems  Constitutional: Negative for fever, chills and unexpected weight change.  HENT: Positive for ear pain. Negative for nosebleeds, congestion, sore throat, rhinorrhea, sneezing, trouble swallowing, dental problem, voice change, postnasal drip and sinus pressure.   Eyes: Negative for visual disturbance.  Respiratory: Positive for cough and shortness of breath. Negative for choking.   Cardiovascular: Positive for leg swelling. Negative for chest pain.  Gastrointestinal: Negative for vomiting, abdominal pain and diarrhea.  Genitourinary: Negative for difficulty urinating.  Musculoskeletal: Positive for arthralgias.  Skin: Negative for rash.  Neurological: Negative for tremors, syncope and headaches.    Hematological: Does not bruise/bleed easily.       Objective:   Physical Exam  amb obese wf nad with very harsh barking qualitiy upper airway brought on by talking  Wt  258 09/19/2011  HEENT: nl dentition, turbinates, and orophanx. Nl external ear canals without cough reflex   NECK :  without JVD/Nodes/TM/ nl carotid upstrokes bilaterally   LUNGS: no acc muscle use, clear to A and P bilaterally without cough on insp or exp maneuvers   CV:  RRR  no s3 or murmur or increase in P2, no edema   ABD:  soft and nontender with nl excursion in the supine position. No bruits or organomegaly, bowel sounds nl  MS:  warm without deformities, calf tenderness, cyanosis or clubbing  SKIN: warm and dry without lesions    NEURO:  alert, approp, no deficits    CXR  09/19/2011 :  Heart size is normal. The lungs are grossly clear with the exception of curvilinear ill-defined left lower lobe airspace opacification. Bases not well evaluated due to technique and body habitus. No pleural effusion. No acute osseous finding. My review: no sign findings LLL on lateral view       Assessment & Plan:

## 2011-09-19 NOTE — Assessment & Plan Note (Signed)
-   09/19/2011  Walked RA x 3 laps @ 185 ft each stopped due to  End of study, no sign desat  Multifactorial but mostly obesity/ anemia/ deconditioning with component of upper airway cough syndrome/ ? vcd contributing. Also concerned about ischemia but has already been evaluated by [facility] > may need repeat gxt or formal cpst to work out, but only after max gerd rx.

## 2011-09-19 NOTE — Patient Instructions (Signed)
Try omeprazole 40 mg  Take 30-60 min before first meal of the day and Pepcid 20 mg one bedtime for at least a month  Use pearls as need to suppress the cough completely  GERD (REFLUX)  is an extremely common cause of respiratory symptoms, many times with no significant heartburn at all.    It can be treated with medication, but also with lifestyle changes including avoidance of late meals, excessive alcohol, smoking cessation, and avoid fatty foods, chocolate, peppermint, colas, red wine, and acidic juices such as orange juice.  NO MINT OR MENTHOL PRODUCTS SO NO COUGH DROPS  USE SUGARLESS CANDY INSTEAD (jolley ranchers or [employer] at [employer]) NO OIL BASED VITAMINS - use powdered substitutes.  If continue with pressure in chest with climbing steps you will need to see Dr Jearld Pies right away.

## 2011-09-20 NOTE — Progress Notes (Signed)
Quick Note:  Pt aware of results. No questions or concerns at this time. ______ 

## 2011-09-27 ENCOUNTER — Other Ambulatory Visit: Payer: Self-pay | Admitting: Family Medicine

## 2011-11-07 ENCOUNTER — Encounter: Payer: Self-pay | Admitting: *Deleted

## 2011-11-14 ENCOUNTER — Other Ambulatory Visit: Payer: Self-pay | Admitting: Family Medicine

## 2011-11-19 ENCOUNTER — Ambulatory Visit (INDEPENDENT_AMBULATORY_CARE_PROVIDER_SITE_OTHER): Payer: BC Managed Care – PPO | Admitting: Cardiology

## 2011-11-19 ENCOUNTER — Encounter: Payer: Self-pay | Admitting: Cardiology

## 2011-11-19 VITALS — BP 118/82 | HR 69 | Ht 64.0 in | Wt 249.0 lb

## 2011-11-19 DIAGNOSIS — E669 Obesity, unspecified: Secondary | ICD-10-CM

## 2011-11-19 DIAGNOSIS — R079 Chest pain, unspecified: Secondary | ICD-10-CM

## 2011-11-19 DIAGNOSIS — E785 Hyperlipidemia, unspecified: Secondary | ICD-10-CM

## 2011-11-19 NOTE — Assessment & Plan Note (Signed)
Patient has been getting episodes of chest pressure with climbing stairs or walking up an incline for a number of weeks. She wants to exercise more but is scared to with these symptoms.  She does have a family history of premature CAD as well as hyperlipidemia.  I will get an ETT-myoview.

## 2011-11-19 NOTE — Assessment & Plan Note (Signed)
It is imperative for her long-term well-being that she lose weight.  She has been trying but weight keeps going up.  She may be a good candidate for bariatric surgery.

## 2011-11-19 NOTE — Progress Notes (Signed)
Patient ID: Ann Castillo, female   DOB: Jul 06, 1960, 51 y.o.   MRN: [id number] PCP: Dr. Sarah [place]aziland  51 yo with h/o hyperlipidemia and obesity returns for cardiology evaluation. She has had chest pain in the past with a negative myoview in 3/11.  Today, she reports increasing exertional dyspnea.  She is short of breath when she climbs a flight of steps and has concerning chest heaviness/pressure along with the shortness of breath.  She also notes the chest pressure when walking up a hill or any sort of incline.  It feels like something sitting on her chest.  No problems on flat ground. She has gained 21 lbs since I saw her last 2 years ago and is very frustrated about her inability to lose weight with diet/exercise.  Her exercise is somewhat limited by knee pain.   Labs (5/10) on Crestor: LDL 108, HDL 57  Labs (9/10) off Crestor: LDL 181, CK normal  Labs (4/09): LDL 118, HDL 44, TGs 177, cardiac enzymes negative, D dimer negative  Labs (2/13): LDL 170, HDL 62, K 4.3, creatinine 8.11  ECG: NSR, normal   Allergies (verified):  1) ! Pravachol  2) ! Celebrex  3) ! * Estragen  4) ! Crestor (Rosuvastatin Calcium)  5) Codeine Phosphate (Codeine Phosphate)   Past Medical History:  1. coxodynia, gingivital cyst, h/o 2 miscarriages, insulin resistance  2. Hyperlipidemia: Pt developed myalgias with pravastatin, then had an anaphylactic reaction after taking celecoxib for myalgias with pravastatin. She had myalgias also with low dose Crestor despite use of Co-Q10.  3. Hypertension  4. Migraines  5. Obesity  6. Pulmonary Embolism-2006. Negative CTA for PE 3/10.  7. Bilateral leg edema  8. G E R D  9. Echo (3/10): EF 55-60%, normal valves, mild LAE  10. ETT-myoview (3/10): poor exercise capacity (4'5"), exercise limited by SOB and CP. No ischemic ECG changes. Normal LV systolic function. No evidence on perfusion images for infarction or ischemia. ETT-myoview (3/11) for atypical chest pain  admission: EF 74%, no infarction or ischemia.   Family History:  father w/ 5 MIs (first at 13) and DM, Mother had several miscarriages and now has Alzheimer's disease.   Social History:  Married yet feels unsupported in marriage.  Has daughter and 2 sons Pt is a IT consultant for a divorce attorney.  Never smoked.   Review of Systems  All systems reviewed and negative except as per HPI.   Current Outpatient Prescriptions  Medication Sig Dispense Refill  . ALPRAZolam (XANAX) 0.5 MG tablet Take 0.5 mg by mouth 2 (two) times daily as needed. For anxiety        . buPROPion (WELLBUTRIN XL) 150 MG 24 hr tablet TAKE 1 TABLET (150 MG TOTAL) BY MOUTH EVERY MORNING.  90 tablet  0  . buPROPion (WELLBUTRIN XL) 300 MG 24 hr tablet TAKE 1 TABLET (300 MG TOTAL) BY MOUTH DAILY.  90 tablet  0  . famotidine (PEPCID) 20 MG tablet One at bedtime  30 tablet  11  . glucosamine-chondroitin 500-400 MG tablet Take 1 tablet by mouth daily.      Marland Kitchen ketoprofen (ORUDIS) 50 MG capsule Take 50 mg by mouth as needed.       . magnesium gluconate (MAGONATE) 500 MG tablet Take 500 mg by mouth daily.      . meloxicam (MOBIC) 15 MG tablet Take 15 mg by mouth daily as needed.       . milk thistle 175 MG tablet Take 175  mg by mouth daily.      . NON FORMULARY bromium colonate, daily      . omeprazole (PRILOSEC) 40 MG capsule TAKE ONE CAPSULE BY MOUTH AS NEEDED  90 capsule  1  . DISCONTD: omeprazole (PRILOSEC) 40 MG capsule Take 30-60 min before first meal of the day  90 capsule  1    BP 118/82  Pulse 69  Ht 5\' 4"  (1.626 m)  Wt 112.946 kg (249 lb)  BMI 42.74 kg/m2 General: NAD, morbid obesity Neck: Thick, no JVD, no thyromegaly or thyroid nodule.  Lungs: Clear to auscultation bilaterally with normal respiratory effort. CV: Nondisplaced PMI.  Heart regular S1/S2, no S3/S4, no murmur.  No peripheral edema.  No carotid bruit.  Normal pedal pulses.  Abdomen: Soft, nontender, no hepatosplenomegaly, no distention.  Skin:  Xanthelasma around eyes Neurologic: Alert and oriented x 3.  Psych: Normal affect. Extremities: No clubbing or cyanosis.

## 2011-11-19 NOTE — Patient Instructions (Addendum)
Your physician has requested that you have en exercise stress myoview. For further information please visit [email or web address]. Please follow instruction sheet, as given.  Your physician recommends that you schedule a follow-up appointment as needed with Dr McLean.      

## 2011-11-19 NOTE — Assessment & Plan Note (Signed)
LDL is high though HDL is also.  Given her family history, would be reasonable to take a statin.  However, she is adamant that she does not want to do this.

## 2011-11-27 ENCOUNTER — Other Ambulatory Visit: Payer: Self-pay | Admitting: Family Medicine

## 2011-11-28 ENCOUNTER — Ambulatory Visit (HOSPITAL_COMMUNITY): Payer: BC Managed Care – PPO | Attending: Cardiology | Admitting: Radiology

## 2011-11-28 VITALS — BP 137/89 | Ht 64.0 in | Wt 250.0 lb

## 2011-11-28 DIAGNOSIS — Z8249 Family history of ischemic heart disease and other diseases of the circulatory system: Secondary | ICD-10-CM | POA: Insufficient documentation

## 2011-11-28 DIAGNOSIS — R0609 Other forms of dyspnea: Secondary | ICD-10-CM | POA: Insufficient documentation

## 2011-11-28 DIAGNOSIS — R5381 Other malaise: Secondary | ICD-10-CM | POA: Insufficient documentation

## 2011-11-28 DIAGNOSIS — R0602 Shortness of breath: Secondary | ICD-10-CM | POA: Insufficient documentation

## 2011-11-28 DIAGNOSIS — R42 Dizziness and giddiness: Secondary | ICD-10-CM | POA: Insufficient documentation

## 2011-11-28 DIAGNOSIS — E663 Overweight: Secondary | ICD-10-CM | POA: Insufficient documentation

## 2011-11-28 DIAGNOSIS — E785 Hyperlipidemia, unspecified: Secondary | ICD-10-CM | POA: Insufficient documentation

## 2011-11-28 DIAGNOSIS — R0989 Other specified symptoms and signs involving the circulatory and respiratory systems: Secondary | ICD-10-CM | POA: Insufficient documentation

## 2011-11-28 DIAGNOSIS — R079 Chest pain, unspecified: Secondary | ICD-10-CM | POA: Insufficient documentation

## 2011-11-28 DIAGNOSIS — R5383 Other fatigue: Secondary | ICD-10-CM | POA: Insufficient documentation

## 2011-11-28 DIAGNOSIS — R002 Palpitations: Secondary | ICD-10-CM | POA: Insufficient documentation

## 2011-11-28 DIAGNOSIS — I10 Essential (primary) hypertension: Secondary | ICD-10-CM | POA: Insufficient documentation

## 2011-11-28 MED ORDER — TECHNETIUM TC 99M TETROFOSMIN IV KIT
33.0000 | PACK | Freq: Once | INTRAVENOUS | Status: AC | PRN
  Administered 2011-11-28: 33 via INTRAVENOUS

## 2011-11-28 MED ORDER — TECHNETIUM TC 99M TETROFOSMIN IV KIT
11.0000 | PACK | Freq: Once | INTRAVENOUS | Status: AC | PRN
  Administered 2011-11-28: 11 via INTRAVENOUS

## 2011-11-28 NOTE — Progress Notes (Addendum)
[facility] SITE 3 NUCLEAR MED [address] [place] [address] [phone]  Cardiology Nuclear Med Study  Ann Castillo is a 51 y.o. female     MRN : [id number]     DOB: 1961-05-13  Procedure Date: 11/28/2011  Nuclear Med Background Indication for Stress Test:  Evaluation for Ischemia History:  2011- MPS, Normal EF 74%, 2010 Echo EF 55-60% Cardiac Risk Factors: Family History - CAD, Hypertension, Lipids and Overweight  Symptoms:  Chest Pain (last date of chest discomfort 3 days ago), Dizziness, DOE, Fatigue with Exertion, Palpitations and SOB   Nuclear Pre-Procedure Caffeine/Decaff Intake:  None NPO After: 8:30pm   Lungs:  clear O2 Sat: 98% on room air. IV 0.9% NS with Angio Cath:  22g  IV Site: R Wrist  IV Started by:  Stanton Kidney, EMT-P  Chest Size (in):  40 Cup Size: C  Height: 5\' 4"  (1.626 m)  Weight:  250 lb (113.399 kg)  BMI:  Body mass index is 42.91 kg/(m^2). Tech Comments:  NA    Nuclear Med Study 1 or 2 day study: 1 day  Stress Test Type:  Stress  Reading MD: Willa Rough, MD  Order Authorizing Provider:  Marca Ancona, MD  Resting Radionuclide: Technetium 12m Tetrofosmin  Resting Radionuclide Dose: 11.0 mCi   Stress Radionuclide:  Technetium 12m Tetrofosmin  Stress Radionuclide Dose: 33.0 mCi           Stress Protocol Rest HR: 69 Stress HR: 157  Rest BP: 137/89 Stress BP: 153/81  Exercise Time (min): 5:01 METS: 5.8   Predicted Max HR: 171 bpm % Max HR: 91.81 bpm Rate Pressure Product: 86578   Dose of Adenosine (mg):  n/a Dose of Lexiscan: n/a mg  Dose of Atropine (mg): n/a Dose of Dobutamine: n/a mcg/kg/min (at max HR)  Stress Test Technologist: Bonnita Levan, RN  Nuclear Technologist:  Doyne Keel, CNMT     Rest Procedure:  Myocardial perfusion imaging was performed at rest 45 minutes following the intravenous administration of Technetium 110m Tetrofosmin. Rest ECG:NSR- No acute changes  Stress Procedure:  The patient  performed treadmill exercise using a Bruce  Protocol for 5:01 minutes. The patient stopped due to extreme dyspnea and c/o mild chest pressure in recovery lasting less than 1 minute.  There were no significant ST-T wave changes, occ. PAC noted. Technetium 62m Tetrofosmin was injected at peak exercise and myocardial perfusion imaging was performed after a brief delay. Stress ECG: No significant change from baseline ECG  QPS Raw Data Images:  Normal; no motion artifact; normal heart/lung ratio. Stress Images:  Normal homogeneous uptake in all areas of the myocardium. Rest Images:  Normal homogeneous uptake in all areas of the myocardium. Subtraction (SDS):  There is no evidence of scar or ischemia. Transient Ischemic Dilatation (Normal <1.22):  1.05 Lung/Heart Ratio (Normal <0.45):  0.34  Quantitative Gated Spect Images QGS EDV:  100 ml QGS ESV:  31 ml  Impression Exercise Capacity:  Below average.  BP Response:  Normal blood pressure response. Clinical Symptoms:  Short of breath.  ECG Impression:  Insignificant upsloping ST segment depression. Comparison with Prior Nuclear Study: No significant change from previous study  Overall Impression:  Normal stress nuclear study.  LV Ejection Fraction: 69%.  LV Wall Motion:  NL LV Function; NL Wall Motion  Marca Ancona 11/28/2011  Normal study.   Marca Ancona 11/30/2011

## 2011-11-30 NOTE — Progress Notes (Signed)
Left message to call back  

## 2011-12-03 NOTE — Progress Notes (Signed)
LMTCB ./CY 

## 2011-12-04 NOTE — Progress Notes (Signed)
Pt notifed.

## 2011-12-04 NOTE — Progress Notes (Signed)
Pt.notified

## 2012-01-19 ENCOUNTER — Other Ambulatory Visit: Payer: Self-pay | Admitting: Family Medicine

## 2012-01-29 ENCOUNTER — Other Ambulatory Visit: Payer: Self-pay | Admitting: Family Medicine

## 2012-01-29 DIAGNOSIS — Z1231 Encounter for screening mammogram for malignant neoplasm of breast: Secondary | ICD-10-CM

## 2012-02-05 ENCOUNTER — Ambulatory Visit (HOSPITAL_COMMUNITY)
Admission: RE | Admit: 2012-02-05 | Discharge: 2012-02-05 | Disposition: A | Discharge status: Home / Self Care | Payer: BC Managed Care – PPO | Source: Ambulatory Visit | Attending: Family Medicine | Admitting: Family Medicine

## 2012-02-05 DIAGNOSIS — Z1231 Encounter for screening mammogram for malignant neoplasm of breast: Secondary | ICD-10-CM

## 2012-03-17 ENCOUNTER — Ambulatory Visit: Payer: BC Managed Care – PPO

## 2012-05-19 ENCOUNTER — Other Ambulatory Visit: Payer: Self-pay | Admitting: Family Medicine

## 2012-05-22 ENCOUNTER — Encounter: Payer: Self-pay | Admitting: *Deleted

## 2012-05-22 NOTE — Telephone Encounter (Signed)
This encounter was created in error - please disregard.

## 2012-09-17 ENCOUNTER — Other Ambulatory Visit: Payer: Self-pay | Admitting: Orthopedic Surgery

## 2012-09-17 DIAGNOSIS — M25561 Pain in right knee: Secondary | ICD-10-CM

## 2012-09-19 ENCOUNTER — Ambulatory Visit
Admission: RE | Admit: 2012-09-19 | Discharge: 2012-09-19 | Disposition: A | Discharge status: Home / Self Care | Payer: BC Managed Care – PPO | Source: Ambulatory Visit | Attending: Orthopedic Surgery | Admitting: Orthopedic Surgery

## 2012-09-19 DIAGNOSIS — M25561 Pain in right knee: Secondary | ICD-10-CM

## 2012-10-13 ENCOUNTER — Telehealth (HOSPITAL_COMMUNITY): Payer: Self-pay | Admitting: Emergency Medicine

## 2012-10-13 ENCOUNTER — Emergency Department (HOSPITAL_COMMUNITY): Payer: BC Managed Care – PPO

## 2012-10-13 ENCOUNTER — Emergency Department (HOSPITAL_COMMUNITY)
Admission: EM | Admit: 2012-10-13 | Discharge: 2012-10-13 | Disposition: A | Discharge status: Home / Self Care | Payer: BC Managed Care – PPO | Attending: Emergency Medicine | Admitting: Emergency Medicine

## 2012-10-13 ENCOUNTER — Encounter (HOSPITAL_COMMUNITY): Payer: Self-pay | Admitting: Emergency Medicine

## 2012-10-13 DIAGNOSIS — Z885 Allergy status to narcotic agent status: Secondary | ICD-10-CM | POA: Insufficient documentation

## 2012-10-13 DIAGNOSIS — Z8739 Personal history of other diseases of the musculoskeletal system and connective tissue: Secondary | ICD-10-CM | POA: Insufficient documentation

## 2012-10-13 DIAGNOSIS — F3289 Other specified depressive episodes: Secondary | ICD-10-CM | POA: Insufficient documentation

## 2012-10-13 DIAGNOSIS — F329 Major depressive disorder, single episode, unspecified: Secondary | ICD-10-CM | POA: Insufficient documentation

## 2012-10-13 DIAGNOSIS — Z862 Personal history of diseases of the blood and blood-forming organs and certain disorders involving the immune mechanism: Secondary | ICD-10-CM | POA: Insufficient documentation

## 2012-10-13 DIAGNOSIS — R109 Unspecified abdominal pain: Secondary | ICD-10-CM | POA: Insufficient documentation

## 2012-10-13 DIAGNOSIS — I10 Essential (primary) hypertension: Secondary | ICD-10-CM | POA: Insufficient documentation

## 2012-10-13 DIAGNOSIS — Z79899 Other long term (current) drug therapy: Secondary | ICD-10-CM | POA: Insufficient documentation

## 2012-10-13 DIAGNOSIS — Z86711 Personal history of pulmonary embolism: Secondary | ICD-10-CM | POA: Insufficient documentation

## 2012-10-13 DIAGNOSIS — E785 Hyperlipidemia, unspecified: Secondary | ICD-10-CM | POA: Insufficient documentation

## 2012-10-13 DIAGNOSIS — K219 Gastro-esophageal reflux disease without esophagitis: Secondary | ICD-10-CM | POA: Insufficient documentation

## 2012-10-13 DIAGNOSIS — G43909 Migraine, unspecified, not intractable, without status migrainosus: Secondary | ICD-10-CM | POA: Insufficient documentation

## 2012-10-13 DIAGNOSIS — Z8742 Personal history of other diseases of the female genital tract: Secondary | ICD-10-CM | POA: Insufficient documentation

## 2012-10-13 DIAGNOSIS — E669 Obesity, unspecified: Secondary | ICD-10-CM | POA: Insufficient documentation

## 2012-10-13 LAB — URINE MICROSCOPIC-ADD ON

## 2012-10-13 LAB — BASIC METABOLIC PANEL
BUN: 10 mg/dL (ref 6–23)
CO2: 20 mEq/L (ref 19–32)
Calcium: 8.8 mg/dL (ref 8.4–10.5)
Chloride: 105 mEq/L (ref 96–112)
Creatinine, Ser: 0.61 mg/dL (ref 0.50–1.10)
GFR calc Af Amer: >90 mL/min (ref >90)
GFR calc non Af Amer: >90 mL/min (ref >90)
Glucose, Bld: 93 mg/dL (ref 70–99)
Potassium: 3.7 mEq/L (ref 3.5–5.1)
Sodium: 138 mEq/L (ref 135–145)

## 2012-10-13 LAB — CBC WITH DIFFERENTIAL/PLATELET
Basophils Absolute: 0 10*3/uL (ref 0.0–0.1)
Basophils Relative: 0 % (ref 0–1)
Eosinophils Absolute: 0.2 10*3/uL (ref 0.0–0.7)
Eosinophils Relative: 2 % (ref 0–5)
HCT: 30.1 % — ABNORMAL LOW (ref 36.0–46.0)
Hemoglobin: 8.8 g/dL — ABNORMAL LOW (ref 12.0–15.0)
Lymphocytes Relative: 15 % (ref 12–46)
Lymphs Abs: 1.8 10*3/uL (ref 0.7–4.0)
MCH: 20.5 pg — ABNORMAL LOW (ref 26.0–34.0)
MCHC: 29.2 g/dL — ABNORMAL LOW (ref 30.0–36.0)
MCV: 70 fL — ABNORMAL LOW (ref 78.0–100.0)
Monocytes Absolute: 0.9 10*3/uL (ref 0.1–1.0)
Monocytes Relative: 7 % (ref 3–12)
Neutro Abs: 9.4 10*3/uL — ABNORMAL HIGH (ref 1.7–7.7)
Neutrophils Relative %: 76 % (ref 43–77)
Platelets: 324 10*3/uL (ref 150–400)
RBC: 4.3 MIL/uL (ref 3.87–5.11)
RDW: 18.8 % — ABNORMAL HIGH (ref 11.5–15.5)
WBC: 12.3 10*3/uL — ABNORMAL HIGH (ref 4.0–10.5)

## 2012-10-13 LAB — URINALYSIS, ROUTINE W REFLEX MICROSCOPIC
Bilirubin Urine: NEGATIVE
Glucose, UA: NEGATIVE mg/dL
Hgb urine dipstick: NEGATIVE
Ketones, ur: NEGATIVE mg/dL
Nitrite: NEGATIVE
Protein, ur: 30 mg/dL — AB
Specific Gravity, Urine: 1.022 (ref 1.005–1.030)
Urobilinogen, UA: 0.2 mg/dL (ref 0.0–1.0)
pH: 6 (ref 5.0–8.0)

## 2012-10-13 MED ORDER — ONDANSETRON HCL 4 MG/2ML IJ SOLN: 4.0000 mg | Freq: Once | INTRAMUSCULAR | Status: DC

## 2012-10-13 MED ORDER — KETOROLAC TROMETHAMINE 30 MG/ML IJ SOLN
30.0000 mg | Freq: Once | INTRAMUSCULAR | Status: AC
  Administered 2012-10-13: 30 mg via INTRAVENOUS
  Filled 2012-10-13: qty 1

## 2012-10-13 NOTE — ED Provider Notes (Signed)
Re-evaluation:  Still mild RLQ and suprapubic abdominal tenderness that is mild. Per patient, "now it feels like ovarian cysts I've had in the past." No N, V. Discussed normal results of Pelvic US with patient and family. Discussed possibility of appendicitis given RLQ pain, although unlikely, and she is given symptoms that would prompt return to ED. She has GYN and PCP outpatient follow up. Comfortable with discharge home. Discussed with Dr. Juleen China who agrees.    Arnoldo Hooker, PA-C 10/13/12 1739

## 2012-10-13 NOTE — ED Provider Notes (Signed)
History     CSN: [id number]  Arrival date & time 10/13/12  1244   First MD Initiated Contact with Patient 10/13/12 1314      Chief Complaint  Patient presents with  . Abdominal Pain    (Consider location/radiation/quality/duration/timing/severity/associated sxs/prior treatment) HPI  52 year old female with history of GERD, polycystic ovaries, myalgias presents complaining of abdominal pain. Patient reports acute onset of sharp stabbing suprapubic abdominal pain which started about 2 hours ago. Patient was walking when she experiencing the sudden onset of sharp low abdominal pain. Pain seemed to be worse in with movement, coughing, or with palpation. Pain did radiate up to the right side of abdomen. Pain feels similar to her of ovarian cyst, however much more intense. Patient states she is due for the next menstrual cycle. Patient has not received any specific treatment. She denies fever, chills, chest pain, shortness of breath, productive cough, back pain, dysuria, hematuria. Does endorse functional vaginal discharge without rash. Her last bowel movement was earlier today and was normal.  Past Medical History  Diagnosis Date  . Hx pulmonary embolism 2006    was on estrogen   . Depression   . Other and unspecified hyperlipidemia   . Essential hypertension, benign   . Cavus deformity of foot, acquired   . Major depressive disorder, single episode, unspecified   . Dysthymic disorder   . Unspecified gastritis and gastroduodenitis without mention of hemorrhage   . Esophageal reflux   . Insomnia, unspecified   . Edema     bilateral  . Migraine, unspecified, without mention of intractable migraine without mention of status migrainosus   . Obesity, unspecified   . Polycystic ovaries   . Anemia, unspecified   . Myalgia and myositis, unspecified     History reviewed. No pertinent past surgical history.  Family History  Problem Relation Age of Onset  . Diabetes Father   . Heart  disease Father   . Learning disabilities Daughter     51 y.o mental capacity  . Liver cancer Father   . Pancreatic cancer Father   . Gallbladder disease Father   . Heart attack Father     x5, first @ 50  . Miscarriages / [place] Mother   . Alzheimer's disease Mother   . ADD / ADHD Daughter     History  Substance Use Topics  . Smoking status: Never Smoker   . Smokeless tobacco: Never Used  . Alcohol Use: 0.0 oz/week    0 Cans of beer per week     Comment: occasional glass of wine, 1/2 beer per month, martini every 3 weeks    OB History   Grav Para Term Preterm Abortions TAB SAB Ect Mult Living                  Review of Systems  Constitutional:       10 Systems reviewed and all are negative for acute change except as noted in the HPI.     Allergies  Celecoxib; Codeine phosphate; Dilaudid; Estrogen; Pravastatin sodium; and Rosuvastatin  Home Medications   Current Outpatient Rx  Name  Route  Sig  Dispense  Refill  . benzonatate (TESSALON) 100 MG capsule   Oral   Take 100 mg by mouth daily.         . Chromium Picolinate 500 MCG CAPS   Oral   Take 1 tablet by mouth daily.         Marland Kitchen ALPRAZolam (XANAX) 0.5 MG tablet  Oral   Take 0.5 mg by mouth 2 (two) times daily as needed. For anxiety           . buPROPion (WELLBUTRIN XL) 150 MG 24 hr tablet      TAKE 1 TABLET BY MOUTH EVERY MORNING   90 tablet   0   . buPROPion (WELLBUTRIN XL) 300 MG 24 hr tablet      TAKE 1 TABLET BY MOUTH EVERY DAY   90 tablet   0   . glucosamine-chondroitin 500-400 MG tablet   Oral   Take 1 tablet by mouth daily.         Marland Kitchen ketoprofen (ORUDIS) 50 MG capsule   Oral   Take 50 mg by mouth as needed.          Marland Kitchen lisinopril (PRINIVIL,ZESTRIL) 5 MG tablet      TAKE 1 TABLET (5 MG TOTAL) BY MOUTH DAILY.   90 tablet   0   . magnesium gluconate (MAGONATE) 500 MG tablet   Oral   Take 500 mg by mouth daily.         . meloxicam (MOBIC) 15 MG tablet   Oral   Take  15 mg by mouth daily as needed.          Marland Kitchen omeprazole (PRILOSEC) 40 MG capsule      TAKE ONE CAPSULE BY MOUTH AS NEEDED   90 capsule   1     BP 145/69  Pulse 78  Temp(Src) 98.4 F (36.9 C) (Oral)  Resp 18  SpO2 98%  Physical Exam  Nursing note and vitals reviewed. Constitutional: She appears well-developed and well-nourished. No distress.  HENT:  Head: Normocephalic and atraumatic.  Eyes: Conjunctivae are normal.  Neck: Normal range of motion. Neck supple.  Cardiovascular: Normal rate and regular rhythm.   Pulmonary/Chest: Effort normal and breath sounds normal. She exhibits no tenderness.  Abdominal: Soft. There is tenderness (Generalized tenderness on palpation throughout abdomen, most significant to her suprapubic and right lower quadrant with guarding but without rebound tenderness). There is guarding. There is no rebound.  Genitourinary: Vagina normal and uterus normal. There is no rash or lesion on the right labia. There is no rash or lesion on the left labia. Cervix exhibits no motion tenderness and no discharge. Right adnexum displays no mass and no tenderness. Left adnexum displays no mass and no tenderness. No erythema, tenderness or bleeding around the vagina. No vaginal discharge found.  Lymphadenopathy:       Right: No inguinal adenopathy present.       Left: No inguinal adenopathy present.  Neurological: She is alert.  Skin: No rash noted.  Psychiatric: She has a normal mood and affect.    ED Course  Procedures (including critical care time)  1:34 PM Acute onset of low abdomen/pelvic pain. History of ovarian cyst. Symptoms suggestive of ruptured ovarian cyst, however we'll perform further workup to rule out any emergent condition.  2:15 PM Pt refused pelvic exam at this time. Pt aware that we cannot check for certain infectious etiology nor can we r/o GU cause of her complaint without pelvic examination.  Pt also report her abd pain has improved after  receiving pain medication.  On reexamination, tenderness noted to R suprapubic/RLQ region, improves from prior.  Will continue to monitor.    3:13 PM Pain has improved after receiving toradol.  Plan to obtain pelvic US.  If normal and pt continues to endorse abd pain, then will obtain abd/pelvis  CT to r/o appy.  Pt aware and agrees with plan.  Pt has appetite, thirsty.  i have low suspicion for appendicitis.    4:23 PM Care discussed with oncoming provider and with attending who is aware of plan.     Labs Reviewed  CBC WITH DIFFERENTIAL - Abnormal; Notable for the following:    WBC 12.3 (*)    Hemoglobin 8.8 (*)    HCT 30.1 (*)    MCV 70.0 (*)    MCH 20.5 (*)    MCHC 29.2 (*)    RDW 18.8 (*)    Neutro Abs 9.4 (*)    All other components within normal limits  URINALYSIS, ROUTINE W REFLEX MICROSCOPIC - Abnormal; Notable for the following:    APPearance CLOUDY (*)    Protein, ur 30 (*)    Leukocytes, UA SMALL (*)    All other components within normal limits  URINE MICROSCOPIC-ADD ON - Abnormal; Notable for the following:    Squamous Epithelial / LPF FEW (*)    Bacteria, UA FEW (*)    Casts HYALINE CASTS (*)    All other components within normal limits  URINE CULTURE  BASIC METABOLIC PANEL   US Transvaginal Non-ob  10/13/2012   *RADIOLOGY REPORT*  Clinical Data: Pelvic pain  TRANSABDOMINAL AND TRANSVAGINAL ULTRASOUND OF PELVIS  Technique:  Both transabdominal and transvaginal ultrasound examinations of the pelvis were performed.  Transabdominal technique was performed for global imaging of the pelvis including uterus, ovaries, adnexal regions, and pelvic cul-de-sac.  It was necessary to proceed with endovaginal exam following the transabdominal exam to visualize the endometrium and ovaries.  Comparison:  No similar recent comparison exam available.  Findings: Uterus:  10.4 x 6.4 x 4.4 cm.  Anteverted, anteflexed.  No focal abnormality.  Endometrium: 1 cm.  Uniformly echogenic without  focal abnormality.  Right ovary: 2.8 x 2.2 x 2.1 cm.  Not well visualized.  No focal abnormality.  Left ovary: 3.6 x 2.7 x 2.1 cm.  Not well visualized.  No focal abnormality.  Other Findings:  No free fluid  IMPRESSION: Normal study.  No evidence of pelvic mass or other significant abnormality.   Original Report Authenticated By: Christiana Pellant, M.D.   US Pelvis Complete  10/13/2012   *RADIOLOGY REPORT*  Clinical Data: Pelvic pain  TRANSABDOMINAL AND TRANSVAGINAL ULTRASOUND OF PELVIS  Technique:  Both transabdominal and transvaginal ultrasound examinations of the pelvis were performed.  Transabdominal technique was performed for global imaging of the pelvis including uterus, ovaries, adnexal regions, and pelvic cul-de-sac.  It was necessary to proceed with endovaginal exam following the transabdominal exam to visualize the endometrium and ovaries.  Comparison:  No similar recent comparison exam available.  Findings: Uterus:  10.4 x 6.4 x 4.4 cm.  Anteverted, anteflexed.  No focal abnormality.  Endometrium: 1 cm.  Uniformly echogenic without focal abnormality.  Right ovary: 2.8 x 2.2 x 2.1 cm.  Not well visualized.  No focal abnormality.  Left ovary: 3.6 x 2.7 x 2.1 cm.  Not well visualized.  No focal abnormality.  Other Findings:  No free fluid  IMPRESSION: Normal study.  No evidence of pelvic mass or other significant abnormality.   Original Report Authenticated By: Christiana Pellant, M.D.     1. Abdominal pain       MDM  BP 145/66  Pulse 63  Temp(Src) 98.4 F (36.9 C) (Oral)  Resp 18  SpO2 100%  I have reviewed nursing notes and vital signs. I personally  reviewed the imaging tests through PACS system  I reviewed available ER/hospitalization records thought the EMR         Fayrene Helper, [place] 10/14/12 1509

## 2012-10-13 NOTE — ED Notes (Signed)
Onset today developed lower Left and right abdominal pain constant pain 9/10 achy sharp.  Stated to EMS history of ovarian cysts however pain in more than usual.

## 2012-10-13 NOTE — Telephone Encounter (Signed)
Patient called to find out what her wbc was on her cbc today. Pt concerned that she may have appendicitis. Pt encouraged to return to ed if still having pain and discomfort.

## 2012-10-14 LAB — URINE CULTURE: Colony Count: 50000

## 2012-10-14 NOTE — ED Provider Notes (Signed)
Medical screening examination/treatment/procedure(s) were performed by non-physician practitioner and as supervising physician I was immediately available for consultation/collaboration.  Raeford Razor, MD 10/14/12 [phone]

## 2012-10-14 NOTE — ED Provider Notes (Signed)
Medical screening examination/treatment/procedure(s) were performed by non-physician practitioner and as supervising physician I was immediately available for consultation/collaboration.  Sadie Pickar, MD 10/14/12 1021 

## 2012-11-18 ENCOUNTER — Telehealth: Payer: Self-pay | Admitting: *Deleted

## 2012-11-18 NOTE — Telephone Encounter (Signed)
Pt reports headache for the past week but increasing problems over the past 3 days with no meds giving relief - will come in tomorrow at 845 for CC appointment. Wyatt Haste, RN-BSN

## 2012-11-19 ENCOUNTER — Ambulatory Visit (INDEPENDENT_AMBULATORY_CARE_PROVIDER_SITE_OTHER): Payer: BC Managed Care – PPO | Admitting: Family Medicine

## 2012-11-19 VITALS — BP 121/84 | HR 78 | Temp 98.6°F | Ht 64.0 in | Wt 259.0 lb

## 2012-11-19 DIAGNOSIS — R51 Headache: Secondary | ICD-10-CM

## 2012-11-19 LAB — CBC WITH DIFFERENTIAL/PLATELET
Basophils Absolute: 0 10*3/uL (ref 0.0–0.1)
Basophils Relative: 1 % (ref 0–1)
Eosinophils Absolute: 0.2 10*3/uL (ref 0.0–0.7)
Eosinophils Relative: 3 % (ref 0–5)
HCT: 31.5 % — ABNORMAL LOW (ref 36.0–46.0)
Hemoglobin: 9.1 g/dL — ABNORMAL LOW (ref 12.0–15.0)
Lymphocytes Relative: 24 % (ref 12–46)
Lymphs Abs: 1.7 10*3/uL (ref 0.7–4.0)
MCH: 20 pg — ABNORMAL LOW (ref 26.0–34.0)
MCHC: 28.9 g/dL — ABNORMAL LOW (ref 30.0–36.0)
MCV: 69.1 fL — ABNORMAL LOW (ref 78.0–100.0)
Monocytes Absolute: 0.6 10*3/uL (ref 0.1–1.0)
Monocytes Relative: 8 % (ref 3–12)
Neutro Abs: 4.4 10*3/uL (ref 1.7–7.7)
Neutrophils Relative %: 64 % (ref 43–77)
Platelets: 434 10*3/uL — ABNORMAL HIGH (ref 150–400)
RBC: 4.56 MIL/uL (ref 3.87–5.11)
RDW: 19.5 % — ABNORMAL HIGH (ref 11.5–15.5)
WBC: 6.8 10*3/uL (ref 4.0–10.5)

## 2012-11-19 LAB — SEDIMENTATION RATE: Sed Rate: 24 mm/hr — ABNORMAL HIGH (ref 0–22)

## 2012-11-19 MED ORDER — AMOXICILLIN 875 MG PO TABS: 875.0000 mg | ORAL_TABLET | Freq: Two times a day (BID) | ORAL | Status: DC

## 2012-11-19 NOTE — Assessment & Plan Note (Signed)
Unclear etiology, precepted with Dr. Mauricio Po.  Ddx includes sinus infection vs dental infection vs trigeminal neuralgia vs temporal arteritis.  Will check ESR and CBC with diff.  Will give 14 day course of amoxicillin in case this is sinus infection.  Pt will f/u in 10-20 days to see if antibiotics cleared this up, or if it returns after abx are completed.  Red flags for sooner return discussed with pt.

## 2012-11-19 NOTE — Progress Notes (Signed)
S: Pt comes in today for SDA for swelling or R face/gland.  She reports that it is more so pain that true swelling.  She started having issues about 1 week ago, and is having pain in the bones of the right side of her face-- upper and lower jaw, ear, cheek bone, and eye brown bone.  Feels somewhat similar to when she has had sinus infections in the past, but usually lower jaw does not hurt.  Is not having any tooth pain, is able to chew and bite down without pain or issues.  She also has some tenderness on the right side under her jaw where she thinks she has a swollen gland.  Denies fever, but did start having some congestion/post-nasal drip yesterday.  No fevers/chills, no N/V/D, no ST or difficulty swallowing, no pain with eye movements, no vision changes, no facial numbness/tingling, no facial weakness, no difficulties chewing or closing her eyes.  Does feel some eye pressure.  Pain is a constant ache, is not worsened by position changes or bending forward.  Has pain worsened with pushing on temple or ear and underneath her jaw line.  She has been having right sided headache, that has been getting worse over the past week.  She did have a tooth pulled on the LEFT side in March, and she had some left over amoxicillin, which she took.  This seemed to help the pain but she finished it yesterday. She tried motrin, which didn't really help, and ketaprofen, which seems to help a little.  Abx help the most.   ROS: Per HPI  History  Smoking status  . Never Smoker   Smokeless tobacco  . Never Used    O:  Filed Vitals:   11/19/12 0848  BP: 121/84  Pulse: 78  Temp: 98.6 F (37 C)    Gen: NAD HEENT: MMM, poor dentition but without obvious abscess or gum inflammation, no tooth tenderness with palpation, no pharyngeal erythema or exudate, moderate anterior cervical LAD, mild TTP just anterior to R temple and mild ear pain on right with exam, otherwise normal exam with normal TMs bilaterally, no sinus  TTP, no rash or facial erythema, sensation to light touch intact throughout face CV: RRR, no murmur Pulm: CTA bilat, no wheezes or crackles   A/P: 52 y.o. female p/w facial pain -See problem list -f/u in 2 weeks

## 2012-11-19 NOTE — Patient Instructions (Addendum)
It was nice to meet you today.  I'm not 100% sure what is causing your facial pain.  It is most likely from an infection, so we will try amoxicillin 2 times per day for 14 days.  I am checking a few labs-- we will call if anything is abnormal or if it changes what we are thinking.  Come back in 2 weeks just to check in with your PCP.  Come back sooner if you start having fevers, worsening pain, difficulty swallowing, vision changes, numbness/tingling, or anything else that is concerning to you.

## 2012-11-20 ENCOUNTER — Encounter: Payer: Self-pay | Admitting: Family Medicine

## 2013-04-03 ENCOUNTER — Encounter: Payer: Self-pay | Admitting: Internal Medicine

## 2013-05-12 ENCOUNTER — Other Ambulatory Visit: Payer: Self-pay | Admitting: Family Medicine

## 2013-05-12 DIAGNOSIS — Z1231 Encounter for screening mammogram for malignant neoplasm of breast: Secondary | ICD-10-CM

## 2013-05-18 ENCOUNTER — Ambulatory Visit (AMBULATORY_SURGERY_CENTER): Payer: Self-pay

## 2013-05-18 VITALS — Ht 64.0 in | Wt 249.0 lb

## 2013-05-18 DIAGNOSIS — Z1211 Encounter for screening for malignant neoplasm of colon: Secondary | ICD-10-CM

## 2013-05-18 MED ORDER — MOVIPREP 100 G PO SOLR: 1.0000 | Freq: Once | ORAL | Status: DC

## 2013-05-25 ENCOUNTER — Encounter: Payer: Self-pay | Admitting: Internal Medicine

## 2013-05-29 ENCOUNTER — Ambulatory Visit (HOSPITAL_COMMUNITY): Payer: BC Managed Care – PPO

## 2013-06-04 ENCOUNTER — Ambulatory Visit (AMBULATORY_SURGERY_CENTER): Payer: BC Managed Care – PPO | Admitting: Internal Medicine

## 2013-06-04 ENCOUNTER — Encounter: Payer: Self-pay | Admitting: Internal Medicine

## 2013-06-04 VITALS — BP 112/73 | HR 68 | Temp 96.4°F | Resp 22 | Ht 64.0 in | Wt 249.0 lb

## 2013-06-04 DIAGNOSIS — Z1211 Encounter for screening for malignant neoplasm of colon: Secondary | ICD-10-CM

## 2013-06-04 MED ORDER — SODIUM CHLORIDE 0.9 % IV SOLN: 500.0000 mL | INTRAVENOUS | Status: DC

## 2013-06-04 NOTE — Progress Notes (Signed)
Pt. Unable to pass a lot of air after receiving dicylomine.  She went to bathroom to try to to pass some air.  She has small amt. Blood on toliet tissue an Was advised this was not abnormal.  Advised to call if increased bleeding- to fill commode.

## 2013-06-04 NOTE — Progress Notes (Signed)
Lidocaine-40mg IV prior to Propofol InductionPropofol given over incremental dosages 

## 2013-06-04 NOTE — Op Note (Signed)
[facility] [address] [place], [phone]   COLONOSCOPY PROCEDURE REPORT  PATIENT: Ann Castillo, Ann K.  MR#: [id number] BIRTHDATE: 1960/08/20 , 52  yrs. old GENDER: Female ENDOSCOPIST: Roxy CedarJohn N Raymondo Garcialopez Jr, MD REFERRED NF:AOZHYBY:Sarah SwazilandJordan, M.D. PROCEDURE DATE:  06/04/2013 PROCEDURE:   Colonoscopy, screening First Screening Colonoscopy - Avg.  risk and is 50 yrs.  old or older Yes.  Prior Negative Screening - Now for repeat screening. N/A  History of Adenoma - Now for follow-up colonoscopy & has been > or = to 3 yrs.  N/A  Polyps Removed Today? No.  Recommend repeat exam, <10 yrs? No. ASA CLASS:   Class II INDICATIONS:average risk screening. MEDICATIONS: MAC sedation, administered by CRNA and propofol (Diprivan) 300mg  IV  DESCRIPTION OF PROCEDURE:   After the risks benefits and alternatives of the procedure were thoroughly explained, informed consent was obtained.  A digital rectal exam revealed no abnormalities of the rectum.   The LB [id number]  endoscope was introduced through the anus and advanced to the cecum, which was identified by both the appendix and ileocecal valve. No adverse events experienced.   The quality of the prep was excellent, using MoviPrep  The instrument was then slowly withdrawn as the colon was fully examined.   COLON FINDINGS: A normal appearing cecum, ileocecal valve, and appendiceal orifice were identified.  The ascending, hepatic flexure, transverse, splenic flexure, descending, sigmoid colon and rectum appeared unremarkable.  No polyps or cancers were seen. Retroflexed views revealed no abnormalities. The time to cecum=1 minutes 52 seconds.  Withdrawal time=8 minutes 10 seconds.  The scope was withdrawn and the procedure completed. COMPLICATIONS: There were no complications.  ENDOSCOPIC IMPRESSION: Normal colon  RECOMMENDATIONS: Continue current colorectal screening recommendations for "routine risk" patients with a  repeat colonoscopy in 10 years.   eSigned:  Roxy CedarJohn N Raelene Trew Jr, MD 06/04/2013 12:48 PM   cc: Sarah SwazilandJordan, MD and The Patient

## 2013-06-04 NOTE — Patient Instructions (Signed)
YOU HAD AN ENDOSCOPIC PROCEDURE TODAY AT THE [facility]: Refer to the procedure report that was given to you for any specific questions about what was found during the examination.  If the procedure report does not answer your questions, please call your gastroenterologist to clarify.  If you requested that your care partner not be given the details of your procedure findings, then the procedure report has been included in a sealed envelope for you to review at your convenience later.  YOU SHOULD EXPECT: Some feelings of bloating in the abdomen. Passage of more gas than usual.  Walking can help get rid of the air that was put into your GI tract during the procedure and reduce the bloating. If you had a lower endoscopy (such as a colonoscopy or flexible sigmoidoscopy) you may notice spotting of blood in your stool or on the toilet paper. If you underwent a bowel prep for your procedure, then you may not have a normal bowel movement for a few days.  DIET: Your first meal following the procedure should be a light meal and then it is ok to progress to your normal diet.  A half-sandwich or bowl of soup is an example of a good first meal.  Heavy or fried foods are harder to digest and may make you feel nauseous or bloated.  Likewise meals heavy in dairy and vegetables can cause extra gas to form and this can also increase the bloating.  Drink plenty of fluids but you should avoid alcoholic beverages for 24 hours.  ACTIVITY: Your care partner should take you home directly after the procedure.  You should plan to take it easy, moving slowly for the rest of the day.  You can resume normal activity the day after the procedure however you should NOT DRIVE or use heavy machinery for 24 hours (because of the sedation medicines used during the test).    SYMPTOMS TO REPORT IMMEDIATELY: A gastroenterologist can be reached at any hour.  During normal business hours, 8:30 AM to 5:00 PM Monday through Friday,  call [phone].  After hours and on weekends, please call the GI answering service at [phone] who will take a message and have the physician on call contact you.   Following lower endoscopy (colonoscopy or flexible sigmoidoscopy):  Excessive amounts of blood in the stool  Significant tenderness or worsening of abdominal pains  Swelling of the abdomen that is new, acute  Fever of 100F or higher    FOLLOW UP: If any biopsies were taken you will be contacted by phone or by letter within the next 1-3 weeks.  Call your gastroenterologist if you have not heard about the biopsies in 3 weeks.  Our staff will call the home number listed on your records the next business day following your procedure to check on you and address any questions or concerns that you may have at that time regarding the information given to you following your procedure. This is a courtesy call and so if there is no answer at the home number and we have not heard from you through the emergency physician on call, we will assume that you have returned to your regular daily activities without incident.  SIGNATURES/CONFIDENTIALITY: You and/or your care partner have signed paperwork which will be entered into your electronic medical record.  These signatures attest to the fact that that the information above on your After Visit Summary has been reviewed and is understood.  Full responsibility of the confidentiality   of this discharge information lies with you and/or your care-partner.  Normal colonscopy.  Repeat in 10 years-2025

## 2013-06-05 ENCOUNTER — Telehealth: Payer: Self-pay

## 2013-06-05 NOTE — Telephone Encounter (Signed)
  Follow up Call-  Call back number 06/04/2013  Post procedure Call Back phone  # [phone]  Permission to leave phone message Yes     Patient questions:  Do you have a fever, pain , or abdominal swelling? no Pain Score  0 *  Have you tolerated food without any problems? yes  Have you been able to return to your normal activities? yes  Do you have any questions about your discharge instructions: Diet   no Medications  no Follow up visit  no  Do you have questions or concerns about your Care? no  Actions: * If pain score is 4 or above: No action needed, pain <4. Patient states having problems expelling air. Instructed to drink warm fluids continue to move around and to call back if symptoms worsen.

## 2014-05-31 ENCOUNTER — Emergency Department (HOSPITAL_BASED_OUTPATIENT_CLINIC_OR_DEPARTMENT_OTHER): Payer: BLUE CROSS/BLUE SHIELD

## 2014-05-31 ENCOUNTER — Emergency Department (HOSPITAL_BASED_OUTPATIENT_CLINIC_OR_DEPARTMENT_OTHER)
Admission: EM | Admit: 2014-05-31 | Discharge: 2014-06-01 | Disposition: A | Discharge status: Home / Self Care | Payer: BLUE CROSS/BLUE SHIELD | Attending: Emergency Medicine | Admitting: Emergency Medicine

## 2014-05-31 ENCOUNTER — Encounter (HOSPITAL_BASED_OUTPATIENT_CLINIC_OR_DEPARTMENT_OTHER): Payer: Self-pay | Admitting: *Deleted

## 2014-05-31 DIAGNOSIS — Z86711 Personal history of pulmonary embolism: Secondary | ICD-10-CM | POA: Diagnosis not present

## 2014-05-31 DIAGNOSIS — G43909 Migraine, unspecified, not intractable, without status migrainosus: Secondary | ICD-10-CM | POA: Diagnosis not present

## 2014-05-31 DIAGNOSIS — Q667 Congenital pes cavus: Secondary | ICD-10-CM | POA: Insufficient documentation

## 2014-05-31 DIAGNOSIS — G4700 Insomnia, unspecified: Secondary | ICD-10-CM | POA: Diagnosis not present

## 2014-05-31 DIAGNOSIS — Z91040 Latex allergy status: Secondary | ICD-10-CM | POA: Diagnosis not present

## 2014-05-31 DIAGNOSIS — K219 Gastro-esophageal reflux disease without esophagitis: Secondary | ICD-10-CM | POA: Diagnosis not present

## 2014-05-31 DIAGNOSIS — F329 Major depressive disorder, single episode, unspecified: Secondary | ICD-10-CM | POA: Diagnosis not present

## 2014-05-31 DIAGNOSIS — Z79899 Other long term (current) drug therapy: Secondary | ICD-10-CM | POA: Insufficient documentation

## 2014-05-31 DIAGNOSIS — Z862 Personal history of diseases of the blood and blood-forming organs and certain disorders involving the immune mechanism: Secondary | ICD-10-CM | POA: Diagnosis not present

## 2014-05-31 DIAGNOSIS — R251 Tremor, unspecified: Secondary | ICD-10-CM | POA: Diagnosis present

## 2014-05-31 DIAGNOSIS — I10 Essential (primary) hypertension: Secondary | ICD-10-CM | POA: Insufficient documentation

## 2014-05-31 DIAGNOSIS — E669 Obesity, unspecified: Secondary | ICD-10-CM | POA: Insufficient documentation

## 2014-05-31 DIAGNOSIS — E785 Hyperlipidemia, unspecified: Secondary | ICD-10-CM | POA: Insufficient documentation

## 2014-05-31 LAB — COMPREHENSIVE METABOLIC PANEL
ALBUMIN: 4.4 g/dL (ref 3.5–5.2)
ALK PHOS: 50 U/L (ref 39–117)
ALT: 65 U/L — AB (ref 0–35)
AST: 36 U/L (ref 0–37)
Anion gap: 10 (ref 5–15)
BUN: 20 mg/dL (ref 6–23)
CALCIUM: 9.4 mg/dL (ref 8.4–10.5)
CO2: 25 mmol/L (ref 19–32)
Chloride: 104 mEq/L (ref 96–112)
Creatinine, Ser: 0.71 mg/dL (ref 0.50–1.10)
GFR calc Af Amer: >90 mL/min (ref >90)
GFR calc non Af Amer: >90 mL/min (ref >90)
GLUCOSE: 113 mg/dL — AB (ref 70–99)
POTASSIUM: 3.7 mmol/L (ref 3.5–5.1)
SODIUM: 139 mmol/L (ref 135–145)
TOTAL PROTEIN: 7.5 g/dL (ref 6.0–8.3)
Total Bilirubin: 0.6 mg/dL (ref 0.3–1.2)

## 2014-05-31 LAB — CBC WITH DIFFERENTIAL/PLATELET
BASOS PCT: 0 % (ref 0–1)
Basophils Absolute: 0 10*3/uL (ref 0.0–0.1)
Eosinophils Absolute: 0.1 10*3/uL (ref 0.0–0.7)
Eosinophils Relative: 1 % (ref 0–5)
HCT: 41 % (ref 36.0–46.0)
HEMOGLOBIN: 13.8 g/dL (ref 12.0–15.0)
LYMPHS ABS: 2 10*3/uL (ref 0.7–4.0)
LYMPHS PCT: 20 % (ref 12–46)
MCH: 30.9 pg (ref 26.0–34.0)
MCHC: 33.7 g/dL (ref 30.0–36.0)
MCV: 91.7 fL (ref 78.0–100.0)
MONOS PCT: 8 % (ref 3–12)
Monocytes Absolute: 0.8 10*3/uL (ref 0.1–1.0)
Neutro Abs: 6.9 10*3/uL (ref 1.7–7.7)
Neutrophils Relative %: 71 % (ref 43–77)
PLATELETS: 300 10*3/uL (ref 150–400)
RBC: 4.47 MIL/uL (ref 3.87–5.11)
RDW: 12.3 % (ref 11.5–15.5)
WBC: 9.9 10*3/uL (ref 4.0–10.5)

## 2014-05-31 LAB — CBG MONITORING, ED: GLUCOSE-CAPILLARY: 92 mg/dL (ref 70–99)

## 2014-05-31 LAB — TROPONIN I: Troponin I: <0.03 ng/mL (ref <0.031)

## 2014-05-31 MED ORDER — SODIUM CHLORIDE 0.9 % IV BOLUS (SEPSIS)
1000.0000 mL | Freq: Once | INTRAVENOUS | Status: AC
  Administered 2014-05-31: 1000 mL via INTRAVENOUS

## 2014-05-31 NOTE — ED Notes (Signed)
Dr. Littie Deeds notified of pt.  No CT scan at this time.

## 2014-05-31 NOTE — ED Notes (Signed)
Pt reports that she went to the gym tonight and got hot and then cold sweats.  States that she felt weird and after class, was flushed and was shaking.  Pt speaking very slowly, no word salad, slurred speech noted.  Pt ambulatory.  No shaking noted when pt sitting.  pts husband reports pts speech is abnormal.

## 2014-05-31 NOTE — ED Provider Notes (Signed)
CSN: [id number]     Arrival date & time 05/31/14  2049 History   First MD Initiated Contact with Patient 05/31/14 2212     Chief Complaint  Patient presents with  . Shaking     (Consider location/radiation/quality/duration/timing/severity/associated sxs/prior Treatment) Patient is a 54 y.o. female presenting with general illness.  Illness Location:  Generalized Quality:  Tremulousness, diaphoresis Severity:  Moderate Onset quality:  Gradual Duration:  1 day Timing:  Constant Progression:  Resolved Chronicity:  Recurrent Context:  Pt was at gym exercising, had no symptoms throughout, began to have symptoms and continued to work out with no change Relieved by:  Nothing Worsened by:  Nothing Associated symptoms: fatigue   Associated symptoms: no abdominal pain, no chest pain, no cough, no diarrhea, no fever, no headaches, no loss of consciousness, no myalgias, no nausea, no shortness of breath and no vomiting     Past Medical History  Diagnosis Date  . Hx pulmonary embolism 2006    was on estrogen   . Depression   . Other and unspecified hyperlipidemia   . Essential hypertension, benign   . Cavus deformity of foot, acquired   . Major depressive disorder, single episode, unspecified   . Dysthymic disorder   . Unspecified gastritis and gastroduodenitis without mention of hemorrhage   . Esophageal reflux   . Insomnia, unspecified   . Edema     bilateral  . Migraine, unspecified, without mention of intractable migraine without mention of status migrainosus   . Obesity, unspecified   . Polycystic ovaries   . Anemia, unspecified   . Myalgia and myositis, unspecified    History reviewed. No pertinent past surgical history. Family History  Problem Relation Age of Onset  . Diabetes Father   . Heart disease Father   . Liver cancer Father   . Pancreatic cancer Father   . Gallbladder disease Father   . Heart attack Father     x5, first @ 78  . Learning disabilities Daughter      70 y.o mental capacity  . Miscarriages / [place] Mother   . Alzheimer's disease Mother   . ADD / ADHD Daughter   . Colon cancer Neg Hx    History  Substance Use Topics  . Smoking status: Never Smoker   . Smokeless tobacco: Never Used  . Alcohol Use: 0.0 oz/week    0.5 Glasses of wine per week     Comment: occasional glass of wine, 1/2 beer per month, martini every 3 weeks   OB History    No data available     Review of Systems  Constitutional: Positive for fatigue. Negative for fever.  Respiratory: Negative for cough and shortness of breath.   Cardiovascular: Negative for chest pain.  Gastrointestinal: Negative for nausea, vomiting, abdominal pain and diarrhea.  Musculoskeletal: Negative for myalgias.  Neurological: Negative for loss of consciousness and headaches.  All other systems reviewed and are negative.     Allergies  Celecoxib; Codeine phosphate; Dilaudid; Estrogen; Latex; Pravastatin sodium; and Rosuvastatin  Home Medications   Prior to Admission medications   Medication Sig Start Date End Date Taking? Authorizing Provider  ALPRAZolam Prudy Feeler) 0.5 MG tablet Take 0.5 mg by mouth 2 (two) times daily as needed. For anxiety      Historical Provider, MD  benzonatate (TESSALON) 100 MG capsule Take 100 mg by mouth daily.    Historical Provider, MD  ketoprofen (ORUDIS) 50 MG capsule Take 50 mg by mouth as needed for pain.  01/05/11   Historical Provider, MD  lisinopril (PRINIVIL,ZESTRIL) 5 MG tablet TAKE 1 TABLET (5 MG TOTAL) BY MOUTH DAILY. 11/27/11   Sarah T [place], MD  lisinopril-hydrochlorothiazide Marcell Anger) 10-12.5 MG per tablet  05/15/13   Historical Provider, MD  magnesium gluconate (MAGONATE) 500 MG tablet Take 500 mg by mouth daily.    Historical Provider, MD  meloxicam (MOBIC) 15 MG tablet Take 15 mg by mouth daily as needed for pain.  02/05/11   Historical Provider, MD  omeprazole (PRILOSEC) 40 MG capsule  05/15/13   Historical Provider, MD   OVER THE COUNTER MEDICATION     Historical Provider, MD   BP 126/67 mmHg  Pulse 76  Temp(Src) 98.6 F (37 C) (Oral)  Resp 18  Ht 5\' 4"  (1.626 m)  Wt 260 lb (117.935 kg)  BMI 44.61 kg/m2  SpO2 97% Physical Exam  Constitutional: She is oriented to person, place, and time. She appears well-developed and well-nourished.  HENT:  Head: Normocephalic and atraumatic.  Right Ear: External ear normal.  Left Ear: External ear normal.  Eyes: Conjunctivae and EOM are normal. Pupils are equal, round, and reactive to light.  Neck: Normal range of motion. Neck supple.  Cardiovascular: Normal rate, regular rhythm, normal heart sounds and intact distal pulses.   Pulmonary/Chest: Effort normal and breath sounds normal.  Abdominal: Soft. Bowel sounds are normal. There is no tenderness.  Musculoskeletal: Normal range of motion.  Neurological: She is alert and oriented to person, place, and time. She has normal strength and normal reflexes. No cranial nerve deficit or sensory deficit. Gait normal. GCS eye subscore is 4. GCS verbal subscore is 5. GCS motor subscore is 6.  Skin: Skin is warm and dry.  Vitals reviewed.   ED Course  Procedures (including critical care time) Labs Review Labs Reviewed  COMPREHENSIVE METABOLIC PANEL - Abnormal; Notable for the following:    Glucose, Bld 113 (*)    ALT 65 (*)    All other components within normal limits  URINALYSIS, ROUTINE W REFLEX MICROSCOPIC - Abnormal; Notable for the following:    Specific Gravity, Urine 1.003 (*)    Hgb urine dipstick TRACE (*)    Leukocytes, UA TRACE (*)    All other components within normal limits  CBC WITH DIFFERENTIAL  TROPONIN I  URINE MICROSCOPIC-ADD ON  CBG MONITORING, ED    Imaging Review Dg Chest 2 View  05/31/2014   CLINICAL DATA:  Hot and cold sweats after the gym tonight. Flushed in shaking feeling. Speaking slowly and difficult to form words. Left chest pain yesterday.  EXAM: CHEST  2 VIEW  COMPARISON:   09/19/2011  FINDINGS: The heart size and mediastinal contours are within normal limits. Both lungs are clear. The visualized skeletal structures are unremarkable.  IMPRESSION: No active cardiopulmonary disease.   Electronically Signed   By: Burman Nieves M.D.   On: 05/31/2014 23:38   Ct Head Wo Contrast  05/31/2014   CLINICAL DATA:  Hot and cold sweats, flushed and shaking feeling. Speaking slowly and a difficulty forming words.  EXAM: CT HEAD WITHOUT CONTRAST  TECHNIQUE: Contiguous axial images were obtained from the base of the skull through the vertex without intravenous contrast.  COMPARISON:  None.  FINDINGS: Ventricles and sulci appear symmetrical. No mass effect or midline shift. No abnormal extra-axial fluid collections. Gray-white matter junctions are distinct. Basal cisterns are not effaced. No evidence of acute intracranial hemorrhage. No depressed skull fractures. Visualized paranasal sinuses and mastoid air cells are  not opacified.  IMPRESSION: No acute intracranial abnormalities.  Normal examination.   Electronically Signed   By: Burman Nieves M.D.   On: 05/31/2014 23:45     EKG Interpretation None      MDM   Final diagnoses:  Tremulousness    54 y.o. female with pertinent PMH of depression, prior PE in 2006, no following symptoms presents with malaise and tremulousness after working out today.  No symptoms on initial exertion, and pt exerted herself after onset of symptoms without change.  She was given orange juice with improvement.  She was concerned that she was unable to clearly express herself, so presented for further evaluation.  No HA or lateralizing neuro symptoms. On arrival vitals and physical exam as above.  No focal neuro deficits, and pt well appearing.  Doubt ACS, PE, TIA, CVA given isolated malaise and tremulousness.  Discussed strict return precautions for worsening symptoms, any change.  Pt refused delta troponin.  Discussed that this could miss occult ACS, but  pt continued to refuse.  DC home in stable condition.    I have reviewed all laboratory and imaging studies if ordered as above  1. Tremulousness         Mirian Mo, MD 06/01/14 [phone]

## 2014-06-01 LAB — URINALYSIS, ROUTINE W REFLEX MICROSCOPIC
Bilirubin Urine: NEGATIVE
Glucose, UA: NEGATIVE mg/dL
Ketones, ur: NEGATIVE mg/dL
Nitrite: NEGATIVE
Protein, ur: NEGATIVE mg/dL
SPECIFIC GRAVITY, URINE: 1.003 — AB (ref 1.005–1.030)
Urobilinogen, UA: 0.2 mg/dL (ref 0.0–1.0)
pH: 6 (ref 5.0–8.0)

## 2014-06-01 LAB — URINE MICROSCOPIC-ADD ON

## 2014-06-01 NOTE — Discharge Instructions (Signed)

## 2014-06-02 ENCOUNTER — Emergency Department (HOSPITAL_COMMUNITY)
Admission: EM | Admit: 2014-06-02 | Discharge: 2014-06-02 | Disposition: A | Discharge status: Home / Self Care | Payer: BLUE CROSS/BLUE SHIELD | Attending: Emergency Medicine | Admitting: Emergency Medicine

## 2014-06-02 ENCOUNTER — Encounter (HOSPITAL_COMMUNITY): Payer: Self-pay | Admitting: Family Medicine

## 2014-06-02 DIAGNOSIS — K219 Gastro-esophageal reflux disease without esophagitis: Secondary | ICD-10-CM | POA: Insufficient documentation

## 2014-06-02 DIAGNOSIS — F8081 Childhood onset fluency disorder: Secondary | ICD-10-CM

## 2014-06-02 DIAGNOSIS — R4701 Aphasia: Secondary | ICD-10-CM | POA: Diagnosis present

## 2014-06-02 DIAGNOSIS — Z86711 Personal history of pulmonary embolism: Secondary | ICD-10-CM | POA: Insufficient documentation

## 2014-06-02 DIAGNOSIS — Z8669 Personal history of other diseases of the nervous system and sense organs: Secondary | ICD-10-CM | POA: Insufficient documentation

## 2014-06-02 DIAGNOSIS — F329 Major depressive disorder, single episode, unspecified: Secondary | ICD-10-CM | POA: Insufficient documentation

## 2014-06-02 DIAGNOSIS — R251 Tremor, unspecified: Secondary | ICD-10-CM | POA: Diagnosis not present

## 2014-06-02 DIAGNOSIS — E669 Obesity, unspecified: Secondary | ICD-10-CM | POA: Diagnosis not present

## 2014-06-02 DIAGNOSIS — G43909 Migraine, unspecified, not intractable, without status migrainosus: Secondary | ICD-10-CM | POA: Diagnosis not present

## 2014-06-02 DIAGNOSIS — R130 Aphagia: Secondary | ICD-10-CM | POA: Diagnosis not present

## 2014-06-02 DIAGNOSIS — Z91040 Latex allergy status: Secondary | ICD-10-CM | POA: Diagnosis not present

## 2014-06-02 DIAGNOSIS — Z79899 Other long term (current) drug therapy: Secondary | ICD-10-CM | POA: Diagnosis not present

## 2014-06-02 DIAGNOSIS — Q667 Congenital pes cavus: Secondary | ICD-10-CM | POA: Diagnosis not present

## 2014-06-02 DIAGNOSIS — Z862 Personal history of diseases of the blood and blood-forming organs and certain disorders involving the immune mechanism: Secondary | ICD-10-CM | POA: Insufficient documentation

## 2014-06-02 DIAGNOSIS — I10 Essential (primary) hypertension: Secondary | ICD-10-CM | POA: Diagnosis not present

## 2014-06-02 LAB — CBG MONITORING, ED: Glucose-Capillary: 94 mg/dL (ref 70–99)

## 2014-06-02 NOTE — Consult Note (Signed)
NEURO HOSPITALIST CONSULT NOTE    Reason for Consult:stuttering  HPI:                                                                                                                                          Ann Castillo is an 54 y.o. female who was feeling fine on Monday until Monday nigth.  She states she did not feel well and suddenly started to have significant diaphoresis on her scalp. Later that night she noted she was having hard time speaking.  She states she felt she had to force her words out. She went to the ED and had a CT head and lab work. All tests returned normal. On return to her house her symptoms resolved.  The next day she had another episode which resolved. Today it returned and family members noted the speech abnormalities seemed to coincide with stress. Prior to entering room she had no problems.  Initially while talking to patient her speech was clear with no abnormalities.  As we talked she would prolong the last syllable.   Past Medical History  Diagnosis Date  . Hx pulmonary embolism 2006    was on estrogen   . Depression   . Other and unspecified hyperlipidemia   . Essential hypertension, benign   . Cavus deformity of foot, acquired   . Major depressive disorder, single episode, unspecified   . Dysthymic disorder   . Unspecified gastritis and gastroduodenitis without mention of hemorrhage   . Esophageal reflux   . Insomnia, unspecified   . Edema     bilateral  . Migraine, unspecified, without mention of intractable migraine without mention of status migrainosus   . Obesity, unspecified   . Polycystic ovaries   . Anemia, unspecified   . Myalgia and myositis, unspecified     History reviewed. No pertinent past surgical history.  Family History  Problem Relation Age of Onset  . Diabetes Father   . Heart disease Father   . Liver cancer Father   . Pancreatic cancer Father   . Gallbladder disease Father   . Heart attack Father      x5, first @ 30  . Learning disabilities Daughter     69 y.o mental capacity  . Miscarriages / [place] Mother   . Alzheimer's disease Mother   . ADD / ADHD Daughter   . Colon cancer Neg Hx      Social History:  reports that she has never smoked. She has never used smokeless tobacco. She reports that she drinks alcohol. She reports that she does not use illicit drugs.  Allergies  Allergen Reactions  . Celecoxib     REACTION: ? anaphylactic reaction 10/11 - throat closing, hand itching 1 1/2 hrs after dose but had taken  this previously without difficulty.  . Codeine Phosphate     REACTION: questionable: mental status changes  . Dilaudid [Hydromorphone Hcl]     Pain  Increases.  blindness  . Estrogen   . Latex   . Pravastatin Sodium     REACTION: upper extremity muscle aches - ? anaphylactic reaction 3 days later 03/07/08  . Rosuvastatin     REACTION: myalgia    MEDICATIONS:                                                                                                                     No current facility-administered medications for this encounter.   Current Outpatient Prescriptions  Medication Sig Dispense Refill  . ALPRAZolam (XANAX) 0.5 MG tablet Take 0.5 mg by mouth 2 (two) times daily as needed. For anxiety      . Collagenase POWD 1 Dose by Does not apply route daily.    Marland Kitchen ibuprofen (ADVIL,MOTRIN) 400 MG tablet Take 400 mg by mouth every 6 (six) hours as needed for fever.    Marland Kitchen lisinopril-hydrochlorothiazide (PRINZIDE,ZESTORETIC) 10-12.5 MG per tablet     . magnesium gluconate (MAGONATE) 500 MG tablet Take 500 mg by mouth daily.    . meloxicam (MOBIC) 15 MG tablet Take 15 mg by mouth daily as needed for pain.     Marland Kitchen omeprazole (PRILOSEC) 40 MG capsule     . saccharomyces boulardii (FLORASTOR) 250 MG capsule Take 250 mg by mouth 2 (two) times daily.    Marland Kitchen lisinopril (PRINIVIL,ZESTRIL) 5 MG tablet TAKE 1 TABLET (5 MG TOTAL) BY MOUTH DAILY. (Patient not taking:  Reported on 06/02/2014) 90 tablet 0      ROS:                                                                                                                                       History obtained from the patient  General ROS: negative for - chills, fatigue, fever, night sweats, weight gain or weight loss Psychological ROS: negative for - behavioral disorder, hallucinations, memory difficulties, mood swings or suicidal ideation Ophthalmic ROS: negative for - blurry vision, double vision, eye pain or loss of vision ENT ROS: negative for - epistaxis, nasal discharge, oral lesions, sore throat, tinnitus or vertigo Allergy and Immunology ROS: negative for - hives or itchy/watery eyes Hematological and Lymphatic ROS: negative for - bleeding problems, bruising or swollen lymph nodes Endocrine  ROS: negative for - galactorrhea, hair pattern changes, polydipsia/polyuria or temperature intolerance Respiratory ROS: negative for - cough, hemoptysis, shortness of breath or wheezing Cardiovascular ROS: negative for - chest pain, dyspnea on exertion, edema or irregular heartbeat Gastrointestinal ROS: negative for - abdominal pain, diarrhea, hematemesis, nausea/vomiting or stool incontinence Genito-Urinary ROS: negative for - dysuria, hematuria, incontinence or urinary frequency/urgency Musculoskeletal ROS: negative for - joint swelling or muscular weakness Neurological ROS: as noted in HPI Dermatological ROS: negative for rash and skin lesion changes   Blood pressure 119/69, pulse 71, temperature 98 F (36.7 C), temperature source Oral, resp. rate 16, SpO2 96 %.   Neurologic Examination:                                                                                                      HEENT-  Normocephalic, no lesions, without obvious abnormality.  Normal external eye and conjunctiva.  Normal TM's bilaterally.  Normal auditory canals and external ears. Normal external nose, mucus membranes and septum.   Normal pharynx. Cardiovascular- S1, S2 normal, pulses palpable throughout   Lungs- chest clear, no wheezing, rales, normal symmetric air entry Abdomen- normal findings: bowel sounds normal Extremities- no edema Lymph-no adenopathy palpable Musculoskeletal-no joint tenderness, deformity or swelling, no muscular tenderness noted Skin-warm and dry, no hyperpigmentation, vitiligo, or suspicious lesions  Neurological Examination Mental Status: Alert, oriented, thought content appropriate.  Speech fluent without evidence of aphasia.  Able to follow 3 step commands without difficulty. Cranial Nerves: II: Discs flat bilaterally; Visual fields grossly normal, pupils equal, round, reactive to light and accommodation III,IV, VI: ptosis not present, extra-ocular motions intact bilaterally V,VII: smile symmetric, facial light touch sensation normal bilaterally VIII: hearing normal bilaterally IX,X: gag reflex present XI: bilateral shoulder shrug XII: midline tongue extension Motor: Right : Upper extremity   5/5    Left:     Upper extremity   5/5  Lower extremity   5/5     Lower extremity   5/5 Tone and bulk:normal tone throughout; no atrophy noted Sensory: Pinprick and light touch intact throughout, bilaterally Deep Tendon Reflexes: 2+ and symmetric throughout Plantars: Right: downgoing   Left: downgoing Cerebellar: normal finger-to-nose, normal rapid alternating movements and normal heel-to-shin test Gait: normal gait and station      Lab Results: Basic Metabolic Panel:  Recent Labs Lab 05/31/14 2310  NA 139  K 3.7  CL 104  CO2 25  GLUCOSE 113*  BUN 20  CREATININE 0.71  CALCIUM 9.4    Liver Function Tests:  Recent Labs Lab 05/31/14 2310  AST 36  ALT 65*  ALKPHOS 50  BILITOT 0.6  PROT 7.5  ALBUMIN 4.4   No results for input(s): LIPASE, AMYLASE in the last 168 hours. No results for input(s): AMMONIA in the last 168 hours.  CBC:  Recent Labs Lab 05/31/14 2310   WBC 9.9  NEUTROABS 6.9  HGB 13.8  HCT 41.0  MCV 91.7  PLT 300    Cardiac Enzymes:  Recent Labs Lab 05/31/14 2310  TROPONINI <0.03    Lipid Panel: No results  for input(s): CHOL, TRIG, HDL, CHOLHDL, VLDL, LDLCALC in the last 168 hours.  CBG:  Recent Labs Lab 05/31/14 2105 06/02/14 1420  GLUCAP 92 94    Microbiology: Results for orders placed or performed during the hospital encounter of 10/13/12  Urine culture     Status: None   Collection Time: 10/13/12  2:06 PM  Result Value Ref Range Status   Specimen Description URINE, CLEAN CATCH  Final   Special Requests NONE  Final   Culture  Setup Time 10/13/2012 15:33  Final   Colony Count 50,000 COLONIES/ML  Final   Culture   Final    Multiple bacterial morphotypes present, none predominant. Suggest appropriate recollection if clinically indicated.   Report Status 10/14/2012 FINAL  Final    Coagulation Studies: No results for input(s): LABPROT, INR in the last 72 hours.  Imaging: Dg Chest 2 View  05/31/2014   CLINICAL DATA:  Hot and cold sweats after the gym tonight. Flushed in shaking feeling. Speaking slowly and difficult to form words. Left chest pain yesterday.  EXAM: CHEST  2 VIEW  COMPARISON:  09/19/2011  FINDINGS: The heart size and mediastinal contours are within normal limits. Both lungs are clear. The visualized skeletal structures are unremarkable.  IMPRESSION: No active cardiopulmonary disease.   Electronically Signed   By: Burman NievesWilliam  Stevens M.D.   On: 05/31/2014 23:38   Ct Head Wo Contrast  05/31/2014   CLINICAL DATA:  Hot and cold sweats, flushed and shaking feeling. Speaking slowly and a difficulty forming words.  EXAM: CT HEAD WITHOUT CONTRAST  TECHNIQUE: Contiguous axial images were obtained from the base of the skull through the vertex without intravenous contrast.  COMPARISON:  None.  FINDINGS: Ventricles and sulci appear symmetrical. No mass effect or midline shift. No abnormal extra-axial fluid  collections. Gray-white matter junctions are distinct. Basal cisterns are not effaced. No evidence of acute intracranial hemorrhage. No depressed skull fractures. Visualized paranasal sinuses and mastoid air cells are not opacified.  IMPRESSION: No acute intracranial abnormalities.  Normal examination.   Electronically Signed   By: Burman NievesWilliam  Stevens M.D.   On: 05/31/2014 23:45       Assessment and plan per attending neurologist  Felicie Mornavid Smith PA-C [facility] [phone]  06/02/2014, 2:24 PM   Assessment/Plan:  54 YO female with waxing and waning prolonged enunciation of her words.  Neuro exam is non-focal.  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    Recommend: 1) Out patient open MRI as patient is very claustrophobic 2) Follow up with out patient neurology.    Elspeth Choeter Eugene Zeiders, DO [facility] [phone]  If 7pm- 7am, please page neurology on call as listed in AMION.

## 2014-06-02 NOTE — ED Notes (Signed)
Pt having ongoing issues with trouble speaking and tremors. Pt stuttering at triage. sts she was recently worked up here. sts the episodes last a few hours

## 2014-06-02 NOTE — ED Notes (Signed)
CBG 94 

## 2014-06-02 NOTE — Discharge Instructions (Signed)
Follow-up with your doctor. Neurology is recommending open MRI to be scheduled as an outpatient. The radiology group here does not have open MRI available. Return for any new or worse symptoms.

## 2014-06-02 NOTE — ED Provider Notes (Signed)
CSN: [id number]     Arrival date & time 06/02/14  1147 History   First MD Initiated Contact with Patient 06/02/14 1201     Chief Complaint  Patient presents with  . Tremors  . Aphasia     (Consider location/radiation/quality/duration/timing/severity/associated sxs/prior Treatment) The history is provided by the patient.   54 year old female with acute onset on January 4 in the morning while at the gym of difficulty speaking and tremors in both hands. Patient seen at [facility] [place] with negative head CT and labs. Patient seen by her primary care doctor and aspirate today and was referred here for further evaluation. Primary care doctor's notes do show that patient will not tolerate MRI, without  sedation. Patient is very fearful of an MRI. Patient does not want try MRI just with mild sedation. The symptoms have persisted. They come and go.  Past Medical History  Diagnosis Date  . Hx pulmonary embolism 2006    was on estrogen   . Depression   . Other and unspecified hyperlipidemia   . Essential hypertension, benign   . Cavus deformity of foot, acquired   . Major depressive disorder, single episode, unspecified   . Dysthymic disorder   . Unspecified gastritis and gastroduodenitis without mention of hemorrhage   . Esophageal reflux   . Insomnia, unspecified   . Edema     bilateral  . Migraine, unspecified, without mention of intractable migraine without mention of status migrainosus   . Obesity, unspecified   . Polycystic ovaries   . Anemia, unspecified   . Myalgia and myositis, unspecified    History reviewed. No pertinent past surgical history. Family History  Problem Relation Age of Onset  . Diabetes Father   . Heart disease Father   . Liver cancer Father   . Pancreatic cancer Father   . Gallbladder disease Father   . Heart attack Father     x5, first @ 69  . Learning disabilities Daughter     61 y.o mental capacity  . Miscarriages / [place] Mother   .  Alzheimer's disease Mother   . ADD / ADHD Daughter   . Colon cancer Neg Hx    History  Substance Use Topics  . Smoking status: Never Smoker   . Smokeless tobacco: Never Used  . Alcohol Use: 0.0 oz/week    0.5 Glasses of wine per week     Comment: occasional glass of wine, 1/2 beer per month, martini every 3 weeks   OB History    No data available     Review of Systems  Constitutional: Negative for fever.  HENT: Negative for congestion.   Eyes: Negative for visual disturbance.  Respiratory: Negative for shortness of breath.   Cardiovascular: Negative for chest pain.  Gastrointestinal: Negative for abdominal pain.  Genitourinary: Negative for dysuria.  Musculoskeletal: Negative for back pain.  Skin: Negative for rash.  Neurological: Positive for tremors and speech difficulty. Negative for seizures, syncope, weakness, numbness and headaches.  Hematological: Does not bruise/bleed easily.  Psychiatric/Behavioral: Negative for confusion.      Allergies  Celecoxib; Codeine phosphate; Dilaudid; Estrogen; Latex; Pravastatin sodium; and Rosuvastatin  Home Medications   Prior to Admission medications   Medication Sig Start Date End Date Taking? Authorizing Provider  ALPRAZolam Prudy Feeler) 0.5 MG tablet Take 0.5 mg by mouth 2 (two) times daily as needed. For anxiety     Yes Historical Provider, MD  Collagenase POWD 1 Dose by Does not apply route daily.  Yes Historical Provider, MD  ibuprofen (ADVIL,MOTRIN) 400 MG tablet Take 400 mg by mouth every 6 (six) hours as needed for fever.   Yes Historical Provider, MD  lisinopril-hydrochlorothiazide (PRINZIDE,ZESTORETIC) 10-12.5 MG per tablet  05/15/13  Yes Historical Provider, MD  magnesium gluconate (MAGONATE) 500 MG tablet Take 500 mg by mouth daily.   Yes Historical Provider, MD  meloxicam (MOBIC) 15 MG tablet Take 15 mg by mouth daily as needed for pain.  02/05/11  Yes Historical Provider, MD  omeprazole (PRILOSEC) 40 MG capsule  05/15/13   Yes Historical Provider, MD  saccharomyces boulardii (FLORASTOR) 250 MG capsule Take 250 mg by mouth 2 (two) times daily.   Yes Historical Provider, MD  lisinopril (PRINIVIL,ZESTRIL) 5 MG tablet TAKE 1 TABLET (5 MG TOTAL) BY MOUTH DAILY. Patient not taking: Reported on 06/02/2014 11/27/11   Sarah T Swaz[place], MD   BP 99/65 mmHg  Pulse 66  Temp(Src) 97.7 F (36.5 C) (Oral)  Resp 15  SpO2 99% Physical Exam  Constitutional: She is oriented to person, place, and time. She appears well-developed and well-nourished. No distress.  HENT:  Head: Normocephalic and atraumatic.  Mouth/Throat: Oropharynx is clear and moist.  Eyes: Conjunctivae and EOM are normal. Pupils are equal, round, and reactive to light.  Neck: Normal range of motion. Neck supple.  Cardiovascular: Normal rate and regular rhythm.   No murmur heard. Pulmonary/Chest: Effort normal and breath sounds normal. No respiratory distress.  Abdominal: Soft. Bowel sounds are normal. There is no tenderness.  Musculoskeletal: Normal range of motion.  Neurological: She is alert and oriented to person, place, and time. No cranial nerve deficit. She exhibits normal muscle tone. Coordination normal.  Except with intermittent difficulty getting words out almost a bit of a stuttering type situation.  Skin: Skin is warm. No erythema.  Nursing note and vitals reviewed.   ED Course  Procedures (including critical care time) Labs Review Labs Reviewed  CBG MONITORING, ED    Imaging Review Dg Chest 2 View  05/31/2014   CLINICAL DATA:  Hot and cold sweats after the gym tonight. Flushed in shaking feeling. Speaking slowly and difficult to form words. Left chest pain yesterday.  EXAM: CHEST  2 VIEW  COMPARISON:  09/19/2011  FINDINGS: The heart size and mediastinal contours are within normal limits. Both lungs are clear. The visualized skeletal structures are unremarkable.  IMPRESSION: No active cardiopulmonary disease.   Electronically Signed   By:  Burman Nieves M.D.   On: 05/31/2014 23:38   Ct Head Wo Contrast  05/31/2014   CLINICAL DATA:  Hot and cold sweats, flushed and shaking feeling. Speaking slowly and a difficulty forming words.  EXAM: CT HEAD WITHOUT CONTRAST  TECHNIQUE: Contiguous axial images were obtained from the base of the skull through the vertex without intravenous contrast.  COMPARISON:  None.  FINDINGS: Ventricles and sulci appear symmetrical. No mass effect or midline shift. No abnormal extra-axial fluid collections. Gray-white matter junctions are distinct. Basal cisterns are not effaced. No evidence of acute intracranial hemorrhage. No depressed skull fractures. Visualized paranasal sinuses and mastoid air cells are not opacified.  IMPRESSION: No acute intracranial abnormalities.  Normal examination.   Electronically Signed   By: Burman Nieves M.D.   On: 05/31/2014 23:45   Results for orders placed or performed during the hospital encounter of 06/02/14  POC CBG, ED  Result Value Ref Range   Glucose-Capillary 94 70 - 99 mg/dL   Comment 1 Documented in Chart  Comment 2 Notify RN       EKG Interpretation None      MDM   Final diagnoses:  Aphagia    Patient seen in consultation with neurology. They feel that there is a good chance this is psychosomatic. However they do recommend outpatient MRI admission was not required. Patient can only handle an open MRI. That will be arranged by her primary care doctor. Patient's blood sugars are fine here patient had labs on January 4 and they were fine.    Vanetta MuldersScott Tris Howell, MD 06/02/14 [phone]

## 2014-06-04 ENCOUNTER — Other Ambulatory Visit: Payer: Self-pay | Admitting: Family Medicine

## 2014-06-04 DIAGNOSIS — R479 Unspecified speech disturbances: Secondary | ICD-10-CM

## 2014-06-04 DIAGNOSIS — R4789 Other speech disturbances: Principal | ICD-10-CM

## 2014-06-14 ENCOUNTER — Ambulatory Visit
Admission: RE | Admit: 2014-06-14 | Discharge: 2014-06-14 | Disposition: A | Discharge status: Home / Self Care | Payer: BLUE CROSS/BLUE SHIELD | Source: Ambulatory Visit | Attending: Family Medicine | Admitting: Family Medicine

## 2014-06-14 DIAGNOSIS — R479 Unspecified speech disturbances: Secondary | ICD-10-CM

## 2014-06-14 DIAGNOSIS — R4789 Other speech disturbances: Principal | ICD-10-CM

## 2014-06-30 ENCOUNTER — Encounter: Payer: Self-pay | Admitting: Neurology

## 2014-06-30 ENCOUNTER — Ambulatory Visit (INDEPENDENT_AMBULATORY_CARE_PROVIDER_SITE_OTHER): Payer: BLUE CROSS/BLUE SHIELD | Admitting: Neurology

## 2014-06-30 VITALS — BP 121/76 | HR 76 | Ht 64.0 in | Wt 269.0 lb

## 2014-06-30 DIAGNOSIS — R413 Other amnesia: Secondary | ICD-10-CM

## 2014-06-30 DIAGNOSIS — R5382 Chronic fatigue, unspecified: Secondary | ICD-10-CM

## 2014-06-30 DIAGNOSIS — F444 Conversion disorder with motor symptom or deficit: Secondary | ICD-10-CM

## 2014-06-30 DIAGNOSIS — R251 Tremor, unspecified: Secondary | ICD-10-CM

## 2014-06-30 DIAGNOSIS — G119 Hereditary ataxia, unspecified: Secondary | ICD-10-CM

## 2014-06-30 NOTE — Progress Notes (Addendum)
[facility]    Provider:  Dr Lucia GaskinsAhern Referring Provider: SwazilandJordan, Sarah T, MD Primary Care Physician:  Burman BlacksmithJORDAN,SARAH T., MD  CC:  Changes in speech pattern   HPI:  Ann Castillo K Higginbotham is a 54 y.o. female here as a referral from Dr. SwazilandJordan for changes in speech pattern associated with increased stress (see addendum below, patient has developed new episodes of LOC with confusion since this appointment). PMHx depression, anxiety.  On the 4th of January she was at the gym, she walking on the treadmill, she didn't feel right, she broke out into a sweat. Her hands were shaking and her speech changed, acute onset including tremor. She went to the bathroom a lot. She went to the ED. They sat in the waiting room for a long time, they forgot her out there, then she waited for hours again, around midnight speech was returning to normal. She woke up the next day and everything was fine. Then had another acute onset of tremor, speech changes, not feeling right, redness in the face. She went to her pcp. She became more agitated and her "speech went south and the tremors started getting bad", she was sent bad to the hospital and it was a bad experience again. The acute episodes are happening 1-2x a day and then resolve. Now it is getting to be more persistent. She wakes up panicking. Symptoms improves after a nap. She has been under a lot of stress. Husband seems to think this is psychiatric, she has a fairly stressful situation at work, demands are high, trying to balance work and life, her energy levels are down, symptoms are waxing and waning with her energy and stress levels. She reports headache and neck stiffness.   Addendum 07/19/2014: Spoke to Patient's psychiatric office. Per DNP Vear ClockLeslie O'Neil, [phone], [facility], patient is reporting new-onset episodes of LOC with prolonged confusion afterwards. They would like an EEG for patient. Will order.   Reviewed notes, labs  and imaging from outside physicians, which showed: She was seen in the ED on 1/4 and 1/6. On 1/4 note in the ED state symptoms got better with OJ, no focal neurologic deficits were noted, no HA,labs, CT of the head and vitals unremarkable, she was sent home. On 1/6 she was again in the ED and  Neurology was consulted. Per Neurology notes, the speech patterns seemed to coincide with stress in the ED and the speech patterns waxed and waned in the ED.Neurology recommended follow up outpatient and MRI of the brain.   MRI of the brain was normal, showed no acute intracranial abnormalities including mass lesion or mass effect, hydrocephalus, extra-axial fluid collection, midline shift, hemorrhage, or acute infarction, large ischemic events (personally reviewed images)  Review of Systems: Patient complains of symptoms per HPI as well as the following symptoms: fatigue, feeling hot, CP, flushing, joint pain, aching muscles, confusion, headache, weakness, dizziness, s;leepiness, rls, anxiety, racing thoughts. Pertinent negatives per HPI. All others negative.   History   Social History  . Marital Status: Married    Spouse Name: N/A    Number of Children: 3  . Years of Education: College   Occupational History  . [employer]    Social History Main Topics  . Smoking status: Never Smoker   . Smokeless tobacco: Never Used  . Alcohol Use: 0.0 oz/week    0.5 Glasses of wine per week     Comment: occasional glass of wine, 1/2 beer per month, martini every 3  weeks  . Drug Use: No  . Sexual Activity: No   Other Topics Concern  . Not on file   Social History Narrative   Married, works as a IT consultant.  She has significant stress from daughter, who is mentally ill and lives at home (age 90).     Caffeine: very rarely     Family History  Problem Relation Age of Onset  . Diabetes Father   . Heart disease Father   . Liver cancer Father   . Pancreatic cancer Father   . Gallbladder disease Father   .  Heart attack Father     x5, first @ 40  . Learning disabilities Daughter     42 y.o mental capacity  . Miscarriages / [place] Mother   . Alzheimer's disease Mother   . ADD / ADHD Daughter   . Colon cancer Neg Hx   . Breast cancer Maternal Grandmother   . Alzheimer's disease Paternal Grandfather   . Alzheimer's disease Maternal Uncle   . Hypertension Son   . Tourette syndrome Son   . Personality disorder Daughter   . ADD / ADHD Daughter     Past Medical History  Diagnosis Date  . Hx pulmonary embolism 2006    was on estrogen   . Depression   . Other and unspecified hyperlipidemia   . Essential hypertension, benign   . Cavus deformity of foot, acquired   . Major depressive disorder, single episode, unspecified   . Dysthymic disorder   . Unspecified gastritis and gastroduodenitis without mention of hemorrhage   . Esophageal reflux   . Insomnia, unspecified   . Edema     bilateral  . Migraine, unspecified, without mention of intractable migraine without mention of status migrainosus   . Obesity, unspecified   . Polycystic ovaries   . Anemia, unspecified   . Myalgia and myositis, unspecified   . Anxiety     History reviewed. No pertinent past surgical history.  Current Outpatient Prescriptions  Medication Sig Dispense Refill  . clonazePAM (KLONOPIN) 0.5 MG tablet Take 0.5 mg by mouth daily.  0  . Collagenase POWD 1 Dose by Does not apply route daily.    Marland Kitchen ibuprofen (ADVIL,MOTRIN) 400 MG tablet Take 400 mg by mouth every 6 (six) hours as needed for fever.    Marland Kitchen lisinopril-hydrochlorothiazide (PRINZIDE,ZESTORETIC) 10-12.5 MG per tablet     . magnesium gluconate (MAGONATE) 500 MG tablet Take 500 mg by mouth daily.    . meloxicam (MOBIC) 15 MG tablet Take 15 mg by mouth daily as needed for pain.     Marland Kitchen omeprazole (PRILOSEC) 40 MG capsule     . saccharomyces boulardii (FLORASTOR) 250 MG capsule Take 250 mg by mouth 2 (two) times daily.    Marland Kitchen ALPRAZolam (XANAX) 0.5 MG tablet  Take 0.5 mg by mouth 2 (two) times daily as needed. For anxiety       No current facility-administered medications for this visit.    Allergies as of 06/30/2014 - Review Complete 06/30/2014  Allergen Reaction Noted  . Celecoxib  03/07/2008  . Codeine phosphate  11/05/2005  . Dilaudid [hydromorphone hcl]  10/13/2012  . Estrogen  08/12/2008  . Latex  10/13/2012  . Pravastatin sodium  03/07/2008  . Rosuvastatin  12/09/2008    Vitals: BP 121/76 mmHg  Pulse 76  Ht  (1.626 m)  Wt 269 lb (122.018 kg)  BMI 46.15 kg/m2 Last Weight:  Wt Readings from Last 1 Encounters:  06/30/14 269  lb (122.018 kg)   Last Height:   Ht Readings from Last 1 Encounters:  06/30/14  (1.626 m)    Physical exam: Exam: Gen: NAD, conversant, well nourised, obese, well groomed                     CV: RRR, no MRG. No Carotid Bruits. No peripheral edema, warm, nontender Eyes: Conjunctivae clear without exudates or hemorrhage  Neuro: Detailed Neurologic Exam  Speech:    Ataxic dysarthria type speech with normal comprehension. Speech returns to normal briefly when distracted. Cognition:    The patient is oriented to person, place, and time;     recent and remote memory intact;     language fluent;     normal attention, concentration,     fund of knowledge Cranial Nerves:    The pupils are equal, round, and reactive to light. The fundi are normal and spontaneous venous pulsations are present. Visual fields are full to finger confrontation. Extraocular movements are intact. Trigeminal sensation is intact and the muscles of mastication are normal. The face is symmetric. The palate elevates in the midline. Hearing intact. Voice is normal. Shoulder shrug is normal. The tongue has normal motion without fasciculations.   Coordination:    Normal finger to nose and heel to shin. Normal rapid alternating movements.   Gait:    Heel-toe and tandem gait are normal.   Motor Observation: Intention,  postural and resting tremor that stops with distraction Tone:    Normal muscle tone.    Posture:    Posture is normal. normal erect    Strength:    Strength is V/V in the upper and lower limbs.      Sensation: intact to LT     Reflex Exam:  DTR's:    Deep tendon reflexes in the upper and lower extremities are normal bilaterally.   Toes:    The toes are downgoing bilaterally.   Clonus:    Clonus is absent.      Assessment/Plan:  HEATHERLY STENNER is a 54 y.o. female here as a referral from Dr. Swaziland for episodic changes in speech pattern associated with increased stress. PMHx depression, anxiety. Patient has a scanning-speech quality and a tremor, both are distractable. Otherwise neuro exam non focal.  Likely functional. However does sound slightly scanning associated with increased tremor so will work up for causes of cerebellar ataxia including labwork (she refused LP). However cerebellum was normal on MRI of the brain.  Naomie Dean, MD  [facility] [address] [place], [place] [address]  Phone [phone] Fax [phone]

## 2014-06-30 NOTE — Patient Instructions (Signed)
Overall you are doing fairly well but I do want to suggest a few things today:   Remember to drink plenty of fluid, eat healthy meals and do not skip any meals. Try to eat protein with a every meal and eat a healthy snack such as fruit or nuts in between meals. Try to keep a regular sleep-wake schedule and try to exercise daily, particularly in the form of walking, 20-30 minutes a day, if you can.   As far as diagnostic testing: Labs  I would like to see you back in 3 months, sooner if we need to. Please call us with any interim questions, concerns, problems, updates or refill requests.   Please also call us for any test results so we can go over those with you on the phone.  My clinical assistant and will answer any of your questions and relay your messages to me and also relay most of my messages to you.   Our phone number is [phone]. We also have an after hours call service for urgent matters and there is a physician on-call for urgent questions. For any emergencies you know to call 911 or go to the nearest emergency room

## 2014-07-03 ENCOUNTER — Encounter: Payer: Self-pay | Admitting: Neurology

## 2014-07-03 DIAGNOSIS — F444 Conversion disorder with motor symptom or deficit: Secondary | ICD-10-CM | POA: Insufficient documentation

## 2014-07-05 LAB — COMPREHENSIVE METABOLIC PANEL
ALBUMIN: 4.2 g/dL (ref 3.5–5.5)
ALK PHOS: 63 IU/L (ref 39–117)
ALT: 69 IU/L — AB (ref 0–32)
AST: 34 IU/L (ref 0–40)
Albumin/Globulin Ratio: 1.8 (ref 1.1–2.5)
BUN/Creatinine Ratio: 30 — ABNORMAL HIGH (ref 9–23)
BUN: 20 mg/dL (ref 6–24)
CHLORIDE: 104 mmol/L (ref 97–108)
CO2: 24 mmol/L (ref 18–29)
CREATININE: 0.67 mg/dL (ref 0.57–1.00)
Calcium: 9.4 mg/dL (ref 8.7–10.2)
GFR calc non Af Amer: 101 mL/min/{1.73_m2} (ref >59)
GFR, EST AFRICAN AMERICAN: 117 mL/min/{1.73_m2} (ref >59)
GLUCOSE: 97 mg/dL (ref 65–99)
Globulin, Total: 2.3 g/dL (ref 1.5–4.5)
POTASSIUM: 4.3 mmol/L (ref 3.5–5.2)
Sodium: 143 mmol/L (ref 134–144)
Total Protein: 6.5 g/dL (ref 6.0–8.5)

## 2014-07-05 LAB — GLIADIN IGG/IGA AB PROF, EIA
Antigliadin Abs, IgA: 13 units (ref 0–19)
Gliadin IgG: 3 units (ref 0–19)

## 2014-07-05 LAB — PARANEOPLASTIC PROFILE 1
Neuronal Nuc Ab (Ri), IFA: <1:10 {titer}
Neuronal Nuclear (Hu) Antibody (IB): <1:10 {titer}

## 2014-07-05 LAB — GAD-65 AUTOANTIBODY: Glutamic Acid Decarb Ab: <5 U/mL (ref 0.0–5.0)

## 2014-07-05 LAB — CERULOPLASMIN: Ceruloplasmin: 20.8 mg/dL (ref 19.0–39.0)

## 2014-07-05 LAB — ANGIOTENSIN CONVERTING ENZYME: Angio Convert Enzyme: <14 U/L — ABNORMAL LOW (ref 14–82)

## 2014-07-05 LAB — HEAVY METALS, BLOOD
Arsenic: 3 ug/L (ref 2–23)
Lead, Blood: NOT DETECTED ug/dL (ref 0–19)
MERCURY: NOT DETECTED ug/L (ref 0.0–14.9)

## 2014-07-05 LAB — B12 AND FOLATE PANEL
Folate: 10.5 ng/mL (ref >3.0)
VITAMIN B 12: 509 pg/mL (ref 211–946)

## 2014-07-05 LAB — ANA W/REFLEX: Anti Nuclear Antibody(ANA): NEGATIVE

## 2014-07-05 LAB — THYROID PANEL WITH TSH
Free Thyroxine Index: 2.1 (ref 1.2–4.9)
T3 Uptake Ratio: 28 % (ref 24–39)
T4 TOTAL: 7.6 ug/dL (ref 4.5–12.0)
TSH: 1.06 u[IU]/mL (ref 0.450–4.500)

## 2014-07-05 LAB — RPR: RPR: NONREACTIVE

## 2014-07-05 LAB — COPPER, SERUM: COPPER: 69 ug/dL — AB (ref 72–166)

## 2014-07-05 LAB — HIV ANTIBODY (ROUTINE TESTING W REFLEX): HIV Screen 4th Generation wRfx: NONREACTIVE

## 2014-07-05 LAB — VITAMIN B1, WHOLE BLOOD: Thiamine: 80.1 nmol/L (ref 66.5–200.0)

## 2014-07-05 LAB — C-REACTIVE PROTEIN: CRP: 3.1 mg/L (ref 0.0–4.9)

## 2014-07-05 LAB — VITAMIN E: Vitamin E (Alpha Tocopherol): 14.2 mg/L (ref 4.6–17.8)

## 2014-07-05 LAB — AMMONIA: AMMONIA: 197 ug/dL — AB (ref 19–87)

## 2014-07-05 LAB — LACTIC ACID, PLASMA: Lactate, Ven: 17.2 mg/dL (ref 4.5–19.8)

## 2014-07-08 ENCOUNTER — Telehealth: Payer: Self-pay | Admitting: Neurology

## 2014-07-08 NOTE — Telephone Encounter (Signed)
I called patient a few times today on home and cell [phone]). They have a mailbox that has not been set up. Called work and it goes to a Print production plann[employer] mailbox.  Kara Meadmma - would you please call patient and let her know that the extensive workup we did for her new-onset speech abnormality was largely normal. I still believe this is likely functional and due to the recent stress she is experiencing.  There were 2 elevated values, ALT and ammonia level,. She should follow up with her pcp regarding those and probably monitor or repeat the labs at a later date.   If you can't reach them tomorrow, please send a letter. Thank you.

## 2014-07-09 ENCOUNTER — Telehealth: Payer: Self-pay | Admitting: *Deleted

## 2014-07-09 NOTE — Telephone Encounter (Signed)
Talked with patient about results. Patient verbalized understanding. I told her also to follow up with her PCP per Dr. Lucia GaskinsAhern instructions.

## 2014-07-16 ENCOUNTER — Telehealth: Payer: Self-pay | Admitting: Neurology

## 2014-07-16 ENCOUNTER — Telehealth: Payer: Self-pay | Admitting: *Deleted

## 2014-07-16 NOTE — Telephone Encounter (Signed)
Patent is calling because she saw her psychiatrist Dr. Verlon AuLeslie O'Neal last week and Dr. Flora Lipps'Neal suggested that patient have an EEG.  Dr. Flora Lipps'Neal thinks since patient stopped taking Klonopin and started taking Lexapro the patient has been having possible seizures. Please call patient and advise. If patient cannot be reached please call her husband Marijean NiemannJaime at [phone]. Thank you.

## 2014-07-16 NOTE — Telephone Encounter (Signed)
Please ask the patient to give me her doctor's name and phone number so that I can call and speak to him. Thank you.

## 2014-07-16 NOTE — Telephone Encounter (Signed)
Talked with patient and got Dr. Merlinda Frederick'neil's phone number so that Dr. Lucia GaskinsAhern can call her doctor and talk with her. The patient gave me [phone] for the doctor's contact number. I told patient Dr. Lucia GaskinsAhern would give her a call back once she talks with her other doctor and to call back with anymore questions.

## 2014-07-19 ENCOUNTER — Other Ambulatory Visit: Payer: Self-pay | Admitting: Neurology

## 2014-07-19 DIAGNOSIS — R569 Unspecified convulsions: Secondary | ICD-10-CM

## 2014-07-19 NOTE — Telephone Encounter (Signed)
I ordered the EEG, please let patient know she can call the office to schedule. thanks

## 2014-07-19 NOTE — Telephone Encounter (Signed)
Spoke to her provider, O'neil. Patient is having episodes of LOC with confusion for 30-60 minutes afterwards. She requests an EEG. Will order. Will you let patient know she can call the office to schedule? thanks

## 2014-07-19 NOTE — Telephone Encounter (Signed)
The recheck lab results are in from her PCP. Patient is also wanting to know about the EEG

## 2014-07-20 ENCOUNTER — Ambulatory Visit (INDEPENDENT_AMBULATORY_CARE_PROVIDER_SITE_OTHER): Payer: BLUE CROSS/BLUE SHIELD | Admitting: Neurology

## 2014-07-20 ENCOUNTER — Telehealth: Payer: Self-pay | Admitting: *Deleted

## 2014-07-20 DIAGNOSIS — R569 Unspecified convulsions: Secondary | ICD-10-CM

## 2014-07-20 NOTE — Telephone Encounter (Signed)
Talked with patient and she is scheduled for an EEG today at 330pm. Told patient to get here 15 minutes before. Patient verbalized understanding.

## 2014-07-21 NOTE — Procedures (Signed)
    History:  Ann Castillo is a 54 year old patient with onset of tremors and speech alteration that was associated with stress, the first episode occurring on 05/31/2014. The patient is being evaluated for these episodes.  This is a routine EEG. No skull defects are noted. Medications include alprazolam, clonazepam, ibuprofen, Prinivil, magnesium supplementation, Mobic, Prilosec and Florastor.   EEG classification: Normal awake  Description of the recording: The background rhythms of this recording consists of a fairly well modulated medium amplitude alpha rhythm of 10 Hz that is reactive to eye opening and closure. As the record progresses, the patient appears to remain in the waking state throughout the recording. Photic stimulation was performed, resulting in a bilateral and symmetric photic driving response. Hyperventilation was also performed, resulting in a minimal buildup of the background rhythm activities without significant slowing seen. At no time during the recording does there appear to be evidence of spike or spike wave discharges or evidence of focal slowing. EKG monitor shows no evidence of cardiac rhythm abnormalities with a heart rate of 72.  Impression: This is a normal EEG recording in the waking state. No evidence of ictal or interictal discharges are seen.

## 2014-07-22 ENCOUNTER — Telehealth: Payer: Self-pay | Admitting: *Deleted

## 2014-07-22 NOTE — Telephone Encounter (Signed)
I called Dr. Alycia Rossettineil's office and relayed the normal EEG results per patient request.

## 2014-07-22 NOTE — Telephone Encounter (Signed)
Called patient to let her know all her information about the EEG was forwarded to Dr. SwazilandJordan, her PCP and Dr. Alycia Rossettineil's office. Patient verbalized understanding.

## 2014-07-22 NOTE — Telephone Encounter (Signed)
Talked with patient about normal EEG results. Patient verbalized understanding. I told her we would also call Dr. Coral Elseneil from the referring psychiatric facility. Patient requested we also send the report to her PCP, Dr. SwazilandJordan. I told her I would let Dr. Lucia GaskinsAhern know and we would call her back if we needed more information.

## 2014-07-22 NOTE — Telephone Encounter (Signed)
Forwarded to SwazilandJordan, plesae let her know, thanks

## 2014-08-24 ENCOUNTER — Encounter: Payer: Self-pay | Admitting: Internal Medicine

## 2014-10-18 ENCOUNTER — Other Ambulatory Visit (INDEPENDENT_AMBULATORY_CARE_PROVIDER_SITE_OTHER): Payer: BLUE CROSS/BLUE SHIELD

## 2014-10-18 ENCOUNTER — Ambulatory Visit (INDEPENDENT_AMBULATORY_CARE_PROVIDER_SITE_OTHER): Payer: BLUE CROSS/BLUE SHIELD | Admitting: Internal Medicine

## 2014-10-18 ENCOUNTER — Encounter: Payer: Self-pay | Admitting: Internal Medicine

## 2014-10-18 VITALS — BP 118/76 | HR 76 | Ht 65.0 in | Wt 260.0 lb

## 2014-10-18 DIAGNOSIS — E7220 Disorder of urea cycle metabolism, unspecified: Secondary | ICD-10-CM | POA: Diagnosis not present

## 2014-10-18 DIAGNOSIS — R7989 Other specified abnormal findings of blood chemistry: Secondary | ICD-10-CM

## 2014-10-18 DIAGNOSIS — R945 Abnormal results of liver function studies: Principal | ICD-10-CM

## 2014-10-18 LAB — HEPATIC FUNCTION PANEL
ALK PHOS: 50 U/L (ref 39–117)
ALT: 30 U/L (ref 0–35)
AST: 20 U/L (ref 0–37)
Albumin: 4.2 g/dL (ref 3.5–5.2)
BILIRUBIN DIRECT: 0 mg/dL (ref 0.0–0.3)
Total Bilirubin: 0.5 mg/dL (ref 0.2–1.2)
Total Protein: 7.3 g/dL (ref 6.0–8.3)

## 2014-10-18 LAB — HEPATITIS B SURFACE ANTIGEN: HEP B S AG: NEGATIVE

## 2014-10-18 LAB — IBC PANEL
IRON: 73 ug/dL (ref 42–145)
SATURATION RATIOS: 20.8 % (ref 20.0–50.0)
Transferrin: 251 mg/dL (ref 212.0–360.0)

## 2014-10-18 LAB — AMMONIA: Ammonia: 24 umol/L (ref 11–35)

## 2014-10-18 LAB — FERRITIN: FERRITIN: 29.7 ng/mL (ref 10.0–291.0)

## 2014-10-18 LAB — HEPATITIS B SURFACE ANTIBODY,QUALITATIVE: Hep B S Ab: NEGATIVE

## 2014-10-18 LAB — HEPATITIS C ANTIBODY: HCV Ab: NEGATIVE

## 2014-10-18 LAB — PROTIME-INR
INR: 0.9 ratio (ref 0.8–1.0)
Prothrombin Time: 10.3 s (ref 9.6–13.1)

## 2014-10-18 NOTE — Patient Instructions (Signed)
Your physician has requested that you go to the basement for lab work before leaving today.  You have been scheduled for an abdominal ultrasound [facility] (1st floor of hospital) on 10/27/14 at 830 am. Please arrive 15 minutes prior to your appointment for registration. Make certain not to have anything to eat or drink 6 hours prior to your appointment. Should you need to reschedule your appointment, please contact radiology at [phone]. This test typically takes about 30 minutes to perform.

## 2014-10-18 NOTE — Progress Notes (Signed)
HISTORY OF PRESENT ILLNESS:  Ann Castillo is a 54 y.o. female who is referred today by Dr. Sara Swaziland regarding a constellation of neurologic symptoms, elevated venous ammonia, and elevated liver tests. Patient is morbidly obese with medical problems as listed below. She was seen on one occasion in January 2015 for screening colonoscopy. Examination was normal. Her current history is that of difficulties with speech, tremors, and left leg weakness that began in December 2015. She has had multiple evaluations with her PCP, neurology, and psychiatry. No definitive diagnosis provided, though functional etiology has been suggested. Patient has had multiple laboratories drawn. She did have an elevated venous ammonia. Also noted to have elevation of SGPT. Other liver tests were normal. Normal albumin, protein, and globulins. Hemoglobin 16.2 with MCV 91. Normal platelets at 374,000. Venous ammonia 197 (upper limit of normal 87). Patient states she has fatty liver. No personal or family history of liver disease. She does complain of acid reflux, bloating, and irritable bowel syndrome.  REVIEW OF SYSTEMS:  All non-GI ROS negative except for anxiety, arthritis, visual change, confusion, cough, shortness of breath, sore throat, sleeping problems, night sweats, muscle cramps, itching, headaches  Past Medical History  Diagnosis Date  . Hx pulmonary embolism 2006    was on estrogen   . Depression   . Other and unspecified hyperlipidemia   . Essential hypertension, benign   . Cavus deformity of foot, acquired   . Major depressive disorder, single episode, unspecified   . Dysthymic disorder   . Unspecified gastritis and gastroduodenitis without mention of hemorrhage   . Esophageal reflux   . Insomnia, unspecified   . Edema     bilateral  . Migraine, unspecified, without mention of intractable migraine without mention of status migrainosus   . Obesity, unspecified   . Polycystic ovaries   . Anemia,  unspecified   . Myalgia and myositis, unspecified   . Anxiety     Past Surgical History  Procedure Laterality Date  . No surgical history      Social History [name]  reports that she has never smoked. She has never used smokeless tobacco. She reports that she drinks alcohol. She reports that she does not use illicit drugs.  family history includes ADD / ADHD in her daughter and daughter; Alzheimer's disease in her maternal uncle, mother, and paternal grandfather; Breast cancer in her maternal grandmother; Diabetes in her father; Gallbladder disease in her father; Heart attack in her father; Heart disease in her father; Hypertension in her son; Learning disabilities in her daughter; Liver cancer in her father; Miscarriages / Stillbirths in her mother; Pancreatic cancer in her father; Personality disorder in her daughter; Tourette syndrome in her son. There is no history of Colon cancer.  Allergies  Allergen Reactions  . Celecoxib     REACTION: ? anaphylactic reaction 10/11 - throat closing, hand itching 1 1/2 hrs after dose but had taken this previously without difficulty.  . Codeine Phosphate     REACTION: questionable: mental status changes  . Dilaudid [Hydromorphone Hcl]     Pain  Increases.  blindness  . Estrogen   . Latex   . Pravastatin Sodium     REACTION: upper extremity muscle aches - ? anaphylactic reaction 3 days later 03/07/08  . Rosuvastatin     REACTION: myalgia       PHYSICAL EXAMINATION: Vital signs: BP 118/76 mmHg  Pulse 76  Ht  (1.651 m)  Wt 260 lb (117.935 kg)  BMI 43.27 kg/m2  Constitutional: Obese, generally well-appearing, no acute distress Psychiatric: alert and oriented x3, cooperative Eyes: extraocular movements intact, anicteric, conjunctiva pink Mouth: oral pharynx moist, no lesions Neck: supple no lymphadenopathy Cardiovascular: heart regular rate and rhythm, no murmur Lungs: clear to auscultation bilaterally Abdomen: soft,  obese, nontender, nondistended, no obvious ascites, no peritoneal signs, normal bowel sounds, no organomegaly Rectal: Omitted Extremities: no lower extremity edema bilaterally Skin: no lesions on visible extremities Neuro: No focal deficits. No asterixis.    ASSESSMENT:  #1. Mild elevation of SGPT. Suspect fatty liver. Rule out infectious or metabolic causes #2. No evidence for hepatic cirrhosis or decompensated liver disease #3. Elevation of venous ammonia. This test has poor sensitivity and poor specificity in this setting. Unrelated to her neurologic-type complaints. Clearly not hepatic encephalopathy #4. Morbid obesity #5. Normal screening colonoscopy January 2015    PLAN:  #1. Liver ultrasound #2. Expanded workup of elevated transaminase. Iron studies, PT/INR, chronic hepatitis serologies, repeat LFTs. Previously, ANA, ceruloplasmin, celiac testing all okay #3. Exercise and weight loss #4. Routine office follow-up in 4 weeks to review the above workup  A copy of this consultation note has been sent to Dr. Sara SwazilandJordan

## 2014-10-27 ENCOUNTER — Ambulatory Visit (HOSPITAL_COMMUNITY): Payer: BLUE CROSS/BLUE SHIELD

## 2014-11-19 ENCOUNTER — Ambulatory Visit: Payer: BC Managed Care – PPO | Admitting: Internal Medicine

## 2015-02-17 IMAGING — US US PELVIS COMPLETE
1 series · 14 of 25 positions shown · non-contrast
Comparison: No similar recent comparison exam available.

CLINICAL DATA: Pelvic pain



[Series 1: us pelvis complete · 0.39mm/px · 14 of 42 slices shown]
[im 1/42]
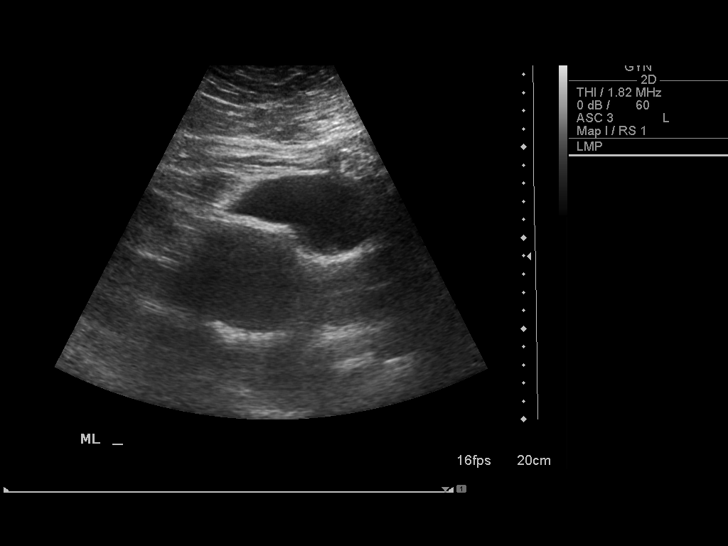
[im 4/42]
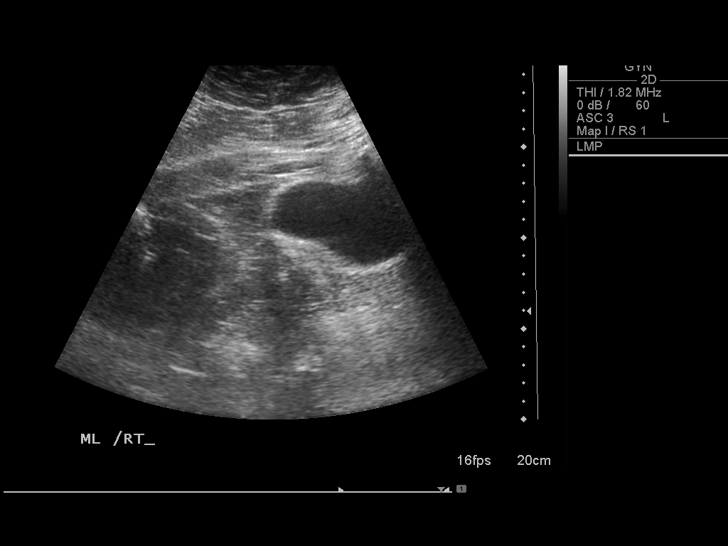
[im 7/42]
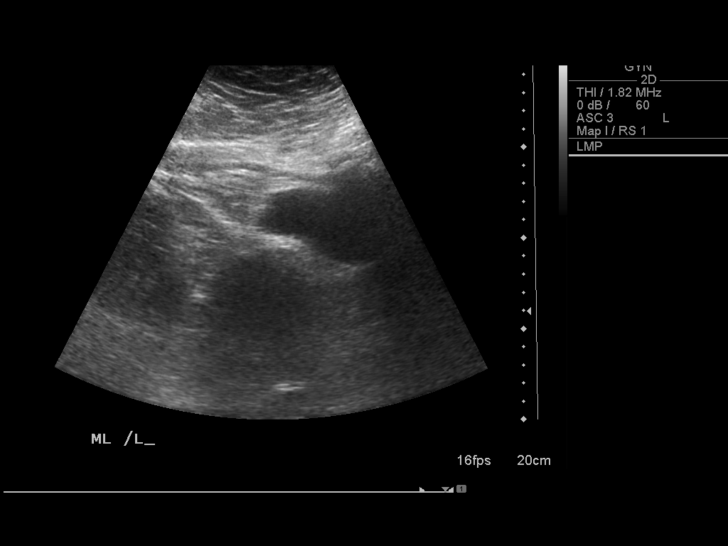
[im 11/42]
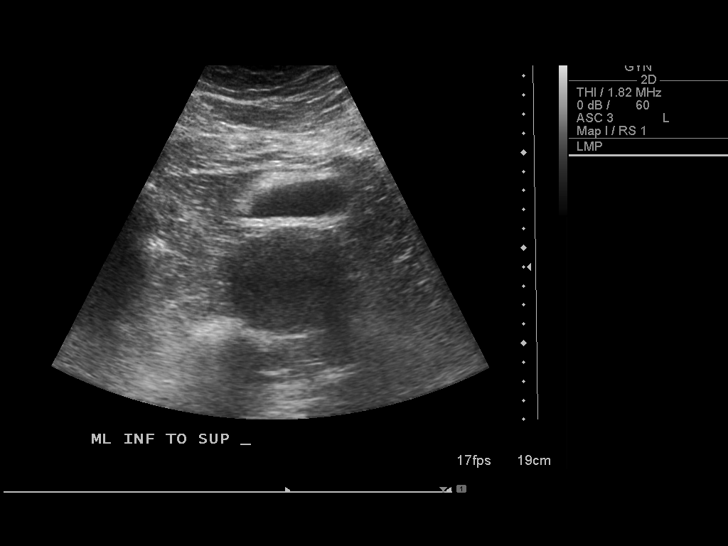
[im 14/42]
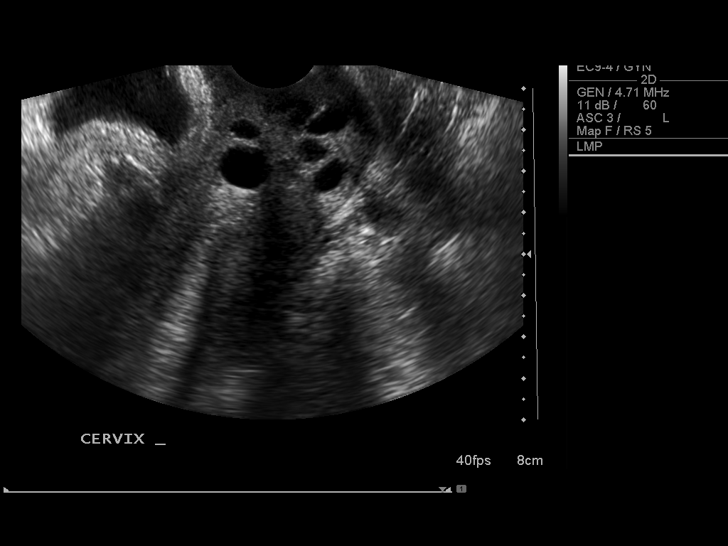
[im 16/42]
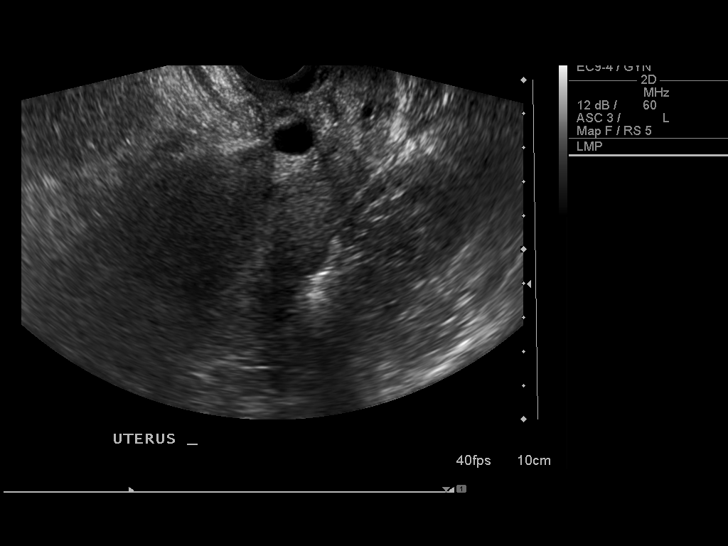
[im 19/42]
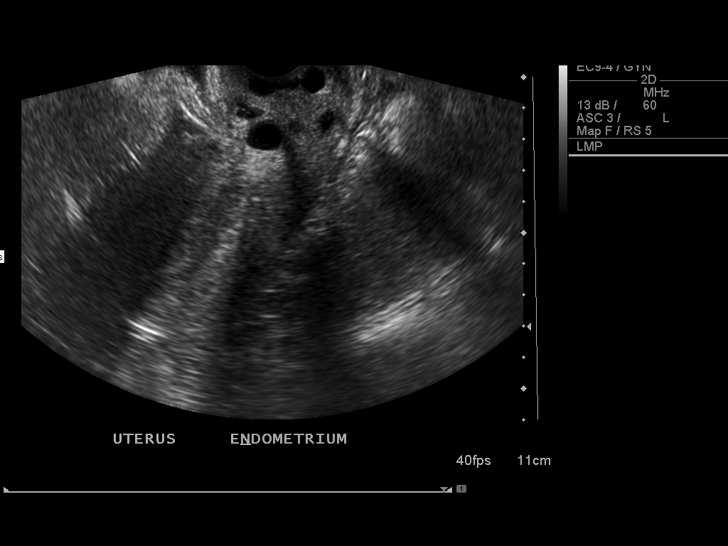
[im 23/42]
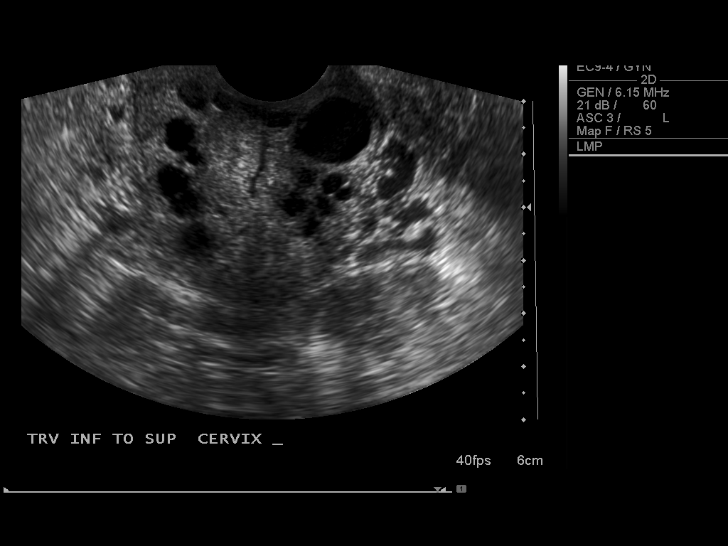
[im 26/42]
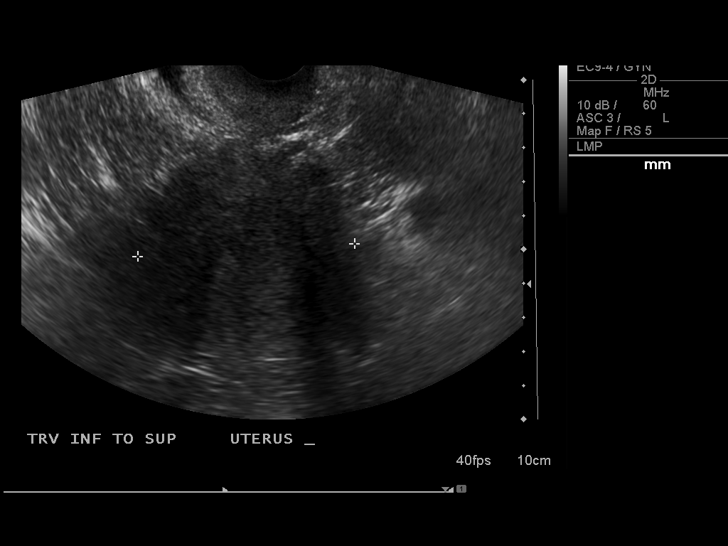
[im 28/42]
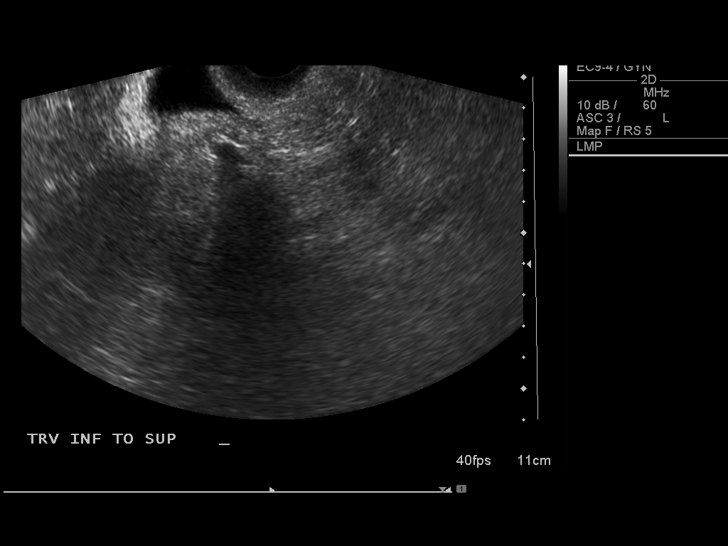
[im 31/42]
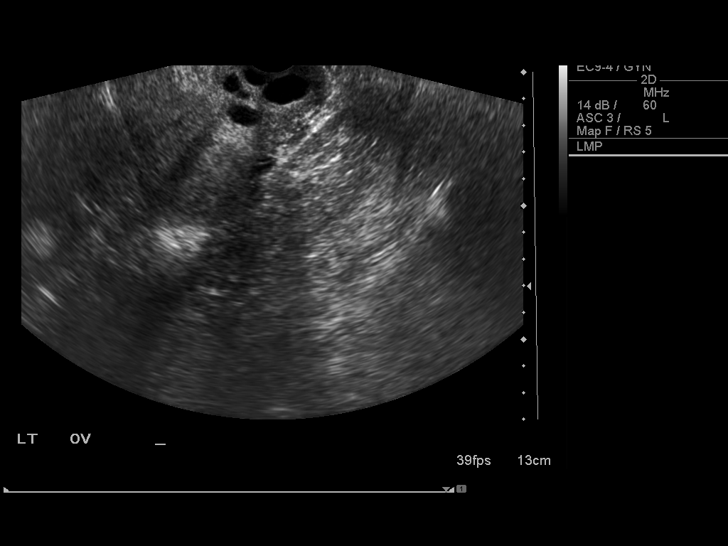
[im 35/42]
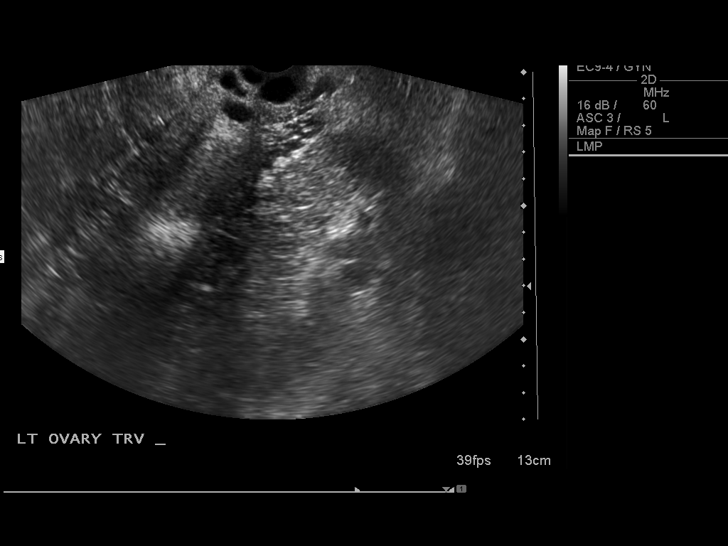
[im 38/42]
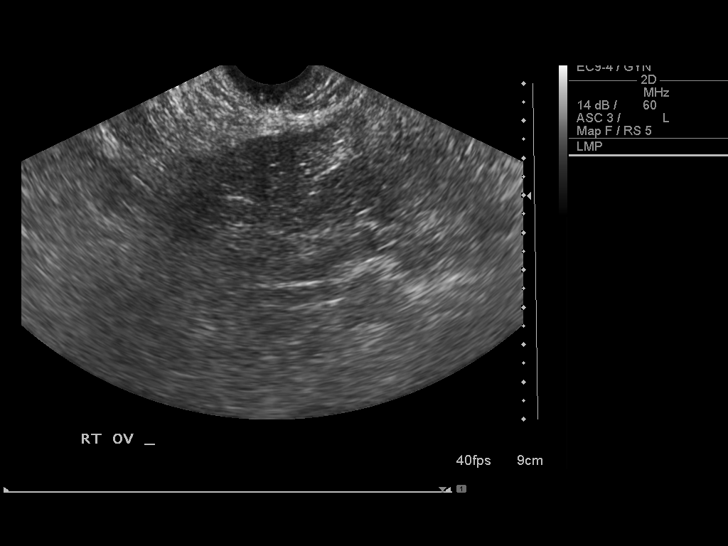
[im 42/42]
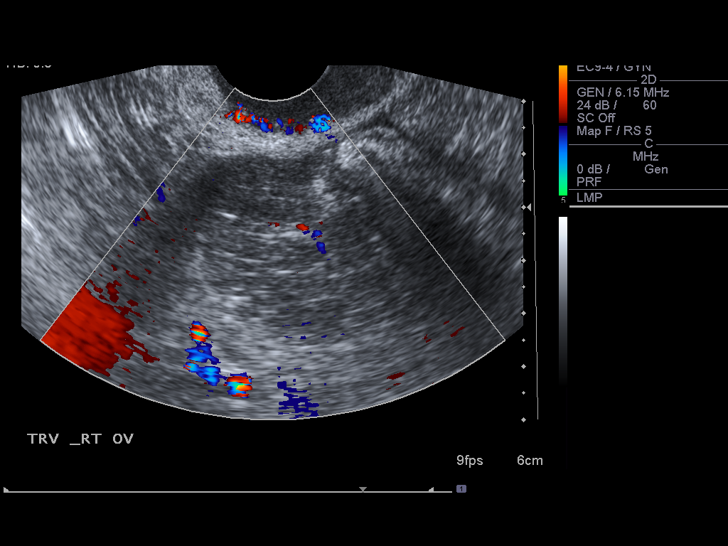

[14 of 25 positions shown; findings below may reference images not displayed]

FINDINGS: Uterus:  10.4 x 6.4 x 4.4 cm.  Anteverted, anteflexed.  No focal
abnormality.

Endometrium: 1 cm.  Uniformly echogenic without focal abnormality.

Right ovary: 2.8 x 2.2 x 2.1 cm.  Not well visualized.  No focal
abnormality.

Left ovary: 3.6 x 2.7 x 2.1 cm.  Not well visualized.  No focal
abnormality.

Other Findings:  No free fluid
IMPRESSION: Normal study.  No evidence of pelvic mass or other significant
abnormality.

## 2015-03-02 ENCOUNTER — Encounter: Payer: Self-pay | Admitting: Obstetrics & Gynecology

## 2015-03-24 NOTE — Telephone Encounter (Signed)
Error

## 2015-07-25 DIAGNOSIS — Z0271 Encounter for disability determination: Secondary | ICD-10-CM

## 2016-02-22 ENCOUNTER — Ambulatory Visit (INDEPENDENT_AMBULATORY_CARE_PROVIDER_SITE_OTHER): Payer: Managed Care, Other (non HMO) | Admitting: Neurology

## 2016-02-22 VITALS — BP 130/82 | HR 74 | Ht 65.0 in | Wt 273.4 lb

## 2016-02-22 DIAGNOSIS — F444 Conversion disorder with motor symptom or deficit: Secondary | ICD-10-CM

## 2016-02-22 NOTE — Progress Notes (Signed)
[facility]    Provider:  Dr Lucia GaskinsAhern Referring Provider: SwazilandJordan, Sarah T, MD Primary Care Physician:  Maudie FlakesANDERSON, SHANE D, FNP   CC:  Changes in speech pattern   Interval history 02/22/2016:  She is taking a tremendous amount of medication supplements. Discussed I do not know how these interact and what their side effects are. She claims her symptoms are Lyme disease. She has psychiatric history and is seeing a psychiatrist and therapist.  She has days where she has normal speech and days where her speech is impaired based on stress per patient. She is very anxious today. I suggested she be [facility] to see if there is a neurologic cause for her symptoms . I highly encouraged her to go see a specialty clinic for a second opinion, I can't find anything to point to a neurologic cause but a second opinion would be wise to ensure there is no neurologic cause that we cannot find. Patient vehemently declines, I highly encouraged and I offered to refer her but she declined. She is having headaches but they have improved, she does not drink enough and she gets overheated. She is crying in the office. Feels like her ear feels full.   HPI:  Ann Castillo is a 55 y.o. female here as a referral from Dr. SwazilandJordan for changes in speech pattern associated with increased stress (see addendum below, patient has developed new episodes of LOC with confusion since this appointment). PMHx depression, anxiety.   On the 4th of January she was at the gym, she walking on the treadmill, she didn't feel right, she broke out into a sweat. Her hands were shaking and her speech changed, acute onset including tremor. She went to the bathroom a lot. She went to the ED. They sat in the waiting room for a long time, they forgot her out there, then she waited for hours again, around midnight speech was returning to normal. She woke up the next day and everything was fine. Then had  another acute onset of tremor, speech changes, not feeling right, redness in the face. She went to her pcp. She became more agitated and her "speech went south and the tremors started getting bad", she was sent bad to the hospital and it was a bad experience again. The acute episodes are happening 1-2x a day and then resolve. Now it is getting to be more persistent. She wakes up panicking. Symptoms improves after a nap. She has been under a lot of stress. Husband seems to think this is psychiatric, she has a fairly stressful situation at work, demands are high, trying to balance work and life, her energy levels are down, symptoms are waxing and waning with her energy and stress levels. She reports headache and neck stiffness.   Addendum 07/19/2014: Spoke to Patient's psychiatric office. Per DNP Vear ClockLeslie O'Neil, [phone], at [facility], MD office, patient is reporting new-onset episodes of LOC with prolonged confusion afterwards. They would like an EEG for patient. Will order.   Reviewed notes, labs and imaging from outside physicians, which showed: She was seen in the ED on 1/4 and 1/6. On 1/4 note in the ED state symptoms got better with OJ, no focal neurologic deficits were noted, no HA,labs, CT of the head and vitals unremarkable, she was sent home. On 1/6 she was again in the ED and  Neurology was consulted. Per Neurology notes, the speech patterns seemed to coincide with stress in  the ED and the speech patterns waxed and waned in the ED.Neurology recommended follow up outpatient and MRI of the brain.   MRI of the brain was normal, showed no acute intracranial abnormalities including mass lesion or mass effect, hydrocephalus, extra-axial fluid collection, midline shift, hemorrhage, or acute infarction, large ischemic events (personally reviewed images)  Review of Systems: Patient complains of symptoms per HPI as well as the following symptoms: fatigue, feeling hot, CP, flushing, joint pain,  aching muscles, confusion, headache, weakness, dizziness, s;leepiness, rls, anxiety, racing thoughts. Pertinent negatives per HPI. All others negative.   Social History   Social History  . Marital status: Married    Spouse name: N/A  . Number of children: 3  . Years of education: College   Occupational History  . [employer]   Social History Main Topics  . Smoking status: Never Smoker  . Smokeless tobacco: Never Used  . Alcohol use 0.0 oz/week    1 Glasses of wine per week     Comment: occasional glass of wine, 1/2 beer per month, martini every 3 weeks  . Drug use: No  . Sexual activity: No   Other Topics Concern  . Not on file   Social History Narrative   Married, works as a IT consultant.  She has significant stress from daughter, who is mentally ill and lives at home (age 73).     Caffeine: very rarely     Family History  Problem Relation Age of Onset  . Diabetes Father   . Heart disease Father   . Liver cancer Father   . Pancreatic cancer Father   . Gallbladder disease Father   . Heart attack Father     x5, first @ 40  . Learning disabilities Daughter     8 y.o mental capacity  . Miscarriages / [place] Mother   . Alzheimer's disease Mother   . ADD / ADHD Daughter   . Colon cancer Neg Hx   . Breast cancer Maternal Grandmother   . Alzheimer's disease Paternal Grandfather   . Alzheimer's disease Maternal Uncle   . Hypertension Son   . Tourette syndrome Son   . Personality disorder Daughter   . ADD / ADHD Daughter     Past Medical History:  Diagnosis Date  . Anemia, unspecified   . Anxiety   . Cavus deformity of foot, acquired   . Depression   . Dysthymic disorder   . Edema    bilateral  . Esophageal reflux   . Essential hypertension, benign   . Hx pulmonary embolism 2006   was on estrogen   . Insomnia, unspecified   . Major depressive disorder, single episode, unspecified   . Migraine, unspecified, without mention of  intractable migraine without mention of status migrainosus   . Myalgia and myositis, unspecified   . Obesity, unspecified   . Other and unspecified hyperlipidemia   . Polycystic ovaries   . Unspecified gastritis and gastroduodenitis without mention of hemorrhage     Past Surgical History:  Procedure Laterality Date  . no surgical history      Current Outpatient Prescriptions  Medication Sig Dispense Refill  . AMBULATORY NON FORMULARY MEDICATION Medication Name: Kidney Factors take one tablet by mouth three times a day with meals    . Barberry-Oreg Grape-Goldenseal (BERBERINE COMPLEX PO) Take 450 mg by mouth 2 (two) times daily.    . Cholecalciferol (VITAMIN D3 PO) Take 5,000 Units by mouth 2 (two) times daily.    Marland Kitchen  Chromium Picolinate 500 MCG CAPS Take 1 capsule by mouth daily.    Marland Kitchen escitalopram (LEXAPRO) 20 MG tablet Take 20 mg by mouth daily.    Marland Kitchen lisinopril-hydrochlorothiazide (PRINZIDE,ZESTORETIC) 10-12.5 MG per tablet     . magnesium gluconate (MAGONATE) 500 MG tablet Take 500 mg by mouth daily.    . meloxicam (MOBIC) 15 MG tablet Take 15 mg by mouth daily as needed for pain.     . Milk Thistle 150 MG CAPS Take 150 mg by mouth 2 (two) times daily.    Marland Kitchen OVER THE COUNTER MEDICATION Take 1 Dose by mouth 2 (two) times daily. [facility]    . OVER THE COUNTER MEDICATION Take 250 mg by mouth 2 (two) times daily. Olive leaf    . OVER THE COUNTER MEDICATION Take 90 mcg by mouth 2 (two) times daily. Mk-7    . OVER THE COUNTER MEDICATION Take 200 mg by mouth daily. Selenium    . OVER THE COUNTER MEDICATION Take 500 mg by mouth 2 (two) times daily. N-A-C    . OVER THE COUNTER MEDICATION Take 300 mg by mouth 2 (two) times daily. Tumeric    . UNABLE TO FIND 10 mg as needed. Med Name: CBD oil     No current facility-administered medications for this visit.     Allergies as of 02/22/2016 - Review Complete 02/22/2016  Allergen Reaction Noted  . Celecoxib  03/07/2008  . Codeine phosphate   11/05/2005  . Dilaudid [hydromorphone hcl]  10/13/2012  . Estrogen  08/12/2008  . Latex  10/13/2012  . Pravastatin sodium  03/07/2008  . Rosuvastatin  12/09/2008    Vitals: BP 130/82 (BP Location: Left Arm, Patient Position: Sitting, Cuff Size: Normal)   Pulse 74   Ht 5\' 5"  (1.651 m)   Wt 273 lb 6.4 oz (124 kg)   BMI 45.50 kg/m  Last Weight:  Wt Readings from Last 1 Encounters:  02/22/16 273 lb 6.4 oz (124 kg)   Last Height:   Ht Readings from Last 1 Encounters:  02/22/16 5\' 5"  (1.651 m)    Physical exam: Exam: Gen: NAD, conversant, well nourised, obese, well groomed                     CV: RRR, no MRG. No Carotid Bruits. No peripheral edema, warm, nontender Eyes: Conjunctivae clear without exudates or hemorrhage  Neuro: Detailed Neurologic Exam  Speech:    Ataxic dysarthria type speech with normal comprehension. Speech returns to normal briefly when distracted. Cognition:    The patient is oriented to person, place, and time;     recent and remote memory intact;     language fluent;     normal attention, concentration,     fund of knowledge Cranial Nerves:    The pupils are equal, round, and reactive to light. The fundi are normal and spontaneous venous pulsations are present. Visual fields are full to finger confrontation. Extraocular movements are intact. Trigeminal sensation is intact and the muscles of mastication are normal. The face is symmetric. The palate elevates in the midline. Hearing intact. Voice is normal. Shoulder shrug is normal. The tongue has normal motion without fasciculations.   Coordination:    Normal finger to nose and heel to shin. Normal rapid alternating movements.   Gait:    Heel-toe and tandem gait are normal.   Motor Observation: Intention, postural and resting tremor that stops with distraction Tone:    Normal muscle tone.  Posture:    Posture is normal. normal erect    Strength:    Strength is V/V in the upper and  lower limbs.      Sensation: intact to LT     Reflex Exam:  DTR's:    Deep tendon reflexes in the upper and lower extremities are normal bilaterally.   Toes:    The toes are downgoing bilaterally.   Clonus:    Clonus is absent.      Assessment/Plan:  JAQUASHA CARNEVALE is a 55 y.o. female here as a referral from Dr. Swaziland for episodic changes in speech pattern associated with increased stress. PMHx depression, anxiety. Patient has a scanning-speech quality and a tremor, both are distractable. Otherwise neuro exam non focal. Speech was normal when I walked in but became more symptomatic as she became more anxious.   May be functional. However does sound slightly scanning associated with increased tremor so worked up for causes of cerebellar ataxia including labwork (she refused LP). However cerebellum was normal on MRI of the brain. Lab work up was negative including: ANA, Gad-65, paraneoplastic profile, tsh, b12, ace, crp, cmp, copper and ceruloplasmin, b1, gliadin, hiv, rpr, heavy metals,   Ammonia was elevated 197 but later normal at 24 and workup negative including hepatitis labs with pcp  Thyroglobulin antibodies and thyroid peroxidase antibodies were negative, crp nml,  performed by dr Shari Prows 11/2015. She also took 3 weeks of amoxicillin.   Will refer to speech therapy.   Recommended a referral to [facility] for a second opinion whether there is a neurologic condition associated with her speech.  She declines multiple times. I discussed with her that because I cannot find anything to explain her speech does not mean there is anything wrong, she may need another neurologist for second opinion and highly encouraged it and she declined multple times. I referred her to speech therapy.   Follow up with pcp.   MRI brain was normal 06/14/2014  To prevent or relieve headaches, try the following: Cool Compress. Lie down and place a cool compress on  your head.  Avoid headache triggers. If certain foods or odors seem to have triggered your migraines in the past, avoid them. A headache diary might help you identify triggers.  Include physical activity in your daily routine. Try a daily walk or other moderate aerobic exercise.  Manage stress. Find healthy ways to cope with the stressors, such as delegating tasks on your to-do list.  Practice relaxation techniques. Try deep breathing, yoga, massage and visualization.  Eat regularly. Eating regularly scheduled meals and maintaining a healthy diet might help prevent headaches. Also, drink plenty of fluids.  Follow a regular sleep schedule. Sleep deprivation might contribute to headaches Consider biofeedback. With this mind-body technique, you learn to control certain bodily functions - such as muscle tension, heart rate and blood pressure - to prevent headaches or reduce headache pain.    Proceed to emergency room if you experience new or worsening symptoms or symptoms do not resolve, if you have new neurologic symptoms or if headache is severe, or for any concerning symptom.   Cc: Sarah Swaziland and  Maudie Flakes, FNP   Naomie Dean, MD  [facility] [address] [place], [place] [address]  Phone [phone] Fax [phone]  A total of 30 minutes was spent face-to-face with this patient. 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

## 2016-02-22 NOTE — Patient Instructions (Signed)
Remember to drink plenty of fluid, eat healthy meals and do not skip any meals. Try to eat protein with a every meal and eat a healthy snack such as fruit or nuts in between meals. Try to keep a regular sleep-wake schedule and try to exercise daily, particularly in the form of walking, 20-30 minutes a day, if you can.   Our phone number is [phone]. We also have an after hours call service for urgent matters and there is a physician on-call for urgent questions. For any emergencies you know to call 911 or go to the nearest emergency room  To prevent or relieve headaches, try the following: Cool Compress. Lie down and place a cool compress on your head.  Avoid headache triggers. If certain foods or odors seem to have triggered your migraines in the past, avoid them. A headache diary might help you identify triggers.  Include physical activity in your daily routine. Try a daily walk or other moderate aerobic exercise.  Manage stress. Find healthy ways to cope with the stressors, such as delegating tasks on your to-do list.  Practice relaxation techniques. Try deep breathing, yoga, massage and visualization.  Eat regularly. Eating regularly scheduled meals and maintaining a healthy diet might help prevent headaches. Also, drink plenty of fluids.  Follow a regular sleep schedule. Sleep deprivation might contribute to headaches Consider biofeedback. With this mind-body technique, you learn to control certain bodily functions - such as muscle tension, heart rate and blood pressure - to prevent headaches or reduce headache pain.    Proceed to emergency room if you experience new or worsening symptoms or symptoms do not resolve, if you have new neurologic symptoms or if headache is severe, or for any concerning symptom.   

## 2016-10-04 IMAGING — CR DG CHEST 2V
2 series · 2 of 2 positions shown · non-contrast
Comparison: 09/19/2011

CLINICAL DATA: Hot and cold sweats after the gym tonight. Flushed
in shaking feeling. Speaking slowly and difficult to form words.
Left chest pain yesterday.

EXAM:
CHEST  2 VIEW

[w chest pa]
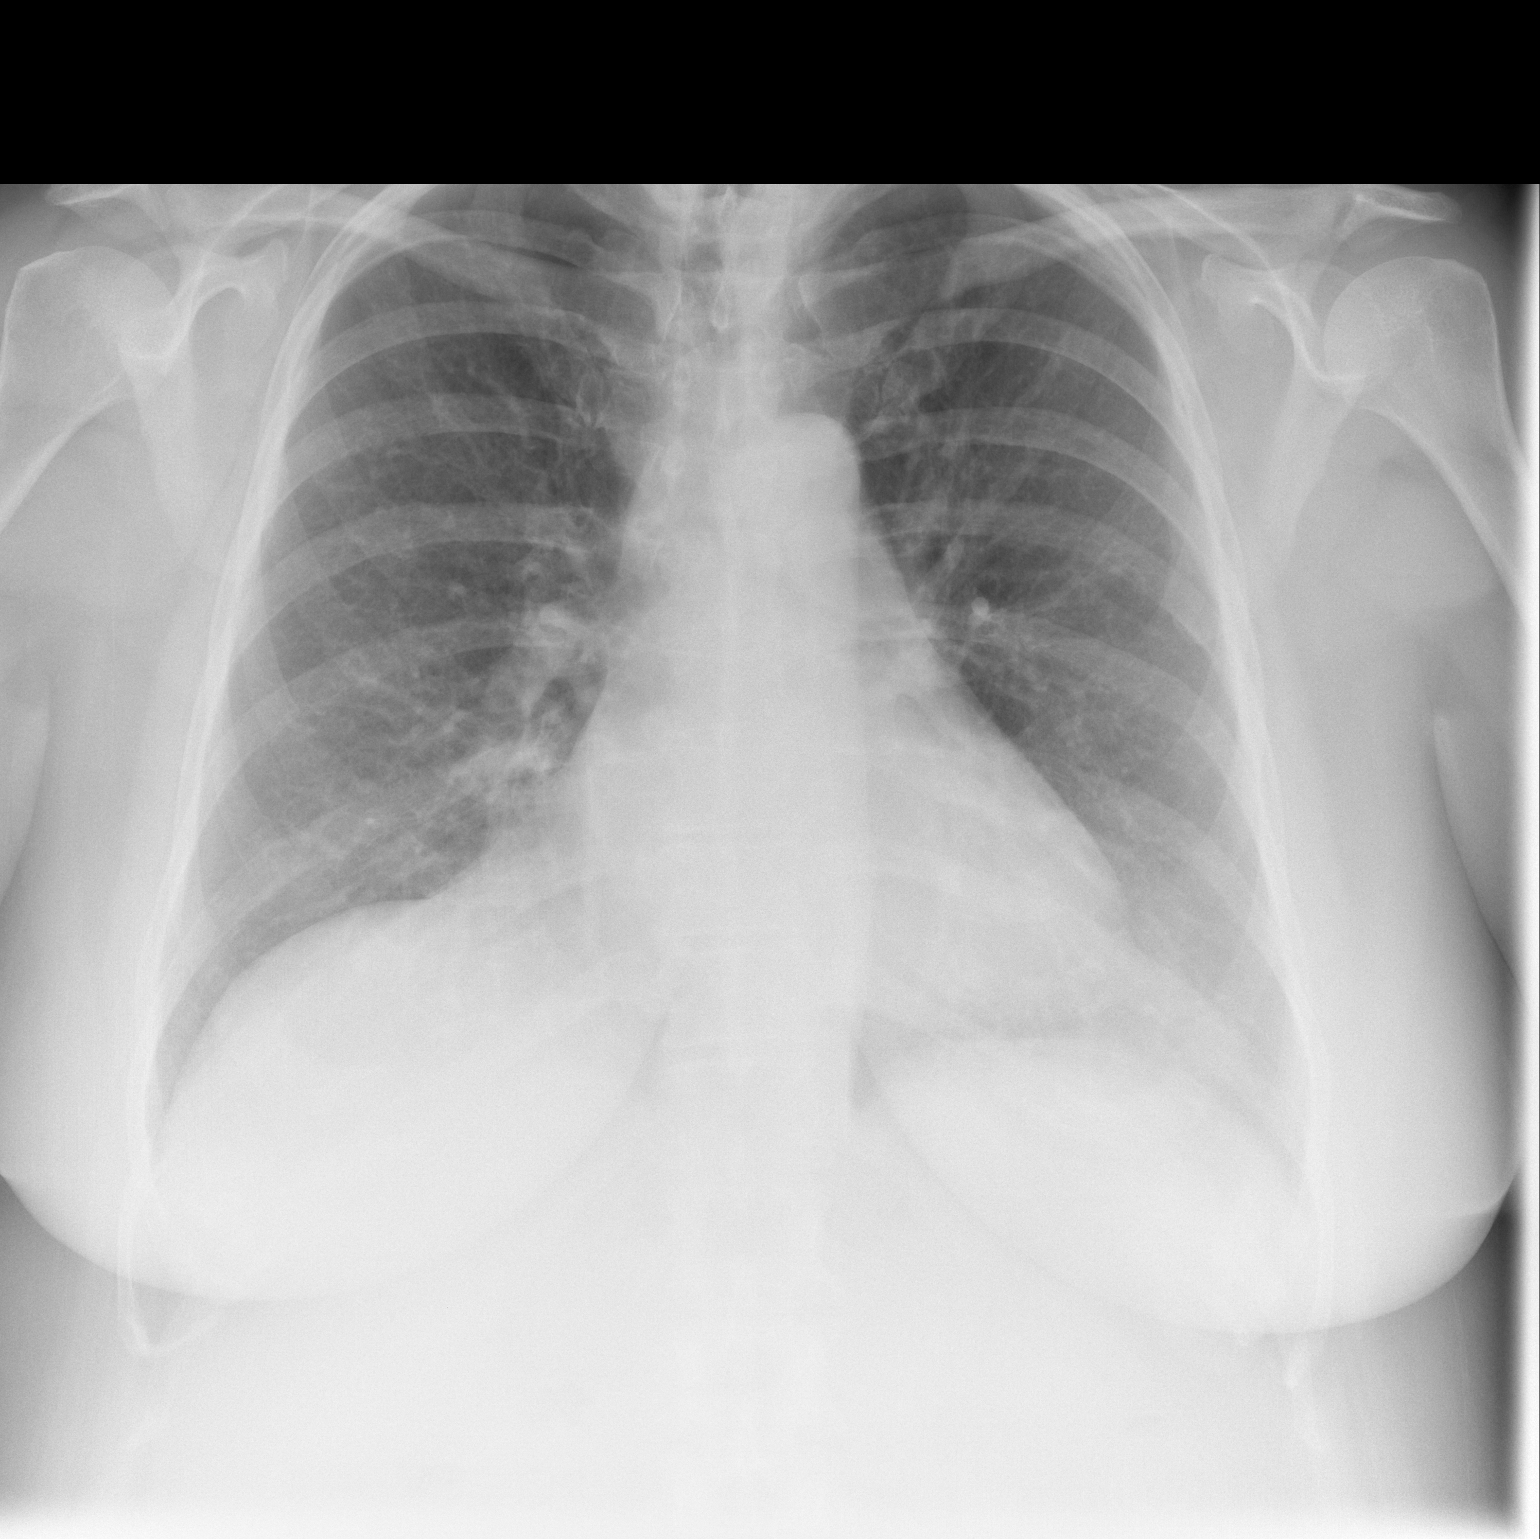

[w chest lat]
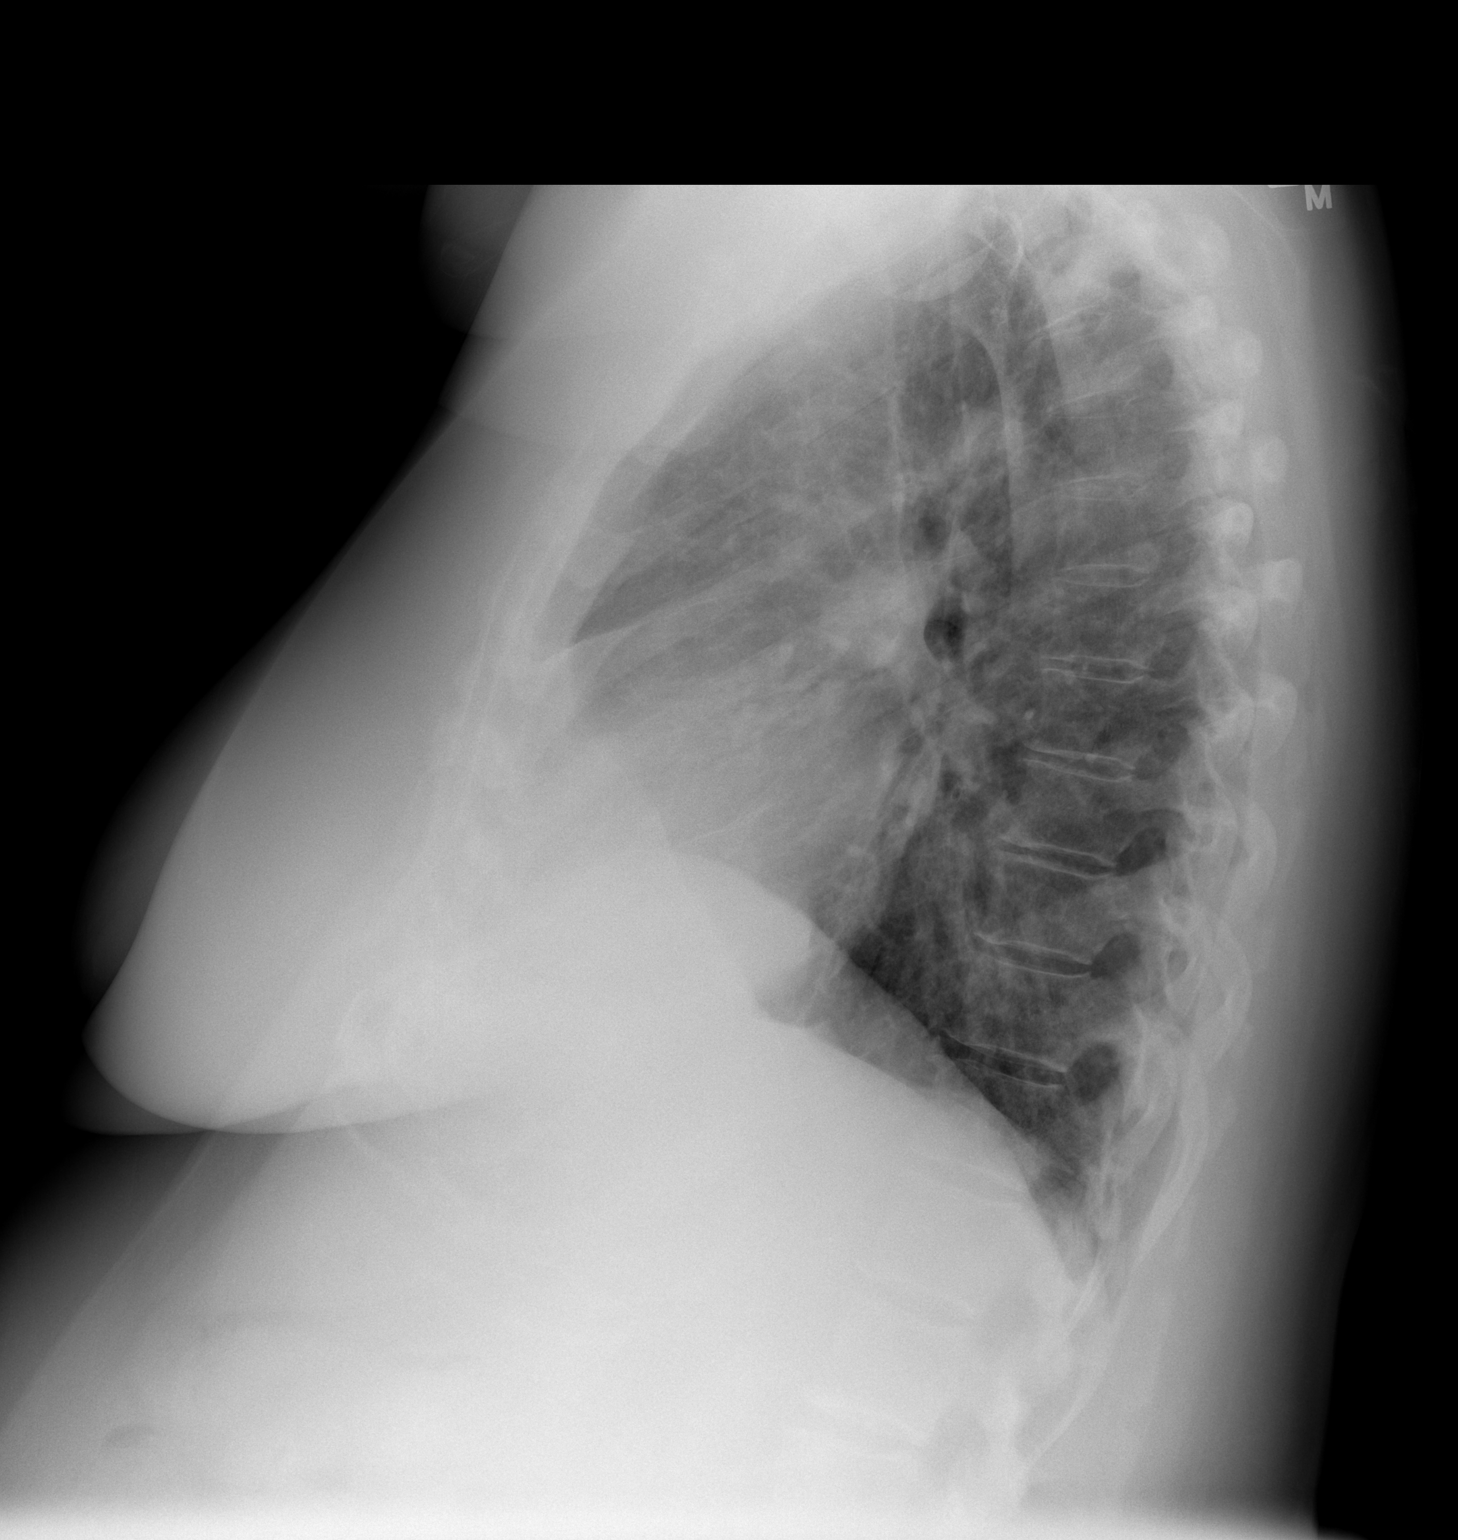

[2 of 2 positions shown; findings below may reference images not displayed]

FINDINGS: The heart size and mediastinal contours are within normal limits.
Both lungs are clear. The visualized skeletal structures are
unremarkable.
IMPRESSION: No active cardiopulmonary disease.

## 2016-10-18 IMAGING — MR MR HEAD W/O CM
9 of 10 series · 40 of 48 positions shown · non-contrast
Comparison: CT head 05/31/2014

CLINICAL DATA: Speech disturbance in adult.

EXAM:
MRI HEAD WITHOUT CONTRAST
TECHNIQUE: Multiplanar, multiecho pulse sequences of the brain and surrounding
structures were obtained without intravenous contrast.

[Series 2: T1 · sagittal · 5.0mm · 0.45mm/px · 3 of 19 slices shown]
[im 1/19]
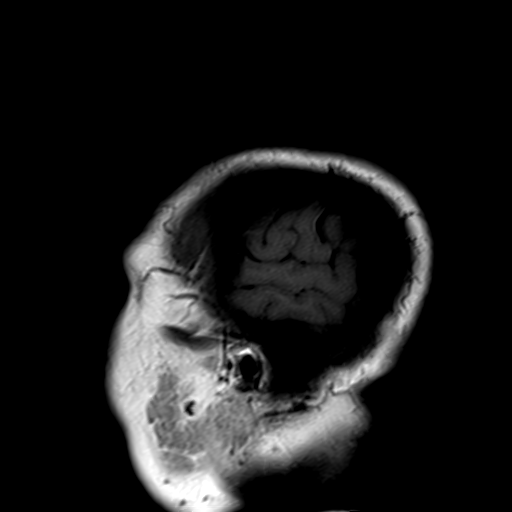
[im 10/19]
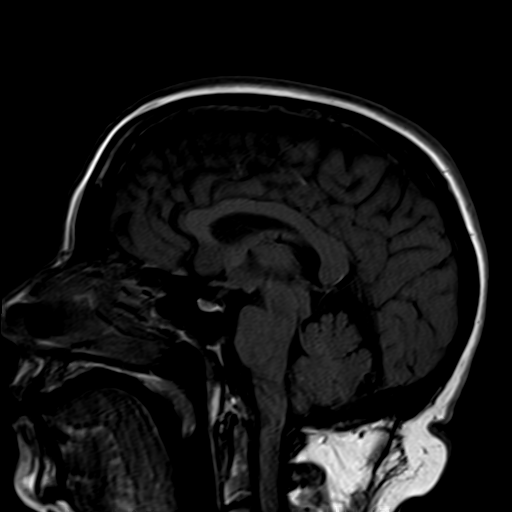
[im 19/19]
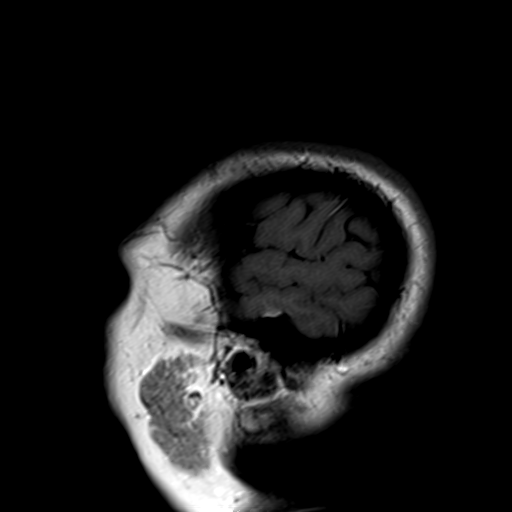

[Series 3: DWI · axial · 3.0mm · 1.80mm/px · z∈[-41,+80]mm · 9 of 80 slices shown (1 of 4)]
[im 1/80]
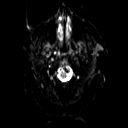
[im 10/80]
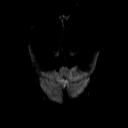
[im 20/80]
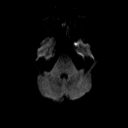
[im 30/80]
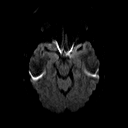
[im 40/80]
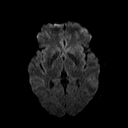
[im 50/80]
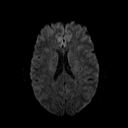
[im 60/80]
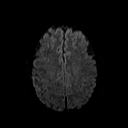
[im 70/80]
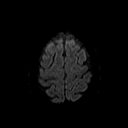
[im 80/80]
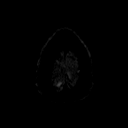

[Series 4: DWI · axial · 3.0mm · 1.80mm/px · z∈[-41,+80]mm · 4 of 39 slices shown (2 of 4)]
[im 1/39]
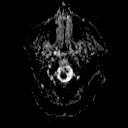
[im 13/39]
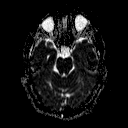
[im 26/39]
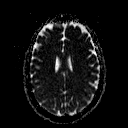
[im 39/39]
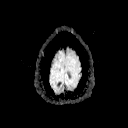

[Series 5: DWI · coronal · 5.0mm · 1.80mm/px · 7 of 64 slices shown (3 of 4)]
[im 1/64]
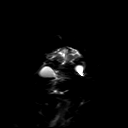
[im 11/64]
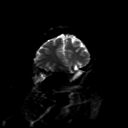
[im 22/64]
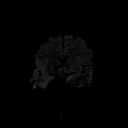
[im 32/64]
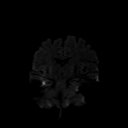
[im 43/64]
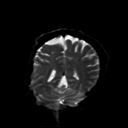
[im 53/64]
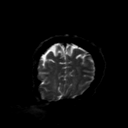
[im 64/64]
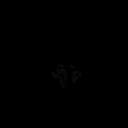

[Series 6: DWI · coronal · 5.0mm · 1.80mm/px · 3 of 32 slices shown (4 of 4)]
[im 1/32]
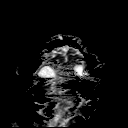
[im 16/32]
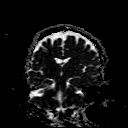
[im 32/32]
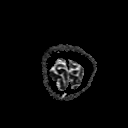

[Series 7: T2 · axial · 5.0mm · 0.30mm/px · z∈[-44,+84]mm · 2 of 22 slices shown (1 of 2)]
[im 1/22]
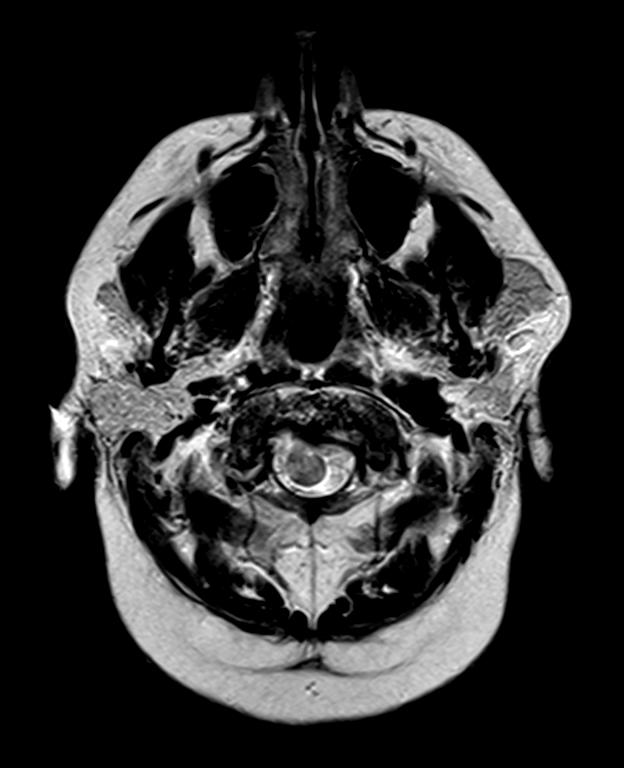
[im 22/22]
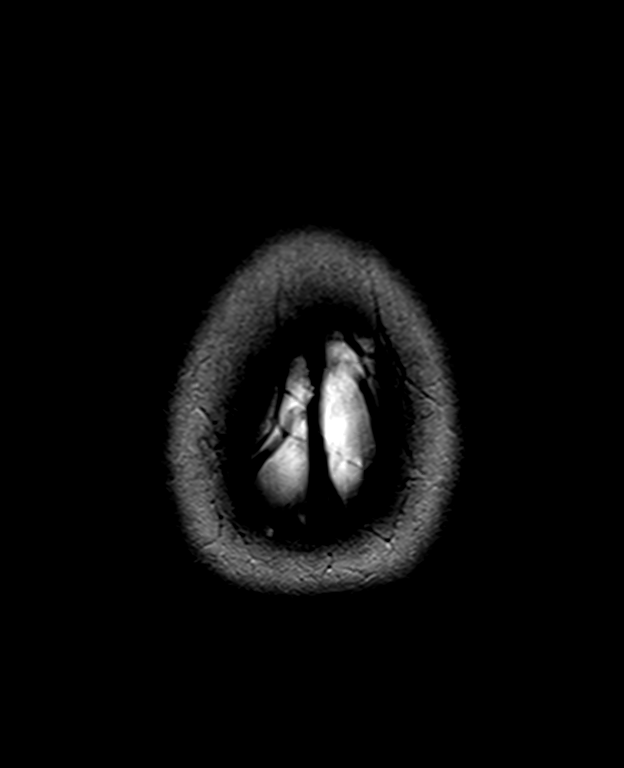

[Series 8: FLAIR · axial · 5.0mm · 0.45mm/px · z∈[-44,+84]mm · 2 of 22 slices shown]
[im 1/22]
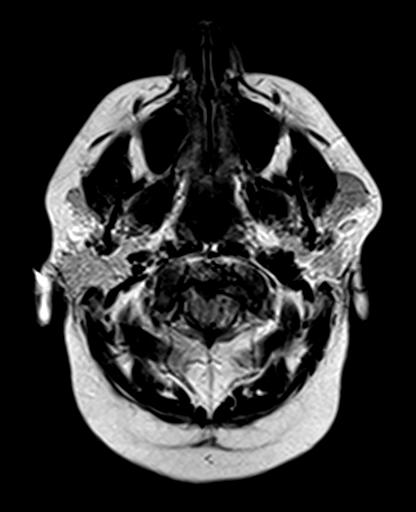
[im 22/22]
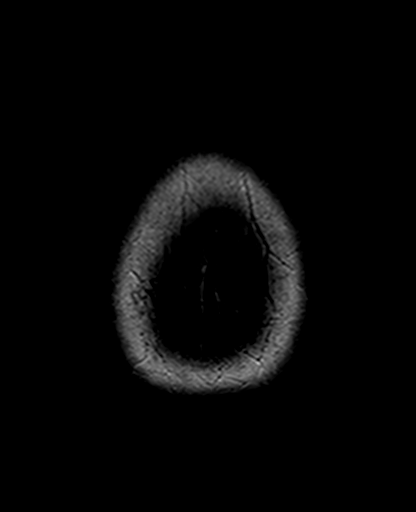

[Series 11: swi_images · axial · 2.0mm · 0.90mm/px · z∈[-46,+87]mm · 8 of 72 slices shown]
[im 1/72]
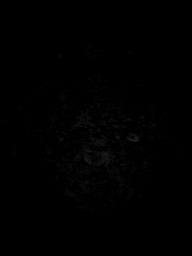
[im 11/72]
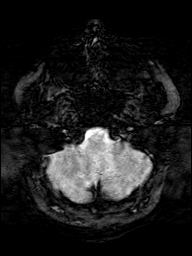
[im 21/72]
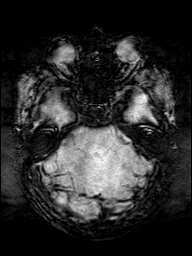
[im 31/72]
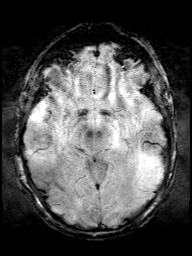
[im 41/72]
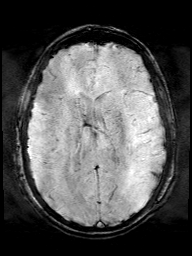
[im 51/72]
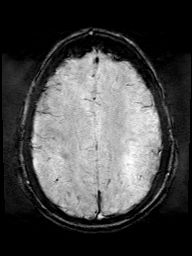
[im 61/72]
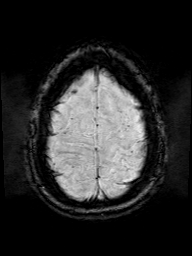
[im 72/72]
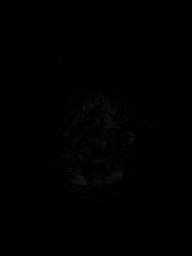

[Series 12: T2 · coronal · 5.0mm · 0.45mm/px · 2 of 22 slices shown (2 of 2)]
[im 1/22]
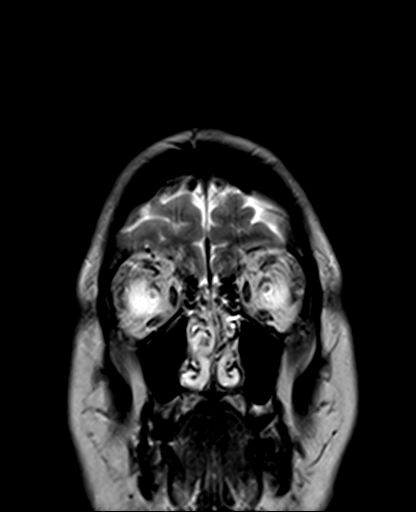
[im 22/22]
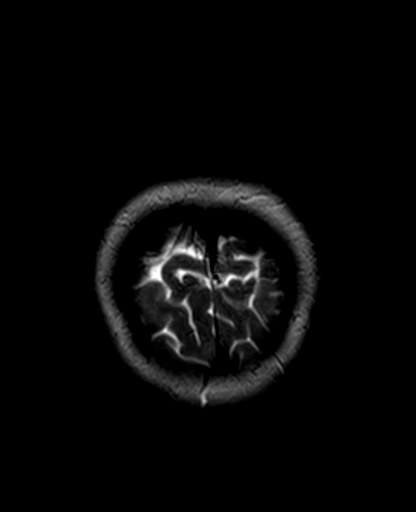

[40 of 48 positions shown; findings below may reference images not displayed]

FINDINGS: Cerebral volume is normal. Ventricle size is normal. Pituitary is
normal in size

Negative for acute or chronic infarction

Negative for demyelinating disease.

Negative for intracranial hemorrhage

Negative for mass or edema.

Paranasal sinuses clear
IMPRESSION: Normal

## 2016-11-14 ENCOUNTER — Encounter: Payer: Self-pay | Admitting: Psychology

## 2017-03-05 ENCOUNTER — Encounter: Payer: Self-pay | Admitting: Obstetrics & Gynecology

## 2017-03-07 DIAGNOSIS — M2241 Chondromalacia patellae, right knee: Secondary | ICD-10-CM | POA: Diagnosis not present

## 2017-03-07 DIAGNOSIS — M2242 Chondromalacia patellae, left knee: Secondary | ICD-10-CM | POA: Diagnosis not present

## 2017-03-08 DIAGNOSIS — F444 Conversion disorder with motor symptom or deficit: Secondary | ICD-10-CM | POA: Diagnosis not present

## 2017-03-12 ENCOUNTER — Ambulatory Visit (INDEPENDENT_AMBULATORY_CARE_PROVIDER_SITE_OTHER): Payer: Medicare Other | Admitting: Psychology

## 2017-03-12 ENCOUNTER — Encounter: Payer: Self-pay | Admitting: Psychology

## 2017-03-12 DIAGNOSIS — R413 Other amnesia: Secondary | ICD-10-CM

## 2017-03-12 DIAGNOSIS — F419 Anxiety disorder, unspecified: Secondary | ICD-10-CM

## 2017-03-12 NOTE — Progress Notes (Signed)
NEUROPSYCHOLOGICAL INTERVIEW (CPT: [id number])  Name: Ann [facility] Date of Birth: 1961-05-18 Date of Interview: 03/12/2017  Reason for Referral:  [facility] is a 56 y.o. female who is referred for neuropsychological evaluation by Dr. Lynder [facility] of [place] [facility] due to concerns about forgetfulness and concentration difficulty. This patient is accompanied in the office by her husband, [facility], who supplements the history.  History of Presenting Problem:  [facility] reports abrupt onset of speech changes, hand tremors, gait changes and other physiological/neurologic symptoms in January 2016. 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 Since then, she reports she has difficulty with reading comprehension/retention and forgetfulness. 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 She is on disability.   Of note, she states that blood tests came back "inconclusive" for Lyme, but a "DNA test showed Lymes and 5 co-infections". 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  She reports her symptoms have waxed and waned since the initial onset in 2016 but have been completely absent. Her husband reports that stress, exercise, heat, blinking lights and loud noises all seem to exacerbate symptoms so they try to avoid these as much as possible. The patient and her husband report that CBD oil has been very helpful for symptoms and they can tell a dramatic difference if she forgets to take it. She sees [facility], a naturopathic provider, in [place].   She reports there are days when she gets disoriented driving and has to return home. Her husband states she has short term memory problems, forgetting where she has put things and being less organized overall.  She also reports frequent racing thoughts and obsessiveness.   She reports that there  are days when all her muscles are restless, muscles in her "feet, arms, perineum", feeling like they need to move.   Upon direct questioning, the patient and her husband reported the following with regard to current cognitive functioning:  Forgetting recent conversations/events: Yes Repeating statements/questions: Not significant Misplacing/losing items: Yes Forgetting appointments or other obligations: Obsessively goes over lists/appointments Forgetting to take medications: Sometimes. She takes a large number of supplements and homeopathic remedies.  Difficulty concentrating: Yes Starting but not finishing tasks: Yes Processing information more slowly: Yes Word-finding difficulty: Yes Writing difficulty: Tremors have influenced Spelling difficulty: Reduced Comprehension difficulty in conversation: Some days but not as a general rule Getting lost when driving: Yes Uncertain about directions when driving or passenger: Yes  She reports a family history of Alzheimer's disease in her mother, maternal uncle and paternal grandfather.  She independently manages complex ADLs. She manages the finances and has made errors.  She denied any personal history of head injury.   She reports sleep difficulties with both onset and maintenance. She denies problems with appetite. She has not had any hallucinations.   She reports her mood is "down" and "powerless". She also endorses significant anxiety. Generally she says she is an optimistic, upbeat person but lately she feels powerless. She does see a therapist and thinks this is helpful.   She has a history of some anxiety prior to Lyme but not necessarily depression.  She is participating in [place] [facility], yoga and meditation. 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 She does stay busy during the week with things she enjoys and that are not too stressful  for her.  She denies suicidal ideation or intention.  Per records reviewed,  the patient has seen neurologists at [facility], most recently Dr. Jaynee [facility] a year ago. It was noted that her symptoms may be functional. Additional testing and second opinion was offered but the patient declined.   Social History: Born/Raised: [place], [place] Education: [employer] Occupational history: On disability Marital history: Married 39 years Children: 60yo, 25 yo, 72 yo No grandchildren Has lived here since 1998 Alcohol: 1-2 drinks a month Tobacco: Never SA: None   Medical History: Past Medical History:  Diagnosis Date  . Anemia, unspecified   . Anxiety   . Cavus deformity of foot, acquired   . Depression   . Dysthymic disorder   . Edema    bilateral  . Esophageal reflux   . Essential hypertension, benign   . Hx pulmonary embolism 2006   was on estrogen   . Insomnia, unspecified   . Major depressive disorder, single episode, unspecified   . Migraine, unspecified, without mention of intractable migraine without mention of status migrainosus   . Myalgia and myositis, unspecified   . Obesity, unspecified   . Other and unspecified hyperlipidemia   . Polycystic ovaries   . Unspecified gastritis and gastroduodenitis without mention of hemorrhage    Hasn't been having migraines. Occasional headaches.   Current Medications:  Outpatient Encounter Prescriptions as of 03/12/2017  Medication Sig  . AMBULATORY NON FORMULARY MEDICATION Medication Name: Kidney Factors take one tablet by mouth three times a day with meals  . Barberry-Oreg Grape-Goldenseal (BERBERINE COMPLEX PO) Take 450 mg by mouth 2 (two) times daily.  . Cholecalciferol (VITAMIN D3 PO) Take 5,000 Units by mouth 2 (two) times daily.  . Chromium Picolinate 500 MCG CAPS Take 1 capsule by mouth daily.  Marland Kitchen escitalopram (LEXAPRO) 20 MG tablet Take 20 mg by mouth daily.  Marland Kitchen lisinopril-hydrochlorothiazide (PRINZIDE,ZESTORETIC) 10-12.5 MG per tablet   . magnesium gluconate (MAGONATE) 500 MG tablet Take  500 mg by mouth daily.  . meloxicam (MOBIC) 15 MG tablet Take 15 mg by mouth daily as needed for pain.   . Milk Thistle 150 MG CAPS Take 150 mg by mouth 2 (two) times daily.  Marland Kitchen OVER THE COUNTER MEDICATION Take 1 Dose by mouth 2 (two) times daily. [facility]  . OVER THE COUNTER MEDICATION Take 250 mg by mouth 2 (two) times daily. Olive leaf  . OVER THE COUNTER MEDICATION Take 90 mcg by mouth 2 (two) times daily. Mk-7  . OVER THE COUNTER MEDICATION Take 200 mg by mouth daily. Selenium  . OVER THE COUNTER MEDICATION Take 500 mg by mouth 2 (two) times daily. N-A-C  . OVER THE COUNTER MEDICATION Take 300 mg by mouth 2 (two) times daily. Tumeric  . UNABLE TO FIND 10 mg as needed. Med Name: CBD oil   No facility-administered encounter medications on file as of 03/12/2017.    Also taking Liposomal Glutathione with BioGlute Red Yeast Rice Gall Bladder formula Power to Sleep PM Acti-Zyme Methyl B-12  Behavioral Observations:   Appearance: Neatly and appropriately dressed and groomed Gait: Ambulated independently Speech: Telegraphic/choppy quality of speech which is not present with distraction. Mild word finding difficulty. Thought process: Linear, goal directed Affect: Full, anxious Interpersonal: Pleasant, appropriate   TESTING: There is medical necessity to proceed with neuropsychological assessment as the results will be used to aid in differential diagnosis and clinical decision-making and to inform specific treatment recommendations. Per the patient, her husband and medical  records reviewed, there has been a change in cognitive functioning and a reasonable suspicion of neurocognitive disorder. There is also a need for objective testing of subjective cognitive complaints in order to determine neurologic versus psychiatric etiology of symptoms.    PLAN: The patient will return for a full battery of neuropsychological testing with a psychometrician under my supervision. Education  regarding testing procedures was provided. Subsequently, the patient will see this provider for a follow-up session at which time her test performances and my impressions and treatment recommendations will be reviewed in detail.  Full neuropsychological evaluation report to follow.

## 2017-03-13 ENCOUNTER — Encounter: Payer: Self-pay | Admitting: Psychology

## 2017-03-19 ENCOUNTER — Ambulatory Visit (INDEPENDENT_AMBULATORY_CARE_PROVIDER_SITE_OTHER): Payer: Managed Care, Other (non HMO) | Admitting: Psychology

## 2017-03-19 DIAGNOSIS — R413 Other amnesia: Secondary | ICD-10-CM

## 2017-03-19 NOTE — Progress Notes (Signed)
   Neuropsychology Note  [facility] returned today for 3 hours of neuropsychological testing with technician, Wallace Kellerana Chamberlain, BS, under the supervision of Dr. Elvis CoilMaryBeth Bailar. The patient did not appear overtly distressed by the testing session, per behavioral observation or via self-report to the technician. Rest breaks were offered. [facility] will return within 2 weeks for a feedback session with Dr. Alinda DoomsBailar at which time her test performances, clinical impressions and treatment recommendations will be reviewed in detail. The patient understands she can contact our office should she require our assistance before this time.  Full report to follow.

## 2017-03-26 ENCOUNTER — Encounter: Payer: BC Managed Care – PPO | Admitting: Psychology

## 2017-04-01 DIAGNOSIS — F444 Conversion disorder with motor symptom or deficit: Secondary | ICD-10-CM | POA: Diagnosis not present

## 2017-04-04 NOTE — Progress Notes (Signed)
NEUROPSYCHOLOGICAL EVALUATION   Name:    Ann Castillo  Date of Birth:   05-Jul-1960 Date of Interview:  03/12/2017 Date of Testing:  03/19/2017   Date of Feedback:  04/08/2017       Background Information:  Reason for Referral:  Ann Castillo is a 56 y.o. female referred by Dr. Lynder Castillo of [facility] to assess her current level of cognitive functioning and assist in differential diagnosis. The current evaluation consisted of a review of available medical records, an interview with the patient and her husband, Ann Castillo, and the completion of a neuropsychological testing battery. Informed consent was obtained.  History of Presenting Problem:  Ann Castillo reports abrupt onset of speech changes, hand tremors, gait changes and other physiological/neurologic symptoms in January 2016. 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 Since then, she reports she has difficulty with reading comprehension/retention and forgetfulness. 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 She is on disability.   Of note, she states that blood tests came back "inconclusive" for Lyme, but a "DNA test showed Lymes and 5 co-infections". 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  She reports her symptoms have waxed and waned since the initial onset in 2016 but have been completely absent. Her husband reports that stress, exercise, heat, blinking lights and loud noises all seem to exacerbate symptoms so they try to avoid these as much as possible. The patient and her husband report that CBD oil has been very helpful for symptoms and they can tell a dramatic difference if she forgets to take it. She sees Ann Castillo, a naturopathic provider, in [place].   She reports there are days when she gets disoriented driving and has to return home. Her  husband states she has short term memory problems, forgetting where she has put things and being less organized overall.  She also reports frequent racing thoughts and obsessiveness.   She reports that there are days when all her muscles are restless, muscles in her "feet, arms, perineum", feeling like they need to move.   Upon direct questioning, the patient and her husband reported the following with regard to current cognitive functioning:  Forgetting recent conversations/events: Yes Repeating statements/questions: Not significant Misplacing/losing items: Yes Forgetting appointments or other obligations: Obsessively goes over lists/appointments Forgetting to take medications: Sometimes. She takes a large number of supplements and homeopathic remedies.  Difficulty concentrating: Yes Starting but not finishing tasks: Yes Processing information more slowly: Yes Word-finding difficulty: Yes Writing difficulty: Tremors have influenced Spelling difficulty: Reduced Comprehension difficulty in conversation: Some days but not as a general rule Getting lost when driving: Yes Uncertain about directions when driving or passenger: Yes  She reports a family history of Alzheimer's disease in her mother, maternal uncle and paternal grandfather.  She independently manages complex ADLs. She manages the finances and has made errors.  She denied any personal history of head injury.   She reports sleep difficulties with both onset and maintenance. She denies problems with appetite. She has not had any hallucinations.   She reports her mood is "down" and "powerless". She also endorses significant anxiety. Generally she says she is an optimistic, upbeat person but lately she feels powerless. She does see a therapist and thinks this is helpful.   She has a history of some  anxiety prior to Lyme but not necessarily depression.  She is participating in [place], yoga and meditation. 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 She does stay busy during the week with things she enjoys and that are not too stressful for her.  She denies suicidal ideation or intention.  Per records reviewed, the patient has seen neurologists at [facility], most recently Ann Castillo a year ago. It was noted that her symptoms may be functional. Additional testing and second opinion was offered but the patient declined.   Social History: Born/Raised: [place], [place] Education: [employer] Occupational history: On disability Marital history: Married 62 years Children: 41yo, 5 yo, 95 yo No grandchildren Has lived here since 1998 Alcohol: 1-2 drinks a month Tobacco: Never SA: None   Medical History:  Past Medical History:  Diagnosis Date  . Anemia, unspecified   . Anxiety   . Cavus deformity of foot, acquired   . Depression   . Dysthymic disorder   . Edema    bilateral  . Esophageal reflux   . Essential hypertension, benign   . Hx pulmonary embolism 2006   was on estrogen   . Insomnia, unspecified   . Major depressive disorder, single episode, unspecified   . Migraine, unspecified, without mention of intractable migraine without mention of status migrainosus   . Myalgia and myositis, unspecified   . Obesity, unspecified   . Other and unspecified hyperlipidemia   . Polycystic ovaries   . Unspecified gastritis and gastroduodenitis without mention of hemorrhage     Hasn't been having migraines. Occasional headaches.  Current medications:  Outpatient Encounter Medications as of 04/08/2017  Medication Sig  . AMBULATORY NON FORMULARY MEDICATION Medication Name: Kidney Factors take one tablet by mouth three times a day with meals  . Barberry-Oreg Grape-Goldenseal (BERBERINE COMPLEX PO) Take 450 mg by mouth 2 (two) times daily.  . Cholecalciferol (VITAMIN D3 PO) Take 5,000 Units by mouth 2 (two) times daily.  . Chromium Picolinate 500 MCG CAPS  Take 1 capsule by mouth daily.  Marland Kitchen escitalopram (LEXAPRO) 20 MG tablet Take 20 mg by mouth daily.  Marland Kitchen lisinopril-hydrochlorothiazide (PRINZIDE,ZESTORETIC) 10-12.5 MG per tablet   . magnesium gluconate (MAGONATE) 500 MG tablet Take 500 mg by mouth daily.  . meloxicam (MOBIC) 15 MG tablet Take 15 mg by mouth daily as needed for pain.   . Milk Thistle 150 MG CAPS Take 150 mg by mouth 2 (two) times daily.  Marland Kitchen OVER THE COUNTER MEDICATION Take 1 Dose by mouth 2 (two) times daily. [facility]  . OVER THE COUNTER MEDICATION Take 250 mg by mouth 2 (two) times daily. Olive leaf  . OVER THE COUNTER MEDICATION Take 90 mcg by mouth 2 (two) times daily. Mk-7  . OVER THE COUNTER MEDICATION Take 200 mg by mouth daily. Selenium  . OVER THE COUNTER MEDICATION Take 500 mg by mouth 2 (two) times daily. N-A-C  . OVER THE COUNTER MEDICATION Take 300 mg by mouth 2 (two) times daily. Tumeric  . UNABLE TO FIND 10 mg as needed. Med Name: CBD oil   No facility-administered encounter medications on file as of 04/08/2017.    Also taking Liposomal Glutathione with BioGlute Red Yeast Rice Gall Bladder formula Power to Sleep PM Acti-Zyme Methyl B-12  Current Examination:  Behavioral Observations:  Appearance: Neatly and appropriately dressed and groomed Gait: Ambulated independently Speech: Telegraphic/choppy quality of speech which is not present with distraction. Mild word finding difficulty. Thought process: Linear,  goal directed Affect: Full, anxious Interpersonal: Pleasant, appropriate Orientation: Oriented to all spheres. Accurately named the current President and his predecessor.   Tests Administered: . Test of Premorbid Functioning (TOPF) . Wechsler Adult Intelligence Scale-Fourth Edition (WAIS-IV): Similarities, Information, Block Design, Matrix Reasoning, Arithmetic, Symbol Search, Coding and Digit Span subtests . Wechsler Memory Scale-Fourth Edition (WMS-IV) Adult Version (ages 62-69): Logical Memory I,  II and Recognition subtests  . [place] Verbal Learning Test - 2nd Edition (CVLT-2) Standard Form . [employer] (WCST) . Repeatable Battery for the Assessment of Neuropsychological Status (RBANS) Form A:  Figure Copy and Figure Recall subtests and Semantic Fluency subtest . Neuropsychological Assessment Battery (NAB) Language Module Form 1:  Naming subtest . Controlled Oral Word Association Test (COWAT) . Trail Making Test A and B . Boston Diagnostic Aphasia Examination (BDAE): Complex Ideational Material Subtest . Clock Drawing Test . Beck Depression Inventory - Second edition (BDI-II) . Personality Assessment Inventory (PAI)  Test Results: Note: Standardized scores are presented only for use by appropriately trained professionals and to allow for any future test-retest comparison. These scores should not be interpreted without consideration of all the information that is contained in the rest of the report. The most recent standardization samples from the test publisher or other sources were used whenever possible to derive standard scores; scores were corrected for age, gender, ethnicity and education when available.   Test Scores:  Test Name Raw Score Standardized Score Descriptor  TOPF 64/70 SS= 121 Superior  WAIS-IV Subtests     Similarities 30/36 ss= 13 High average  Information 22/26 ss= 14 Superior  Block Design 48/66 ss= 13 High average  Matrix Reasoning 21/26 ss= 14 Superior  Arithmetic 17/22 ss= 12 High average  Symbol Search 37/60 ss= 13 High average  Coding 62/135 ss= 10 Average  Digit Span 20/48 ss= 7 Low average  WAIS-IV Index Scores     Verbal Comprehension  SS= 120 Superior  Perceptual Reasoning  SS= 121 Superior  Working Memory  SS= 97 Average  Processing Speed  SS= 108 Average  Full Scale IQ (8 subtest)  SS= 113 High average  WMS-IV Subtests     LM I 22/50 ss= 9 Average  LM II 20/50 ss= 10 Average  LM II Recognition 27/30 Cum %: >75 WNL    CVLT-II Scores     Trial 1 4/16 Z= -1.5 Borderline  Trial 5 12/16 Z= -0.5 Average  Trials 1-5 total 47/80 T= 47 Average  SD Free Recall 10/16 Z= -0.5 Average  SD Cued Recall 12/16 Z= 0 Average  LD Free Recall 10/16 Z= -0.5 Average  LD Cued Recall 11/16 Z= -0.5 Average  Recognition Discriminability 11/16 hits; 0 false positives Z= -0.5 Average  Forced Choice Recognition 16/16  WNL  WCST     Total Errors 25 T= 38 Low average  Perseverative Responses 13 T= 43 Average  Perseverative Errors 10 T= 45 Average  Conceptual Level Responses 27 T= 33 Borderline  Categories Completed 1 6-10% Below expectation  Trials to Complete 1st Category 13 >16% WNL  Failure to Maintain Set 1    RBANS Subtest     Figure Copy 19/20 Z= 0.6 Average  Figure Recall 12/20 Z= -0.5 Average  Semantic Fluency 34/40 Z= 2.6 Very superior  NAB Language Naming 31/31 T= 55 Average  COWAT-FAS 46 T= 51 Average  COWAT-Animals 26 T= 58 High average  Trail Making Test A  32" 0 errors T= 53 Average  Trail Making Test B  98" 1 error T= 41 Low average  BDAE Complex Ideational Material 12/12  WNL  Clock Drawing Test   WNL  BDI-II 23/63  Moderate  PAI  Only elevated clinical scales are shown here:   SOM  T= 72   ANX  T= 86   ARD  T= 77   SCZ  T= 77      Description of Test Results:  Embedded performance validity indicators were within normal limits. As such, the patient's current performance on neurocognitive testing is judged to be a relatively accurate representation of her current level of neurocognitive functioning.   Premorbid verbal intellectual abilities were estimated to have been within the superior range based on a test of word reading. Consistent with this, current verbal and non-verbal intellectual abilities both fell within the superior range.  Psychomotor processing speed was average. Auditory attention and working memory were average. Visual-spatial construction was average to high average. Language  abilities assessed were normal. Specifically, confrontation naming was intact, and semantic verbal fluency was high average to very superior. Auditory comprehension of complex ideational material was intact. With regard to verbal memory, encoding and acquisition of non-contextual information (i.e., word list) was average across five learning trials. After a brief interference task, free recall was average (10/16 items). With semantic cueing, she recalled an additional two items (average). After a delay, free recall was average (10/16 items). Cued recall was average (11/16 items). Performance on a yes/no recognition task was average overall, but she did demonstrated lower than expected recognition of target items (with no false positive errors). On another verbal memory test, encoding and acquisition of contextual auditory information (i.e., short stories) was average. After a delay, free recall was average. Performance on a yes/no recognition task was intact. With regard to non-verbal memory, delayed free recall of visual information was average. Executive functioning was variable overall. Mental flexibility and set-shifting were low average on Trails B. Verbal fluency with phonemic search restrictions was average. Verbal abstract reasoning was high average. Non-verbal abstract reasoning was superior. On a test of deductive reasoning, and mental flexibility, she quickly identified the first pattern/rule, but was unable to identify the second one when it switched without warning, despite multiple attempts of trial and error. Performance on a clock drawing task was intact.   On a self-report measure of mood, the patient's responses were indicative of clinically significant depression at the present time. She denied suicidal ideation or intention.   On a more extensive measure of psychopathology and personality function (PAI), her responses yielded a valid profile. Her PAI clinical profile is marked by significant  elevations across a number of different scales, indicating a broad range of clinical features and increasing the possibility of multiple diagnoses.  The configuration of the clinical scales suggests a person with significant thinking and concentration problems, accompanied by prominent agitation and distress.   The patient indicates that she is experiencing a discomforting level of anxiety and tension.  She is likely to be plagued by worry to the degree that her ability to concentrate and attend are significantly compromised.  Associates are likely to comment about her overconcern regarding issues and events over which she has no control.  Affectively, she feels a great deal of tension, has difficulty relaxing, and likely experiences fatigue as a result of high perceived stress.  Overt physical signs of tension and stress, such as sweaty palms, trembling hands, complaints of irregular heartbeats, and shortness of breath are also present. A number of aspects of  the patient's self-description suggest noteworthy peculiarities in thinking and experience.  Her pattern of responses suggests that her thought processes are likely to be marked by confusion, indecision, distractibility, and difficulty concentrating.  She may experience her thoughts as being somehow blocked or disrupted.  Active psychotic symptoms such as hallucinations or delusions do NOT appear to be a prominent part of the clinical picture at this time. The patient indicates that she is experiencing specific fears or anxiety surrounding some situations.  The pattern of responses reveals that she is likely to display a variety of maladaptive behavior patterns aimed at controlling anxiety.  For instance, phobic behaviors are likely to interfere in some significant way in her life, and it is probable that she monitors her environment in a vigilant fashion to avoid contact with the feared object or situation.  She is more likely to have multiple phobias or a  more distressing phobia, such as agoraphobia, than to suffer from a simple phobia. She is probably also seen by others as being something of a perfectionist.  She is likely to be a fairly rigid individual who follows her personal guidelines for conduct in an inflexible and unyielding manner.  She ruminates about matters to the degree that she often has difficulty making decisions and perceiving the larger significance of decisions that are made.  Changes in routine, unexpected events, and contradictory information are likely to generate untoward stress.  She may fear her own impulses and doubt her ability to control them. The patient demonstrates a high degree of concern about physical functioning and health matters and probable impairment arising from somatic symptoms.  She is likely to report that her daily functioning has been compromised by numerous and varied physical problems.  Even though he may appear to be relatively healthy to other observers, she is likely to see herself as having a history of complex medical problems.  She probably feels that her health is not as good as that of her age peers.  She is likely to be continuously concerned with her health status and physical problems.  Her self-image may be largely influenced by a belief that she is handicapped by her poor health. The patient reports some difficulties consistent with relatively mild or transient depressive symptomatology.   The patient describes herself as rather moody and others may view her as overly sensitive.  She may be dissatisfied with her more important relationships and uncertain about major life goals. According to the patient's self-report, she describes NO significant problems in the following areas: antisocial behavior; problems with empathy.  Also, she reports NO significant problems with alcohol or drug abuse or dependence.  With respect to suicidal ideation, the patient is NOT reporting distress from thoughts of  self-harm.   Clinical Impressions: Anxiety disorder, unspecified (with aspects of generalized anxiety, panic and OCD). Mild depression. Cognitive testing results were entirely within normal limits for her age. Most performances were well above average for her age, often in the superior range. She did demonstrate an area of relative weakness in mental flexibility (an aspect of executive functioning). Overall, these testing results certainly do not indicate the presence of dementia or neurocognitive disorder, and there are no red flags for underlying Alzheimer's disease.  With regard to psychological functioning, she presents with significant anxiety, as well as some depression. These factors are likely contributing to her cognitive complaints.   Recommendations/Plan: Based on the findings of the present evaluation, the following recommendations are offered:  1. Continue mental health treatment (  psychiatry and therapy). 2. Strategies to maintain brain health include optimal control of vascular risk factors, regular cardiovascular exercise, adherence to dietary recommendations of your physician, regular social interaction and mental stimulation. 3. If there is a concern about declining cognitive abilities in the future, neuropsychological re-evaluation could be considered.   Feedback to Patient: Ann Castillo and her husband returned for a feedback appointment on 04/08/2017 to review the results of her neuropsychological evaluation with this provider. 30 minutes face-to-face time was spent reviewing her test results, my impressions and my recommendations as detailed above.   Per her written authorization and request, a copy of this report will be sent to Dr. Mylinda Latina and to the patient.   Total time spent on this patient's case: [id number] unit for interview with psychologist; [phone] units of testing by psychometrician under psychologist's supervision; [phone] units for medical record  review, scoring of neuropsychological tests, interpretation of test results, preparation of this report, and review of results to the patient by psychologist.      Thank you for your referral of [facility]. Please feel free to contact me if you have any questions or concerns regarding this report.

## 2017-04-08 ENCOUNTER — Encounter: Payer: Self-pay | Admitting: Psychology

## 2017-04-08 ENCOUNTER — Ambulatory Visit (INDEPENDENT_AMBULATORY_CARE_PROVIDER_SITE_OTHER): Payer: Managed Care, Other (non HMO) | Admitting: Psychology

## 2017-04-08 DIAGNOSIS — R413 Other amnesia: Secondary | ICD-10-CM | POA: Diagnosis not present

## 2017-04-08 NOTE — Patient Instructions (Signed)
Fortunately, cognitive testing results were entirely within normal limits for age. Most performances were well above average for age, often in the superior range. Meanwhile, an area of relative weakness was mental flexibility (an aspect of executive functioning).   Overall, these testing results certainly do not indicate the presence of dementia or major neurocognitive disorder.   Based on these test results, we would not expect significant neurocognitive impairment in daily life. However, secondary factors, such as severe anxiety, can certainly affect cognitive functioning daily life and may be the culprit.   Based on the available data, it appears very likely that her cognitive complaints are secondary to anxiety.   Recommendations/Plan:  1. Continue mental health treatment (psychiatry and therapy).  2. Strategies to maintain brain health include optimal control of vascular risk factors, regular cardiovascular exercise, adherence to dietary recommendations of your physician, regular social interaction and mental stimulation.  3. If there is a concern about declining cognitive abilities in the future, neuropsychological re-evaluation could be considered.

## 2017-04-15 DIAGNOSIS — F444 Conversion disorder with motor symptom or deficit: Secondary | ICD-10-CM | POA: Diagnosis not present

## 2017-04-24 DIAGNOSIS — F444 Conversion disorder with motor symptom or deficit: Secondary | ICD-10-CM | POA: Diagnosis not present

## 2017-04-30 ENCOUNTER — Telehealth: Payer: Self-pay | Admitting: Family Medicine

## 2017-04-30 DIAGNOSIS — F444 Conversion disorder with motor symptom or deficit: Secondary | ICD-10-CM | POA: Diagnosis not present

## 2017-04-30 NOTE — Telephone Encounter (Signed)
Copied from CRM [phone]. Topic: Quick Communication - See Telephone Encounter >> Apr 30, 2017  4:27 PM Rudi CocoLathan, Joni Norrod M, NT wrote: CRM for notification. See Telephone encounter for:   04/30/17. Feliz Beamravis from [facility] calling to get rx. Refill on meloxicam (mobic) 15mg  Tablet (90 day supply)  Feliz Beamravis can be reached at [phone]  [id number] [place] [address] [place] [id number] Phone: [phone] Fax: [phone] Not a 24 hour pharmacy; exact hours not known

## 2017-05-02 NOTE — Telephone Encounter (Signed)
Call placed to [facility] [address] to inquire about request for refill on Meloxicam.  Advised the pharmacist that there is no record of the pt. being seen by one of the providers in our system, to authorize this refill.  Advised that the Rx will be denied.

## 2017-05-13 DIAGNOSIS — M2242 Chondromalacia patellae, left knee: Secondary | ICD-10-CM | POA: Diagnosis not present

## 2017-12-10 ENCOUNTER — Ambulatory Visit: Payer: Medicare Other | Admitting: Women's Health

## 2017-12-10 ENCOUNTER — Ambulatory Visit (INDEPENDENT_AMBULATORY_CARE_PROVIDER_SITE_OTHER): Payer: Medicare Other

## 2017-12-10 ENCOUNTER — Encounter: Payer: Self-pay | Admitting: Women's Health

## 2017-12-10 VITALS — BP 124/82

## 2017-12-10 DIAGNOSIS — R1031 Right lower quadrant pain: Secondary | ICD-10-CM

## 2017-12-10 NOTE — Progress Notes (Signed)
57 year old MWF G5, P3 presents with complaint of right lower cramping that radiates to her back for the past 4 weeks.  Increasing in intensity.  Has started a very low carb diet 2 weeks ago she is the only change she can think of.  History of ovarian cyst that she states feels similar to this.  History of a kidney stone but states was a different kind of discomfort.  Has had chronic pelvic/back pain but has noticed a change.  Denies any vaginal discharge, urinary symptoms, pain burning pressure sensation, no changes in bowel elimination, denies nausea or changes in appetite.  Postmenopausal with no bleeding on no HRT.  History of a PE, anxiety and depression, Lyme disease, hypertension primary care manages.  Not sexually active in years.  History of PCOS.  Exam: Appears well, obese Ultrasound: Anteverted uterus, endometrium 4.9 mm, right ovary not visualized, left ovary 3 cc, no apparent mass right or left adnexal.  Negative CDS.  Nabothian cyst cervix  Normal GYN ultrasound  Plan: UA pending, reviewed ultrasound findings, reassurance given.  Reviewed possibly GI, dietary changes causing increased discomfort.

## 2017-12-13 LAB — URINALYSIS, COMPLETE W/RFL CULTURE
BACTERIA UA: NONE SEEN /HPF
BILIRUBIN URINE: NEGATIVE
Glucose, UA: NEGATIVE
HGB URINE DIPSTICK: NEGATIVE
HYALINE CAST: NONE SEEN /LPF
Leukocyte Esterase: NEGATIVE
NITRITES URINE, INITIAL: NEGATIVE
PH: 5.5 (ref 5.0–8.0)
Protein, ur: NEGATIVE
Specific Gravity, Urine: 1.029 (ref 1.001–1.03)

## 2017-12-13 LAB — URINE CULTURE
MICRO NUMBER:: 90851583
SPECIMEN QUALITY:: ADEQUATE

## 2017-12-13 LAB — CULTURE INDICATED

## 2017-12-30 ENCOUNTER — Other Ambulatory Visit (HOSPITAL_COMMUNITY): Payer: Self-pay | Admitting: Internal Medicine

## 2017-12-30 DIAGNOSIS — R1030 Lower abdominal pain, unspecified: Secondary | ICD-10-CM

## 2018-01-03 ENCOUNTER — Ambulatory Visit (HOSPITAL_COMMUNITY)
Admission: RE | Admit: 2018-01-03 | Discharge: 2018-01-03 | Disposition: A | Discharge status: Home / Self Care | Payer: Medicare Other | Source: Ambulatory Visit | Attending: Internal Medicine | Admitting: Internal Medicine

## 2018-01-03 DIAGNOSIS — K8020 Calculus of gallbladder without cholecystitis without obstruction: Secondary | ICD-10-CM | POA: Insufficient documentation

## 2018-01-03 DIAGNOSIS — R1030 Lower abdominal pain, unspecified: Secondary | ICD-10-CM | POA: Diagnosis present

## 2018-01-03 DIAGNOSIS — I700 Atherosclerosis of aorta: Secondary | ICD-10-CM | POA: Insufficient documentation

## 2018-01-03 DIAGNOSIS — K760 Fatty (change of) liver, not elsewhere classified: Secondary | ICD-10-CM | POA: Diagnosis not present

## 2018-01-18 ENCOUNTER — Emergency Department (HOSPITAL_COMMUNITY): Payer: Medicare Other

## 2018-01-18 ENCOUNTER — Emergency Department (HOSPITAL_COMMUNITY)
Admission: EM | Admit: 2018-01-18 | Discharge: 2018-01-18 | Disposition: A | Discharge status: Home / Self Care | Payer: Medicare Other | Attending: Emergency Medicine | Admitting: Emergency Medicine

## 2018-01-18 ENCOUNTER — Other Ambulatory Visit: Payer: Self-pay

## 2018-01-18 DIAGNOSIS — R42 Dizziness and giddiness: Secondary | ICD-10-CM | POA: Diagnosis not present

## 2018-01-18 DIAGNOSIS — Z91040 Latex allergy status: Secondary | ICD-10-CM | POA: Insufficient documentation

## 2018-01-18 DIAGNOSIS — Y999 Unspecified external cause status: Secondary | ICD-10-CM | POA: Insufficient documentation

## 2018-01-18 DIAGNOSIS — I10 Essential (primary) hypertension: Secondary | ICD-10-CM | POA: Diagnosis not present

## 2018-01-18 DIAGNOSIS — S0181XA Laceration without foreign body of other part of head, initial encounter: Secondary | ICD-10-CM

## 2018-01-18 DIAGNOSIS — W19XXXA Unspecified fall, initial encounter: Secondary | ICD-10-CM | POA: Diagnosis not present

## 2018-01-18 DIAGNOSIS — R531 Weakness: Secondary | ICD-10-CM | POA: Diagnosis present

## 2018-01-18 DIAGNOSIS — Z79899 Other long term (current) drug therapy: Secondary | ICD-10-CM | POA: Insufficient documentation

## 2018-01-18 DIAGNOSIS — Y939 Activity, unspecified: Secondary | ICD-10-CM | POA: Insufficient documentation

## 2018-01-18 DIAGNOSIS — Y929 Unspecified place or not applicable: Secondary | ICD-10-CM | POA: Diagnosis not present

## 2018-01-18 DIAGNOSIS — S01511A Laceration without foreign body of lip, initial encounter: Secondary | ICD-10-CM | POA: Insufficient documentation

## 2018-01-18 MED ORDER — LIDOCAINE HCL (PF) 1 % IJ SOLN
30.0000 mL | Freq: Once | INTRAMUSCULAR | Status: DC
  Filled 2018-01-18: qty 30

## 2018-01-18 NOTE — Discharge Instructions (Signed)
As discussed, your sutures will dissolve in the coming days.  Please monitor your condition, and do not hesitate to return for any concerning changes in your condition.

## 2018-01-18 NOTE — ED Triage Notes (Signed)
Patient was walking to gym felt weak and fell. Initially had right-sided deficits but resolved enroute. Patient has left hand, eye lac with slight blood in nose area.

## 2018-01-18 NOTE — ED Notes (Signed)
Patient verbalizes understanding of discharge instructions. Opportunity for questioning and answers were provided. pt discharged from ED via wheelchair to the lobby.

## 2018-01-18 NOTE — ED Provider Notes (Signed)
[facility] EMERGENCY DEPARTMENT Provider Note   CSN: [id number] Arrival date & time: 01/18/18  1130     History   Chief Complaint Chief Complaint  Patient presents with  . Fall  . Weakness    HPI Ann Castillo is a 57 y.o. female.  HPI Presents after a fall. She was going to the gym, and similarly felt lightheaded, or twisted her ankle and fell to the ground. She arrives via EMS who reports the former story, the patient recalls the latter. No loss of consciousness. However, since the fall the patient has had substantial severe sharp pain throughout her face, as she fell face forwards. She also has pain in her left hand. Patient is awake alert, answering questions appropriately, but has substantial evidence for maxillofacial trauma. She states that she was doing generally well prior to the event, has no current lightheadedness.  Past Medical History:  Diagnosis Date  . Anemia, unspecified   . Anxiety   . Cavus deformity of foot, acquired   . Depression   . Dysthymic disorder   . Edema    bilateral  . Esophageal reflux   . Essential hypertension, benign   . Hx pulmonary embolism 2006   was on estrogen   . Insomnia, unspecified   . Major depressive disorder, single episode, unspecified   . Migraine, unspecified, without mention of intractable migraine without mention of status migrainosus   . Myalgia and myositis, unspecified   . Obesity, unspecified   . Other and unspecified hyperlipidemia   . Polycystic ovaries   . Unspecified gastritis and gastroduodenitis without mention of hemorrhage     Patient Active Problem List   Diagnosis Date Noted  . 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 Facial pain 11/19/2012  . Dyspnea 09/19/2011  . Allergic rhinitis 08/20/2011  . Chest pain on exertion 04/17/2011  . Diarrhea 03/01/2011  . Abnormal facial hair 01/01/2011  . Chronic cough 10/31/2009  .  DEPRESSION/ANXIETY 08/11/2009  . Essential hypertension, benign 08/11/2009  . INSOMNIA 05/05/2009  . CAVUS DEFORMITY OF FOOT, ACQUIRED 09/30/2008  . Obesity, unspecified 07/08/2008  . UNSPECIFIED ANEMIA 07/08/2008  . LEG EDEMA, BILATERAL 07/08/2008  . ADVERSE DRUG REACTION 03/08/2008  . GERD 05/01/2007  . HYPERLIPIDEMIA 07/25/2006  . MIGRAINE, UNSPEC., W/O INTRACTABLE MIGRAINE 07/25/2006  . PULMONARY EMBOLI 07/25/2006    Past Surgical History:  Procedure Laterality Date  . no surgical history       OB History    Gravida  5   Para  3   Term      Preterm      AB  1   Living  3     SAB  1   TAB      Ectopic      Multiple      Live Births               Home Medications    Prior to Admission medications   Medication Sig Start Date End Date Taking? Authorizing Provider  AMBULATORY NON FORMULARY MEDICATION Medication Name: Kidney Factors take one tablet by mouth three times a day with meals    [provider]  Barberry-Oreg Grape-Goldenseal (BERBERINE COMPLEX PO) Take 450 mg by mouth 2 (two) times daily.    [provider]  Cholecalciferol (VITAMIN D3 PO) Take 5,000 Units by mouth 2 (two) times daily.    [provider]  escitalopram (LEXAPRO) 20 MG tablet Take 30 mg by  mouth daily.     [provider]  lisinopril-hydrochlorothiazide (PRINZIDE,ZESTORETIC) 10-12.5 MG per tablet Take 1 tablet by mouth daily.  05/15/13   [provider]  magnesium gluconate (MAGONATE) 500 MG tablet Take 500 mg by mouth daily.    [provider]  meloxicam (MOBIC) 15 MG tablet Take 15 mg by mouth daily as needed for pain.  02/05/11   [provider]  Milk Thistle 150 MG CAPS Take 150 mg by mouth 2 (two) times daily.    [provider]  OVER THE COUNTER MEDICATION Take 500 mg by mouth 2 (two) times daily. N-A-C    [provider]  OVER THE COUNTER MEDICATION Take 300 mg by mouth 2 (two) times daily.  Tumeric    [provider]  UNABLE TO FIND 10 mg as needed. Med Name: CBD oil    [provider]    Family History Family History  Problem Relation Age of Onset  . Diabetes Father   . Heart disease Father   . Liver cancer Father   . Pancreatic cancer Father   . Gallbladder disease Father   . Heart attack Father        x5, first @ 59  . Miscarriages / [place] Mother   . Alzheimer's disease Mother   . Learning disabilities Daughter        60 y.o mental capacity  . ADD / ADHD Daughter   . Breast cancer Maternal Grandmother   . Alzheimer's disease Paternal Grandfather   . Alzheimer's disease Maternal Uncle   . Hypertension Son   . Tourette syndrome Son   . Personality disorder Daughter   . ADD / ADHD Daughter   . Colon cancer Neg Hx     Social History Social History   Tobacco Use  . Smoking status: Never Smoker  . Smokeless tobacco: Never Used  Substance Use Topics  . Alcohol use: Yes    Alcohol/week: 0.5 standard drinks    Types: 1 Glasses of wine per week    Comment: occasional glass of wine, 1/2 beer per month, martini every 3 weeks  . Drug use: No     Allergies   Celecoxib; Codeine phosphate; Dilaudid [hydromorphone hcl]; Estrogen; Latex; Pravastatin sodium; and Rosuvastatin   Review of Systems Review of Systems  Constitutional:       Per HPI, otherwise negative  HENT:       Per HPI, otherwise negative  Respiratory:       Per HPI, otherwise negative  Cardiovascular:       Per HPI, otherwise negative  Gastrointestinal: Negative for vomiting.  Endocrine:       Negative aside from HPI  Genitourinary:       Neg aside from HPI   Musculoskeletal:       Per HPI, otherwise negative  Skin: Positive for wound.  Neurological: Positive for light-headedness.       Chronic Lyme disease according the patient     Physical Exam Updated Vital Signs BP 118/65 (BP Location: Right Arm)   Pulse 60   Temp (!) 97.1 F (36.2 C) (Temporal)    Resp 16   Ht 5\' 4"  (1.626 m)   Wt 124.7 kg   LMP 12/26/2012   SpO2 98%   BMI 47.20 kg/m   Physical Exam  Constitutional: She is oriented to person, place, and time. She appears well-developed and well-nourished. No distress.  Obese female awake and alert.  HENT:  Head: Normocephalic.  Eyes: Conjunctivae and EOM are normal.  Neck:  Cervical collar in place  Cardiovascular: Normal rate and regular rhythm.  Pulmonary/Chest: Effort normal and breath sounds normal. No stridor. No respiratory distress.  Abdominal: She exhibits no distension.  Musculoskeletal: She exhibits no edema.  No substantial deformities, though she has tenderness to palpation about the left wrist, she has preserved range of motion in this area. She notes that she always has some knee pain, this is seemingly unchanged.   Neurological: She is alert and oriented to person, place, and time. She displays no atrophy and no tremor. No cranial nerve deficit. She exhibits normal muscle tone. She displays no seizure activity. Coordination normal.  Skin: Skin is warm and dry.     Psychiatric: She has a normal mood and affect.  Awake and alert, making jokes.  Nursing note and vitals reviewed.    ED Treatments / Results   EKG EKG Interpretation  Date/Time:  Saturday January 18 2018 11:45:41 EDT Ventricular Rate:  73 PR Interval:    QRS Duration: 95 QT Interval:  412 QTC Calculation: 454 R Axis:   25 Text Interpretation:  Sinus rhythm Low voltage, precordial leads T wave abnormality Abnormal ekg Confirmed by Gerhard Munch [phone]) on 01/18/2018 11:50:21 AM   Radiology Ct Head Wo Contrast  Result Date: 01/18/2018 CLINICAL DATA:  57 year old with fall and facial laceration. EXAM: CT HEAD WITHOUT CONTRAST CT MAXILLOFACIAL WITHOUT CONTRAST CT CERVICAL SPINE WITHOUT CONTRAST TECHNIQUE: Multidetector CT imaging of the head, cervical spine, and maxillofacial structures were performed using the standard protocol  without intravenous contrast. Multiplanar CT image reconstructions of the cervical spine and maxillofacial structures were also generated. COMPARISON:  Head CT 05/31/2014 and chest CT from 08/05/2008 FINDINGS: CT HEAD FINDINGS Brain: No evidence of acute infarction, hemorrhage, hydrocephalus, extra-axial collection or mass lesion/mass effect. Vascular: No hyperdense vessel or unexpected calcification. Skull: No calvarial fracture.  Left nasal bone fractures. Other: None. CT MAXILLOFACIAL FINDINGS Osseous: Comminuted nasal bone fractures particularly along the left side. Nasal septum deviates towards the left side but there is not clearly a fracture involving the nasal septum. Pterygoid plates are intact. Mandible is intact. Chronic spurring at the left mandibular condyle. Orbits and sinuses are intact. Zygomatic arches are intact. Orbits: Negative. No traumatic or inflammatory finding. Sinuses: Minimal mucosal disease in the right maxillary sinus. Soft tissues: Soft tissue swelling involving the nose, particularly along the left side. There is some left periorbital edema. Small lymph nodes on both sides of the neck. Normal appearance of the globes. CT CERVICAL SPINE FINDINGS Alignment: Alignment is within normal limits. Skull base and vertebrae: No acute fracture. No primary bone lesion or focal pathologic process. Soft tissues and spinal canal: No prevertebral fluid or swelling. No visible canal hematoma. Again noted is asymmetric enlargement of the left thyroid lobe and cannot exclude a nodule in this area. Findings are similar to the chest CT from 2010. Disc levels:  Disc spaces are maintained. Upper chest: Negative. Other: None IMPRESSION: No acute intracranial abnormality. Comminuted nasal bone fractures. No acute abnormality in the cervical spine. Chronic enlargement of left thyroid lobe and cannot exclude a nodule. Electronically Signed   By: Richarda Overlie M.D.   On: 01/18/2018 13:36   Ct Cervical Spine Wo  Contrast  Result Date: 01/18/2018 CLINICAL DATA:  57 year old with fall and facial laceration. EXAM: CT HEAD WITHOUT CONTRAST CT MAXILLOFACIAL WITHOUT CONTRAST CT CERVICAL SPINE WITHOUT CONTRAST TECHNIQUE: Multidetector CT imaging of the head, cervical spine, and maxillofacial  structures were performed using the standard protocol without intravenous contrast. Multiplanar CT image reconstructions of the cervical spine and maxillofacial structures were also generated. COMPARISON:  Head CT 05/31/2014 and chest CT from 08/05/2008 FINDINGS: CT HEAD FINDINGS Brain: No evidence of acute infarction, hemorrhage, hydrocephalus, extra-axial collection or mass lesion/mass effect. Vascular: No hyperdense vessel or unexpected calcification. Skull: No calvarial fracture.  Left nasal bone fractures. Other: None. CT MAXILLOFACIAL FINDINGS Osseous: Comminuted nasal bone fractures particularly along the left side. Nasal septum deviates towards the left side but there is not clearly a fracture involving the nasal septum. Pterygoid plates are intact. Mandible is intact. Chronic spurring at the left mandibular condyle. Orbits and sinuses are intact. Zygomatic arches are intact. Orbits: Negative. No traumatic or inflammatory finding. Sinuses: Minimal mucosal disease in the right maxillary sinus. Soft tissues: Soft tissue swelling involving the nose, particularly along the left side. There is some left periorbital edema. Small lymph nodes on both sides of the neck. Normal appearance of the globes. CT CERVICAL SPINE FINDINGS Alignment: Alignment is within normal limits. Skull base and vertebrae: No acute fracture. No primary bone lesion or focal pathologic process. Soft tissues and spinal canal: No prevertebral fluid or swelling. No visible canal hematoma. Again noted is asymmetric enlargement of the left thyroid lobe and cannot exclude a nodule in this area. Findings are similar to the chest CT from 2010. Disc levels:  Disc spaces are  maintained. Upper chest: Negative. Other: None IMPRESSION: No acute intracranial abnormality. Comminuted nasal bone fractures. No acute abnormality in the cervical spine. Chronic enlargement of left thyroid lobe and cannot exclude a nodule. Electronically Signed   By: Richarda OverlieAdam  Henn M.D.   On: 01/18/2018 13:36   Dg Pelvis Portable  Result Date: 01/18/2018 CLINICAL DATA:  Fall EXAM: PORTABLE PELVIS 1-2 VIEWS COMPARISON:  CT abdomen/pelvis dated 01/03/2018 FINDINGS: No fracture or dislocation is seen. Bilateral hip joint spaces are symmetric. Visualized bony pelvis appears intact. IMPRESSION: Negative. Electronically Signed   By: Charline BillsSriyesh  Krishnan M.D.   On: 01/18/2018 12:21   Dg Chest Port 1 View  Result Date: 01/18/2018 CLINICAL DATA:  Fall EXAM: PORTABLE CHEST 1 VIEW COMPARISON:  05/31/2014 FINDINGS: Lungs are clear.  No pleural effusion or pneumothorax. The heart is top-normal in size. IMPRESSION: No evidence of acute cardiopulmonary disease. Electronically Signed   By: Charline BillsSriyesh  Krishnan M.D.   On: 01/18/2018 12:21   Dg Hand Complete Left  Result Date: 01/18/2018 CLINICAL DATA:  Fall, left hand laceration EXAM: LEFT HAND - COMPLETE 3+ VIEW COMPARISON:  None. FINDINGS: No fracture or dislocation is seen. The joint spaces are preserved. The visualized soft tissues are unremarkable. IMPRESSION: Negative. Electronically Signed   By: Charline BillsSriyesh  Krishnan M.D.   On: 01/18/2018 12:22   Ct Maxillofacial Wo Contrast  Result Date: 01/18/2018 CLINICAL DATA:  57 year old with fall and facial laceration. EXAM: CT HEAD WITHOUT CONTRAST CT MAXILLOFACIAL WITHOUT CONTRAST CT CERVICAL SPINE WITHOUT CONTRAST TECHNIQUE: Multidetector CT imaging of the head, cervical spine, and maxillofacial structures were performed using the standard protocol without intravenous contrast. Multiplanar CT image reconstructions of the cervical spine and maxillofacial structures were also generated. COMPARISON:  Head CT 05/31/2014 and chest CT  from 08/05/2008 FINDINGS: CT HEAD FINDINGS Brain: No evidence of acute infarction, hemorrhage, hydrocephalus, extra-axial collection or mass lesion/mass effect. Vascular: No hyperdense vessel or unexpected calcification. Skull: No calvarial fracture.  Left nasal bone fractures. Other: None. CT MAXILLOFACIAL FINDINGS Osseous: Comminuted nasal bone fractures particularly along the left side.  Nasal septum deviates towards the left side but there is not clearly a fracture involving the nasal septum. Pterygoid plates are intact. Mandible is intact. Chronic spurring at the left mandibular condyle. Orbits and sinuses are intact. Zygomatic arches are intact. Orbits: Negative. No traumatic or inflammatory finding. Sinuses: Minimal mucosal disease in the right maxillary sinus. Soft tissues: Soft tissue swelling involving the nose, particularly along the left side. There is some left periorbital edema. Small lymph nodes on both sides of the neck. Normal appearance of the globes. CT CERVICAL SPINE FINDINGS Alignment: Alignment is within normal limits. Skull base and vertebrae: No acute fracture. No primary bone lesion or focal pathologic process. Soft tissues and spinal canal: No prevertebral fluid or swelling. No visible canal hematoma. Again noted is asymmetric enlargement of the left thyroid lobe and cannot exclude a nodule in this area. Findings are similar to the chest CT from 2010. Disc levels:  Disc spaces are maintained. Upper chest: Negative. Other: None IMPRESSION: No acute intracranial abnormality. Comminuted nasal bone fractures. No acute abnormality in the cervical spine. Chronic enlargement of left thyroid lobe and cannot exclude a nodule. Electronically Signed   By: Richarda Overlie M.D.   On: 01/18/2018 13:36    Procedures  LACERATION REPAIR Performed by: Gerhard Munch Authorized by: Gerhard Munch Consent: Verbal consent obtained. Risks and benefits: risks, benefits and alternatives were  discussed Consent given by: patient Patient identity confirmed: provided demographic data Prepped and Draped in normal sterile fashion Wound explored  Laceration Location: L eyelid/nasal bridge  Laceration Length: 4cm -V-shaped  No Foreign Bodies seen or palpated  Anesthesia: local infiltration  Local anesthetic: lidocaine 1%  Anesthetic total: 3 ml  Irrigation method: syringe Amount of cleaning: standard  Skin closure: 6-0 absorbable  Number of sutures: 3  Technique: close  Patient tolerance: Patient tolerated the procedure well with no immediate complications.  LACERATION REPAIR Performed by: Gerhard Munch Authorized by: Gerhard Munch Consent: Verbal consent obtained. Risks and benefits: risks, benefits and alternatives were discussed Consent given by: patient Patient identity confirmed: provided demographic data Prepped and Draped in normal sterile fashion Wound explored  Laceration Location: lip, lower, midline  Laceration Length: 4cm  No Foreign Bodies seen or palpated  Anesthesia: local infiltration  Local anesthetic: lidocaine 1% no epinephrine  Anesthetic total: 4 ml  Irrigation method: syringe Amount of cleaning: standard  Skin closure: 5-0 absorbable  Number of sutures: 2  Technique: close - though the wound was rough, w areas of avulsion.  Patient tolerance: Patient tolerated the procedure well with no immediate complications.    Medications Ordered in ED Medications  lidocaine (PF) (XYLOCAINE) 1 % injection 30 mL (has no administration in time range)     Initial Impression / Assessment and Plan / ED Course  I have reviewed the triage vital signs and the nursing notes.  Pertinent labs & imaging results that were available during my care of the patient were reviewed by me and considered in my medical decision making (see chart for details).    Repeat exam patient is in similar condition. We reviewed all findings including  absence of evidence for fracture beyond nasal fracture. She is now accompanied by 2 female family members, aware of her condition. Patient consented for suture repair   Suture repair of both the brow/face lesion and lip lesion were performed without complication.  This elderly female presents after mechanical fall with multiple facial lacerations tube which required repair, left eyelid/nasal bridge, and the lip. Patient  has no broken teeth, no other extremity fractures, is awake, alert, moving all extremities spontaneously, hemodynamically unremarkable throughout her evaluation. Patient does have nasal fracture in addition to lacerations, and will follow-up with ENT.  On however, given the otherwise reassuring findings, absence of distress, absence of neurovascular compromise, she discharged in stable condition. Final Clinical Impressions(s) / ED Diagnoses   Final diagnoses:  Fall, initial encounter  Facial laceration, initial encounter     Gerhard Munch, MD 01/18/18 1644

## 2018-02-20 ENCOUNTER — Other Ambulatory Visit (HOSPITAL_COMMUNITY): Payer: Self-pay | Admitting: Family Medicine

## 2018-02-20 DIAGNOSIS — R55 Syncope and collapse: Secondary | ICD-10-CM

## 2018-02-27 ENCOUNTER — Ambulatory Visit (HOSPITAL_COMMUNITY)
Admission: RE | Admit: 2018-02-27 | Discharge: 2018-02-27 | Disposition: A | Discharge status: Home / Self Care | Payer: Medicare Other | Source: Ambulatory Visit | Attending: Family Medicine | Admitting: Family Medicine

## 2018-02-27 DIAGNOSIS — R55 Syncope and collapse: Secondary | ICD-10-CM

## 2018-02-27 NOTE — Progress Notes (Signed)
Carotid artery duplex has been completed. 1-39% ICA stenosis bilaterally.  02/27/18 9:38 AM Olen Cordial RVT

## 2018-03-12 ENCOUNTER — Encounter: Payer: Self-pay | Admitting: Internal Medicine

## 2018-03-31 ENCOUNTER — Other Ambulatory Visit: Payer: Self-pay | Admitting: Obstetrics & Gynecology

## 2018-03-31 ENCOUNTER — Ambulatory Visit: Payer: Medicare Other | Admitting: Obstetrics & Gynecology

## 2018-03-31 ENCOUNTER — Encounter: Payer: Self-pay | Admitting: Obstetrics & Gynecology

## 2018-03-31 VITALS — BP 128/82 | Ht 65.0 in | Wt 285.0 lb

## 2018-03-31 DIAGNOSIS — Z780 Asymptomatic menopausal state: Secondary | ICD-10-CM

## 2018-03-31 DIAGNOSIS — Z01419 Encounter for gynecological examination (general) (routine) without abnormal findings: Secondary | ICD-10-CM | POA: Diagnosis not present

## 2018-03-31 DIAGNOSIS — Z1231 Encounter for screening mammogram for malignant neoplasm of breast: Secondary | ICD-10-CM

## 2018-03-31 DIAGNOSIS — Z6841 Body Mass Index (BMI) 40.0 and over, adult: Secondary | ICD-10-CM

## 2018-03-31 NOTE — Progress Notes (Signed)
Ann Castillo Mar 20, 1961 [id number]   History:    57 y.o. [id number] Married  RP:  Established patient presenting for annual gyn exam   HPI: Menopause, well on no hormone replacement therapy.  Resolved right lower quadrant pain that she had in July.  No current pelvic pain.  No pain with intercourse.  Urine and bowel movements normal.  Breasts normal.  Body mass index 47.43.  Not regularly physically active.  Had a stressful year, with her daughter with mental illness having a baby and giving the baby to adoption.  Treated for Lyme disease.  Health labs with family physician.  Past medical history,surgical history, family history and social history were all reviewed and documented in the EPIC chart.  Gynecologic History Patient's last menstrual period was 12/26/2012. Contraception: post menopausal status Last Pap: 02/2015. Results were: Negative Last mammogram: 02/2017. Results were: Benign Bone Density: Never Colonoscopy: 2015  Obstetric History OB History  Gravida Para Term Preterm AB Living  5 3     1 3   SAB TAB Ectopic Multiple Live Births  1            # Outcome Date GA Lbr Len/2nd Weight Sex Delivery Anes PTL Lv  5 Gravida           4 SAB           3 Para           2 Para           1 Para              ROS: A ROS was performed and pertinent positives and negatives are included in the history.  GENERAL: No fevers or chills. HEENT: No change in vision, no earache, sore throat or sinus congestion. NECK: No pain or stiffness. CARDIOVASCULAR: No chest pain or pressure. No palpitations. PULMONARY: No shortness of breath, cough or wheeze. GASTROINTESTINAL: No abdominal pain, nausea, vomiting or diarrhea, melena or bright red blood per rectum. GENITOURINARY: No urinary frequency, urgency, hesitancy or dysuria. MUSCULOSKELETAL: No joint or muscle pain, no back pain, no recent trauma. DERMATOLOGIC: No rash, no itching, no lesions. ENDOCRINE: No polyuria, polydipsia, no heat or  cold intolerance. No recent change in weight. HEMATOLOGICAL: No anemia or easy bruising or bleeding. NEUROLOGIC: No headache, seizures, numbness, tingling or weakness. PSYCHIATRIC: No depression, no loss of interest in normal activity or change in sleep pattern.     Exam:   BP 128/82   Ht 5\' 5"  (1.651 m)   Wt 285 lb (129.3 kg)   LMP 12/26/2012   BMI 47.43 kg/m   Body mass index is 47.43 kg/m.  General appearance : Well developed well nourished female. No acute distress HEENT: Eyes: no retinal hemorrhage or exudates,  Neck supple, trachea midline, no carotid bruits, no thyroidmegaly Lungs: Clear to auscultation, no rhonchi or wheezes, or rib retractions  Heart: Regular rate and rhythm, no murmurs or gallops Breast:Examined in sitting and supine position were symmetrical in appearance, no palpable masses or tenderness,  no skin retraction, no nipple inversion, no nipple discharge, no skin discoloration, no axillary or supraclavicular lymphadenopathy Abdomen: no palpable masses or tenderness, no rebound or guarding Extremities: no edema or skin discoloration or tenderness  Pelvic: Vulva: Normal             Vagina: No gross lesions or discharge  Cervix: No gross lesions or discharge.  Pap reflex done  Uterus  AV, normal size, shape and consistency, non-tender  and mobile  Adnexa  Without masses or tenderness  Anus: Normal   Assessment/Plan:  57 y.o. female for annual exam   1. Encounter for routine gynecological examination with Papanicolaou smear of cervix Normal gynecologic exam.  Pap reflex done today.  Breast exam normal.  Will schedule a screening mammogram now.  Health labs with family physician.  2. Postmenopausal Well on no HRT.  No postmenopausal bleeding.    3. Class 3 severe obesity due to excess calories without serious comorbidity with body mass index (BMI) of 45.0 to 49.9 in adult [facility]) Increased weight since last annual gynecologic exam.  Recommend low calorie/low  carb diet such as [employer].  Aerobic physical activities 5 times a week and weightlifting every 2 days.  Genia Del MD, 2:29 PM 03/31/2018

## 2018-04-01 LAB — PAP IG W/ RFLX HPV ASCU

## 2018-04-07 ENCOUNTER — Encounter: Payer: Self-pay | Admitting: Obstetrics & Gynecology

## 2018-04-07 NOTE — Patient Instructions (Signed)
1. Encounter for routine gynecological examination with Papanicolaou smear of cervix Normal gynecologic exam.  Pap reflex done today.  Breast exam normal.  Will schedule a screening mammogram now.  Health labs with family physician.  2. Postmenopausal Well on no HRT.  No postmenopausal bleeding.    3. Class 3 severe obesity due to excess calories without serious comorbidity with body mass index (BMI) of 45.0 to 49.9 in adult [facility]) Increased weight since last annual gynecologic exam.  Recommend low calorie/low carb diet such as [employer].  Aerobic physical activities 5 times a week and weightlifting every 2 days.  Ann Castillo, it was a pleasure seeing you today!  I will inform you of your results as soon as they are available.

## 2018-05-12 ENCOUNTER — Ambulatory Visit
Admission: RE | Admit: 2018-05-12 | Discharge: 2018-05-12 | Disposition: A | Discharge status: Home / Self Care | Payer: Medicare Other | Source: Ambulatory Visit | Attending: Obstetrics & Gynecology | Admitting: Obstetrics & Gynecology

## 2018-05-12 DIAGNOSIS — Z1231 Encounter for screening mammogram for malignant neoplasm of breast: Secondary | ICD-10-CM

## 2018-06-09 ENCOUNTER — Ambulatory Visit (INDEPENDENT_AMBULATORY_CARE_PROVIDER_SITE_OTHER): Payer: Medicare Other | Admitting: Physician Assistant

## 2018-06-09 ENCOUNTER — Encounter: Payer: Self-pay | Admitting: Physician Assistant

## 2018-06-09 DIAGNOSIS — F909 Attention-deficit hyperactivity disorder, unspecified type: Secondary | ICD-10-CM | POA: Diagnosis not present

## 2018-06-09 DIAGNOSIS — F331 Major depressive disorder, recurrent, moderate: Secondary | ICD-10-CM

## 2018-06-09 MED ORDER — ESCITALOPRAM OXALATE 20 MG PO TABS: 20.0000 mg | ORAL_TABLET | Freq: Every day | ORAL | 1 refills | Status: DC

## 2018-06-09 MED ORDER — ESCITALOPRAM OXALATE 10 MG PO TABS: 10.0000 mg | ORAL_TABLET | Freq: Every day | ORAL | 1 refills | Status: DC

## 2018-06-09 NOTE — Progress Notes (Signed)
[facility]  Patient ID: Ann Castillo,  MRN: [id number]  PCP: [place], Sarah T, MD  Date of Evaluation: 06/10/2018 Time spent:15 minutes  Chief Complaint:  Chief Complaint    Follow-up      HISTORY/CURRENT STATUS: HPI Here for routine med check.  Since LOV, her son moved back in after losing his job, her husband lost his job, her dtr who has mild retardation, and they didn't know she had a boyfriend found out she was 6 month pregnant.  The dtr, "passed down her genetic abnormality to her child"  She was 2 months early and had pre-eclampsia and they almost lost her.  She had to have an emergency CSection when she went into full blown eclampsia.  Pt had to fight to have the baby be put up for adoption b/c the father said he was going to give up the baby to someone he knows.  Those people had no ins and weren't financially stable, so she got them to go through an agency.  With all that is been going on she has been more anxious but it is mostly controlled by breathing exercises and other techniques.  She somehow forgot to take the extra 10 mg of Lexapro =30mg .  She doesn't know how long she was on the 20 mg only.  But when she realized it, she started it back and feels better now.  Patient denies loss of interest in usual activities and is able to enjoy things.  Denies decreased energy or motivation.  Appetite has not changed.  No of hopelessness.  Denies any changes in concentration, making decisions or remembering things.  Denies suicidal or homicidal thoughts. She has sadness but it's the circumstances.   She had neurologic testing 03/12/2017 at [facility] neurology by [facility].  Anxiety and depression were primary diagnoses.  Individual Medical History/ Review of Systems: Changes? :No    Past medications for mental health diagnoses include: Lexapro, trazodone, loxapine, Ambien, Klonopin, melatonin has not helped   Allergies: Celecoxib; Codeine phosphate;  Dilaudid [hydromorphone hcl]; Estrogen; Latex; Pravastatin sodium; and Rosuvastatin  Current Medications:  Current Outpatient Medications:  .  AMBULATORY NON FORMULARY MEDICATION, Medication Name: Kidney Factors take one tablet by mouth three times a day with meals, Disp: , Rfl:  .  Barberry-Oreg Grape-Goldenseal (BERBERINE COMPLEX PO), Take 450 mg by mouth 2 (two) times daily., Disp: , Rfl:  .  Cholecalciferol (VITAMIN D3 PO), Take 5,000 Units by mouth 2 (two) times daily., Disp: , Rfl:  .  escitalopram (LEXAPRO) 20 MG tablet, Take 1 tablet (20 mg total) by mouth daily. Add to 10mg =30mg ., Disp: 90 tablet, Rfl: 1 .  lisinopril-hydrochlorothiazide (PRINZIDE,ZESTORETIC) 10-12.5 MG per tablet, Take 1 tablet by mouth daily. , Disp: , Rfl:  .  magnesium gluconate (MAGONATE) 500 MG tablet, Take 500 mg by mouth daily., Disp: , Rfl:  .  meloxicam (MOBIC) 15 MG tablet, Take 15 mg by mouth daily as needed for pain. , Disp: , Rfl:  .  Milk Thistle 150 MG CAPS, Take 150 mg by mouth 2 (two) times daily., Disp: , Rfl:  .  OVER THE COUNTER MEDICATION, Take 500 mg by mouth 2 (two) times daily. N-A-C, Disp: , Rfl:  .  OVER THE COUNTER MEDICATION, Take 300 mg by mouth 2 (two) times daily. Tumeric, Disp: , Rfl:  .  UNABLE TO FIND, 10 mg as needed. Med Name: CBD oil, Disp: , Rfl:  .  vitamin B-12 (CYANOCOBALAMIN) 100 MCG  tablet, Take 100 mcg by mouth daily., Disp: , Rfl:  .  escitalopram (LEXAPRO) 10 MG tablet, Take 1 tablet (10 mg total) by mouth daily. Add to 20mg =30mg ., Disp: 90 tablet, Rfl: 1 Medication Side Effects: none  Family Medical/ Social History: Changes? Yes see above  MENTAL HEALTH EXAM:  Last menstrual period 12/26/2012.There is no height or weight on file to calculate BMI.  General Appearance: Casual, Well Groomed and Obese  Eye Contact:  Good  Speech:  Clear and Coherent with clipped speech  Volume:  Normal  Mood:  Euthymic  Affect:  Appropriate  Thought Process:  Goal Directed   Orientation:  Full (Time, Place, and Person)  Thought Content: Logical   Suicidal Thoughts:  No  Homicidal Thoughts:  No  Memory:  WNL  Judgement:  Good  Insight:  Good  Psychomotor Activity:  Normal  Concentration:  Concentration: Good  Recall:  Good  Fund of Knowledge: Good  Language: Good  Assets:  Desire for Improvement  ADL's:  Intact  Cognition: WNL  Prognosis:  Good    DIAGNOSES:    ICD-10-CM   1. Major depressive disorder, recurrent episode, moderate (HCC) F33.1   2. Attention deficit hyperactivity disorder (ADHD), unspecified ADHD type F90.9     Receiving Psychotherapy: Yes    RECOMMENDATIONS: Increase Lexapro to 30 mg as discussed at last visit.  She verbalizes understanding. Return in 6 months or sooner as needed.   Melony Overlyeresa Maybree Riling, PA-C

## 2018-09-25 ENCOUNTER — Other Ambulatory Visit: Payer: Self-pay

## 2018-09-25 MED ORDER — ESCITALOPRAM OXALATE 10 MG PO TABS: 10.0000 mg | ORAL_TABLET | Freq: Every day | ORAL | 1 refills | Status: DC

## 2018-09-25 MED ORDER — ESCITALOPRAM OXALATE 20 MG PO TABS: 20.0000 mg | ORAL_TABLET | Freq: Every day | ORAL | 1 refills | Status: DC

## 2018-11-27 ENCOUNTER — Other Ambulatory Visit: Payer: Self-pay | Admitting: Physician Assistant

## 2018-12-08 ENCOUNTER — Ambulatory Visit (INDEPENDENT_AMBULATORY_CARE_PROVIDER_SITE_OTHER): Payer: Medicare Other | Admitting: Physician Assistant

## 2018-12-08 ENCOUNTER — Other Ambulatory Visit: Payer: Self-pay

## 2018-12-08 ENCOUNTER — Encounter: Payer: Self-pay | Admitting: Physician Assistant

## 2018-12-08 DIAGNOSIS — F331 Major depressive disorder, recurrent, moderate: Secondary | ICD-10-CM

## 2018-12-08 NOTE — Progress Notes (Signed)
[facility]  Patient ID: Ann Castillo,  MRN: [id number]  PCP: [place], Sarah T, MD  Date of Evaluation: 12/08/2018 Time spent:15 minutes  Chief Complaint:  Chief Complaint    Follow-up     Virtual Visit via Telephone Note  I connected with patient by a video enabled telemedicine application or telephone, with their informed consent, and verified patient privacy and that I am speaking with the correct person using two identifiers.  I am private, in my home and the patient is home.   I discussed the limitations, risks, security and privacy concerns of performing an evaluation and management service by telephone and the availability of in person appointments. I also discussed with the patient that there may be a patient responsible charge related to this service. The patient expressed understanding and agreed to proceed.   I discussed the assessment and treatment plan with the patient. The patient was provided an opportunity to ask questions and all were answered. The patient agreed with the plan and demonstrated an understanding of the instructions.   The patient was advised to call back or seek an in-person evaluation if the symptoms worsen or if the condition fails to improve as anticipated.  I provided 15 minutes of non-face-to-face time during this encounter.  HISTORY/CURRENT STATUS: HPI For routine 6 month med check.  Doing really well!  Except for the pandemic. Her husband is home all day working and that's been tough, but it's strengthened her marriage.  She likes to be around people more so it has been difficult not being social right now.  But she has learned to cope with it.  Feels that the Lexapro has helped her tremendously through all of this.  "I think with the medication and the therapy that I have every other week, I have been able to get through this just fine."  She sometimes gets anxious but not a routine thing.  Patient denies loss of interest in  usual activities and is able to enjoy things.  Denies decreased energy or motivation.  Appetite has not changed.  No extreme sadness, tearfulness, or feelings of hopelessness.   Denies suicidal or homicidal thoughts.  She sometimes has trouble with her mind wandering if she is trying to read or work on crafts.  She believes the reason for that is so much information about the pandemic, politics, etc., and she cannot get those things out of her head at times.  She has had to stop watching the news all the time which has helped.  Denies dizziness, syncope, seizures, numbness, tingling, tremor, tics, unsteady gait, slurred speech, confusion. Denies muscle or joint pain, stiffness, or dystonia.  Individual Medical History/ Review of Systems: Changes? :No    Past medications for mental health diagnoses include: Lexapro, trazodone, loxapine, Ambien, Klonopin, melatonin has not helped  Allergies: Celecoxib, Codeine phosphate, Dilaudid [hydromorphone hcl], Estrogen, Latex, Pravastatin sodium, and Rosuvastatin  Current Medications:  Current Outpatient Medications:  .  AMBULATORY NON FORMULARY MEDICATION, Medication Name: Kidney Factors take one tablet by mouth three times a day with meals, Disp: , Rfl:  .  Barberry-Oreg Grape-Goldenseal (BERBERINE COMPLEX PO), Take 450 mg by mouth 2 (two) times daily., Disp: , Rfl:  .  Cats Claw, Uncaria tomentosa, (CATS CLAW PO), Take by mouth., Disp: , Rfl:  .  escitalopram (LEXAPRO) 10 MG tablet, TAKE 1 TABLET BY MOUTH EVERY DAY WITH ESCITALOPRAM 20 MG FOR TOTAL DOSE OF 30 MG., Disp: 90 tablet, Rfl: 0 .  escitalopram (LEXAPRO)  20 MG tablet, TAKE 1 TABLET BY MOUTH DAILY WITH ESCITALOPRAM 10 MG FOR TOTAL DOSE OF 30 MG., Disp: 90 tablet, Rfl: 0 .  lisinopril-hydrochlorothiazide (PRINZIDE,ZESTORETIC) 10-12.5 MG per tablet, Take 1 tablet by mouth daily. , Disp: , Rfl:  .  magnesium gluconate (MAGONATE) 500 MG tablet, Take 500 mg by mouth daily., Disp: , Rfl:  .  meloxicam  (MOBIC) 15 MG tablet, Take 15 mg by mouth daily as needed for pain. , Disp: , Rfl:  .  Milk Thistle 150 MG CAPS, Take 150 mg by mouth 2 (two) times daily., Disp: , Rfl:  .  OVER THE COUNTER MEDICATION, Take 500 mg by mouth 2 (two) times daily. N-A-C, Disp: , Rfl:  .  UNABLE TO FIND, 10 mg as needed. Med Name: CBD oil, Disp: , Rfl:  .  vitamin B-12 (CYANOCOBALAMIN) 100 MCG tablet, Take 100 mcg by mouth daily., Disp: , Rfl:  .  Cholecalciferol (VITAMIN D3 PO), Take 5,000 Units by mouth 2 (two) times daily., Disp: , Rfl:  .  OVER THE COUNTER MEDICATION, Take 300 mg by mouth 2 (two) times daily. Tumeric, Disp: , Rfl:  Medication Side Effects: none  Family Medical/ Social History: Changes? No  MENTAL HEALTH EXAM:  Last menstrual period 12/26/2012.There is no height or weight on file to calculate BMI.  General Appearance: unable to assess  Eye Contact:  unable to assess  Speech:  Clear and Coherent  Volume:  Normal  Mood:  Euthymic  Affect:  unable to assess  Thought Process:  Goal Directed  Orientation:  Full (Time, Place, and Person)  Thought Content: Logical   Suicidal Thoughts:  No  Homicidal Thoughts:  No  Memory:  WNL  Judgement:  Good  Insight:  Good  Psychomotor Activity:  unable to assess  Concentration:  Concentration: Good  Recall:  Good  Fund of Knowledge: Good  Language: Good  Assets:  Desire for Improvement  ADL's:  Intact  Cognition: WNL  Prognosis:  Good    DIAGNOSES:    ICD-10-CM   1. Major depressive disorder, recurrent episode, moderate (HCC)  F33.1     Receiving Psychotherapy: Yes Herminio CommonsMargaret Veach   RECOMMENDATIONS:  Continue Lexapro 30 mg p.o. daily. Continue psychotherapy with Rolin BarryMargaret Veatch. Return in 6 months.  Melony Overlyeresa Hao Dion, PA-C   This record has been created using [employer] software.  Chart creation errors have been sought, but may not always have been located and corrected. Such creation errors do not reflect on the standard of medical care.

## 2019-05-28 ENCOUNTER — Other Ambulatory Visit: Payer: Self-pay | Admitting: Physician Assistant

## 2019-08-17 ENCOUNTER — Other Ambulatory Visit: Payer: Self-pay | Admitting: Psychiatry

## 2019-08-18 NOTE — Telephone Encounter (Signed)
Overdue for apt, last visit 11/2018

## 2019-09-02 ENCOUNTER — Other Ambulatory Visit: Payer: Self-pay | Admitting: Psychiatry

## 2019-09-03 NOTE — Telephone Encounter (Signed)
Last apt 11/2018 

## 2019-10-20 ENCOUNTER — Ambulatory Visit: Payer: Medicare Other | Admitting: Cardiology

## 2019-10-20 ENCOUNTER — Telehealth: Payer: Self-pay | Admitting: Radiology

## 2019-10-20 ENCOUNTER — Encounter: Payer: Self-pay | Admitting: Cardiology

## 2019-10-20 ENCOUNTER — Other Ambulatory Visit: Payer: Self-pay

## 2019-10-20 VITALS — BP 127/78 | HR 73 | Ht 64.0 in | Wt 274.4 lb

## 2019-10-20 DIAGNOSIS — R55 Syncope and collapse: Secondary | ICD-10-CM | POA: Diagnosis not present

## 2019-10-20 DIAGNOSIS — I10 Essential (primary) hypertension: Secondary | ICD-10-CM

## 2019-10-20 DIAGNOSIS — E669 Obesity, unspecified: Secondary | ICD-10-CM | POA: Diagnosis not present

## 2019-10-20 MED ORDER — LISINOPRIL 10 MG PO TABS
10.0000 mg | ORAL_TABLET | Freq: Every day | ORAL | 3 refills | Status: DC
Start: 2019-10-20 — End: 2019-10-20

## 2019-10-20 MED ORDER — LISINOPRIL 10 MG PO TABS
10.0000 mg | ORAL_TABLET | Freq: Every day | ORAL | 3 refills | Status: DC
Start: 2019-10-20 — End: 2019-11-16

## 2019-10-20 NOTE — Progress Notes (Signed)
Cardiology Consult Note    Date:  10/28/2019   ID:  Ann Castillo, DOB 1960/12/28, MRN [id number]  PCP:  [place], Sarah T, MD  Cardiologist:  Armanda Magic, MD   Chief Complaint  Patient presents with  . New Patient (Initial Visit)    frequent falls and syncope    History of Present Illness:  Ann Castillo is a 59 y.o. female who is being seen today for the evaluation of Syncope and HTN at the request of [place], Garlan Fair, MD.  This is a 59yo female with a hx of GERD, Obesity, HLD, HTN who is referred for evaluation of HTN and syncope. She apparently has been having frequent falls lately and feels lightheaded before she falls.    This usually occurs when she is walking and will fall flat on her face.  This has happened 4 times in the past 2 years and occurred last month.  She says that she has had frequent falls for years but recently has become more frequent.  Initially she says that it was due to tripping.  She also has a hx of syncope in the past but last time was 2 years ago.  She does occasionally trip over her dog but other times falls and does not remember falling.  She is usually aware of all her surrounding the whole time.  SHe denies any dizziness, nausea, diaphoresis or palpitations prior to her falls.  She tells me that once in the gym in 2019 she was standing at the gym and suddenly passed out and woke up in a chair.  She denies any chest pain but occasionally has some DOE that has been felt to be related to obesity in the past and is stable.  She has chronic LE edema that is stable as well.  SHe has a hx of carotid stenosis 1-39% in 2019.    Past Medical History:  Diagnosis Date  . Anemia, unspecified   . Anxiety   . Cavus deformity of foot, acquired   . Depression   . Dysthymic disorder   . Edema    bilateral  . Esophageal reflux   . Essential hypertension, benign   . Hx pulmonary embolism 2006   was on estrogen   . Insomnia, unspecified   . Major depressive  disorder, single episode, unspecified   . Migraine, unspecified, without mention of intractable migraine without mention of status migrainosus   . Myalgia and myositis, unspecified   . Obesity, unspecified   . Other and unspecified hyperlipidemia   . Polycystic ovaries   . Unspecified gastritis and gastroduodenitis without mention of hemorrhage     Past Surgical History:  Procedure Laterality Date  . no surgical history      Current Medications: Current Meds  Medication Sig  . Barberry-Oreg Grape-Goldenseal (BERBERINE COMPLEX PO) Take 500 mg by mouth as directed.  . Cholecalciferol (VITAMIN D3 PO) Take 5,000 Units by mouth 2 (two) times daily.  . Chromium Picolinate 500 MCG CAPS Take 500 mcg by mouth as directed.  . escitalopram (LEXAPRO) 20 MG tablet TAKE 1 TABLET BY MOUTH  DAILY , ADD TO 10 MG TO  EQUAL 30 MG  . magnesium gluconate (MAGONATE) 500 MG tablet Take 500 mg by mouth daily.  . meloxicam (MOBIC) 15 MG tablet Take 15 mg by mouth daily as needed for pain.   . Milk Thistle 150 MG CAPS Take 150 mg by mouth 2 (two) times daily.  . niacin 500 MG tablet  Take 500 mg by mouth at bedtime.  Marland Kitchen OVER THE COUNTER MEDICATION Source of life gold tablets  . OVER THE COUNTER MEDICATION adaptocrine  . OVER THE COUNTER MEDICATION GENTLE MOVE STOOL SOFTENER BLEND  . OVER THE COUNTER MEDICATION L Glutamine 750 mg  . OVER THE COUNTER MEDICATION Digestive enzyme  . OVER THE COUNTER MEDICATION MONOLAURIN 500 MG  . OVER THE COUNTER MEDICATION Joint binder  . UNABLE TO FIND 10 mg as needed. Med Name: CBD oil  . vitamin B-12 (CYANOCOBALAMIN) 100 MCG tablet Take 100 mcg by mouth daily.  . [DISCONTINUED] escitalopram (LEXAPRO) 10 MG tablet TAKE 1 TABLET BY MOUTH  DAILY , ADD TO 20 MG TO  EQUAL 30 MG  . [DISCONTINUED] lisinopril-hydrochlorothiazide (PRINZIDE,ZESTORETIC) 10-12.5 MG per tablet Take 1 tablet by mouth daily.     Allergies:   Celecoxib, Codeine, Estrogenic substance, Hydromorphone hcl,  Latex, Pravastatin sodium, Rosuvastatin, Codeine phosphate, Dilaudid [hydromorphone hcl], Estrogen, and Pravastatin sodium   Social History   Socioeconomic History  . Marital status: Married    Spouse name: Not on file  . Number of children: 3  . Years of education: College  . Highest education level: Not on file  Occupational History  . Occupation: PARALEGAL    Employer: [employer]  Tobacco Use  . Smoking status: Never Smoker  . Smokeless tobacco: Never Used  Substance and Sexual Activity  . Alcohol use: Yes    Alcohol/week: 0.5 standard drinks    Types: 1 Glasses of wine per week    Comment: per few weeks  . Drug use: No  . Sexual activity: Not Currently    Comment: 1st intercourse- 20, partners- 1, married- 38 yr s  Other Topics Concern  . Not on file  Social History Narrative   Married, works as a IT consultant.  She has significant stress from daughter, who is mentally ill and lives at home (age 40).     Caffeine: very rarely    Social Determinants of Corporate investment banker Strain:   . Difficulty of Paying Living Expenses:   Food Insecurity:   . Worried About Programme researcher, broadcasting/film/video in the Last Year:   . Barista in the Last Year:   Transportation Needs:   . Freight forwarder (Medical):   Marland Kitchen Lack of Transportation (Non-Medical):   Physical Activity:   . Days of Exercise per Week:   . Minutes of Exercise per Session:   Stress:   . Feeling of Stress :   Social Connections:   . Frequency of Communication with Friends and Family:   . Frequency of Social Gatherings with Friends and Family:   . Attends Religious Services:   . Active Member of Clubs or Organizations:   . Attends Banker Meetings:   Marland Kitchen Marital Status:      Family History:  The patient's family history includes ADD / ADHD in her daughter and daughter; Alzheimer's disease in her maternal uncle, mother, and paternal grandfather; Breast cancer in her maternal aunt and  maternal grandmother; Diabetes in her father; Gallbladder disease in her father; Heart attack in her father; Heart disease in her father; Hypertension in her son; Learning disabilities in her daughter; Liver cancer in her father; Miscarriages / Stillbirths in her mother; Pancreatic cancer in her father; Personality disorder in her daughter; Tourette syndrome in her son.   ROS:   Please see the history of present illness.    ROS All other systems  reviewed and are negative.  No flowsheet data found.   PHYSICAL EXAM:   VS:  BP 127/78   Pulse 73   Ht 5\' 4"  (1.626 m)   Wt 274 lb 6.4 oz (124.5 kg)   LMP 12/26/2012   BMI 47.10 kg/m    GEN: Well nourished, well developed, in no acute distress  HEENT: normal  Neck: no JVD, carotid bruits, or masses Cardiac: RRR; no murmurs, rubs, or gallops,no edema.  Intact distal pulses bilaterally.  Respiratory:  clear to auscultation bilaterally, normal work of breathing GI: soft, nontender, nondistended, + BS MS: no deformity or atrophy  Skin: warm and dry, no rash Neuro:  Alert and Oriented x 3, Strength and sensation are intact Psych: euthymic mood, full affect  Wt Readings from Last 3 Encounters:  10/20/19 274 lb 6.4 oz (124.5 kg)  03/31/18 285 lb (129.3 kg)  01/18/18 275 lb (124.7 kg)      Studies/Labs Reviewed:   EKG:  EKG is ordered today.  The ekg ordered today demonstrates NSR with no ST changes and normal intervals  Recent Labs: No results found for requested labs within last 8760 hours.    Lipid Panel    Component Value Date/Time   CHOL 260 (H) 07/16/2011 1023   TRIG 138 07/16/2011 1023   HDL 62 07/16/2011 1023   CHOLHDL 4.2 07/16/2011 1023   VLDL 28 07/16/2011 1023   LDLCALC 170 (H) 07/16/2011 1023   LDLDIRECT 181.4 02/14/2009 0000    Additional studies/ records that were reviewed today include:  OV notes    ASSESSMENT:    1. Syncope and collapse      PLAN:  In order of problems listed  above:  1. Syncope -Her symptoms are not clear cut and cannot discern whether she is having true syncope -some of her falls are related to tripping while other times it sounds like presyncope or syncope -EKG is non ischemic and she denies any palpitations prior to the event -orthostatic BPs were normal but on the soft side as low as 673 mmHg systolic -I will stop diuretic part of Lisinopril HCT and continue on Lisinipril 10mg  daily -check 2D echo to assess LVF -check event monitor to evaluate for arrhythmias -check carotid dopplers -followup with PA in 4 weeks  2.  HTN -BP controlled  -given dizziness, will stop diuretic portion of Lisinopril HCT  3.  Obesity -I have encouraged her to get into a routine exercise program and cut back on carbs and portions.    Medication Adjustments/Labs and Tests Ordered: Current medicines are reviewed at length with the patient today.  Concerns regarding medicines are outlined above.  Medication changes, Labs and Tests ordered today are listed in the Patient Instructions below.  Patient Instructions  Medication Instructions:  Your physician has recommended you make the following change in your medication:  1) STOP taking lisinopril-HCTZ 2) START taking lisiniopril 10 mg daily  *If you need a refill on your cardiac medications before your next appointment, please call your pharmacy*  Testing/Procedures: Your physician has requested that you have an echocardiogram. Echocardiography is a painless test that uses sound waves to create images of your heart. It provides your doctor with information about the size and shape of your heart and how well your heart's chambers and valves are working. This procedure takes approximately one hour. There are no restrictions for this procedure.  Your physician has requested that you have a carotid duplex. This test is an ultrasound of the  carotid arteries in your neck. It looks at blood flow through these arteries  that supply the brain with blood. Allow one hour for this exam. There are no restrictions or special instructions.  Your physician has recommended that you wear an event monitor. Event monitors are medical devices that record the heart's electrical activity. Doctors most often [place] these monitors to diagnose arrhythmias. Arrhythmias are problems with the speed or rhythm of the heartbeat. The monitor is a small, portable device. You can wear one while you do your normal daily activities. This is usually used to diagnose what is causing palpitations/syncope (passing out).   Follow-Up: At [facility], you and your health needs are our priority.  As part of our continuing mission to provide you with exceptional heart care, we have created designated Provider Care Teams.  These Care Teams include your primary Cardiologist (physician) and Advanced Practice Providers (APPs -  Physician Assistants and Nurse Practitioners) who all work together to provide you with the care you need, when you need it.  We recommend signing up for the patient portal called "MyChart".  Sign up information is provided on this After Visit Summary.  MyChart is used to connect with patients for Virtual Visits (Telemedicine).  Patients are able to view lab/test results, encounter notes, upcoming appointments, etc.  Non-urgent messages can be sent to your provider as well.   To learn more about what you can do with MyChart, go to [email or web address].    Your next appointment:   4 week(s)  The format for your next appointment:   In Person  Provider:   Ronie Spies, PA-C or Jacolyn Reedy, PA-C       Signed, Armanda Magic, MD  10/28/2019 3:07 PM    [facility] [address] [place], [place], [place]  [address] Phone: [phone]; Fax: [phone]

## 2019-10-20 NOTE — Telephone Encounter (Signed)
Enrolled patient for a 30 day [employer] Event monitor to be mailed to patients home.  

## 2019-10-20 NOTE — Patient Instructions (Signed)
Medication Instructions:  Your physician has recommended you make the following change in your medication:  1) STOP taking lisinopril-HCTZ 2) START taking lisiniopril 10 mg daily  *If you need a refill on your cardiac medications before your next appointment, please call your pharmacy*  Testing/Procedures: Your physician has requested that you have an echocardiogram. Echocardiography is a painless test that uses sound waves to create images of your heart. It provides your doctor with information about the size and shape of your heart and how well your heart's chambers and valves are working. This procedure takes approximately one hour. There are no restrictions for this procedure.  Your physician has requested that you have a carotid duplex. This test is an ultrasound of the carotid arteries in your neck. It looks at blood flow through these arteries that supply the brain with blood. Allow one hour for this exam. There are no restrictions or special instructions.  Your physician has recommended that you wear an event monitor. Event monitors are medical devices that record the heart's electrical activity. Doctors most often [place] these monitors to diagnose arrhythmias. Arrhythmias are problems with the speed or rhythm of the heartbeat. The monitor is a small, portable device. You can wear one while you do your normal daily activities. This is usually used to diagnose what is causing palpitations/syncope (passing out).   Follow-Up: At [facility], you and your health needs are our priority.  As part of our continuing mission to provide you with exceptional heart care, we have created designated Provider Care Teams.  These Care Teams include your primary Cardiologist (physician) and Advanced Practice Providers (APPs -  Physician Assistants and Nurse Practitioners) who all work together to provide you with the care you need, when you need it.  We recommend signing up for the patient portal called  "MyChart".  Sign up information is provided on this After Visit Summary.  MyChart is used to connect with patients for Virtual Visits (Telemedicine).  Patients are able to view lab/test results, encounter notes, upcoming appointments, etc.  Non-urgent messages can be sent to your provider as well.   To learn more about what you can do with MyChart, go to [email or web address].    Your next appointment:   4 week(s)  The format for your next appointment:   In Person  Provider:   Ronie Spies, PA-C or Jacolyn Reedy, PA-C

## 2019-10-25 ENCOUNTER — Other Ambulatory Visit: Payer: Self-pay | Admitting: Physician Assistant

## 2019-10-27 NOTE — Telephone Encounter (Signed)
Last apt 11/2018, was due at 6 months

## 2019-10-28 ENCOUNTER — Encounter: Payer: Self-pay | Admitting: Obstetrics & Gynecology

## 2019-11-03 ENCOUNTER — Encounter (INDEPENDENT_AMBULATORY_CARE_PROVIDER_SITE_OTHER): Payer: Medicare Other

## 2019-11-03 DIAGNOSIS — R55 Syncope and collapse: Secondary | ICD-10-CM | POA: Diagnosis not present

## 2019-11-11 ENCOUNTER — Other Ambulatory Visit: Payer: Self-pay

## 2019-11-11 ENCOUNTER — Ambulatory Visit (HOSPITAL_BASED_OUTPATIENT_CLINIC_OR_DEPARTMENT_OTHER): Payer: Medicare Other

## 2019-11-11 ENCOUNTER — Ambulatory Visit (HOSPITAL_COMMUNITY)
Admission: RE | Admit: 2019-11-11 | Discharge: 2019-11-11 | Disposition: A | Discharge status: Home / Self Care | Payer: Medicare Other | Source: Ambulatory Visit | Attending: Cardiovascular Disease | Admitting: Cardiovascular Disease

## 2019-11-11 ENCOUNTER — Other Ambulatory Visit: Payer: Self-pay | Admitting: Orthopaedic Surgery

## 2019-11-11 ENCOUNTER — Encounter: Payer: Self-pay | Admitting: Cardiology

## 2019-11-11 DIAGNOSIS — M79671 Pain in right foot: Secondary | ICD-10-CM

## 2019-11-11 DIAGNOSIS — R55 Syncope and collapse: Secondary | ICD-10-CM | POA: Diagnosis present

## 2019-11-11 DIAGNOSIS — I6521 Occlusion and stenosis of right carotid artery: Secondary | ICD-10-CM | POA: Insufficient documentation

## 2019-11-16 ENCOUNTER — Encounter: Payer: Self-pay | Admitting: Cardiology

## 2019-11-16 ENCOUNTER — Ambulatory Visit: Payer: Medicare Other | Admitting: Cardiology

## 2019-11-16 ENCOUNTER — Telehealth: Payer: Self-pay | Admitting: *Deleted

## 2019-11-16 ENCOUNTER — Other Ambulatory Visit: Payer: Self-pay

## 2019-11-16 VITALS — BP 120/72 | HR 68 | Ht 64.0 in | Wt 274.0 lb

## 2019-11-16 DIAGNOSIS — I6521 Occlusion and stenosis of right carotid artery: Secondary | ICD-10-CM | POA: Diagnosis not present

## 2019-11-16 DIAGNOSIS — E7800 Pure hypercholesterolemia, unspecified: Secondary | ICD-10-CM | POA: Diagnosis not present

## 2019-11-16 DIAGNOSIS — I10 Essential (primary) hypertension: Secondary | ICD-10-CM

## 2019-11-16 DIAGNOSIS — R55 Syncope and collapse: Secondary | ICD-10-CM | POA: Diagnosis not present

## 2019-11-16 MED ORDER — LISINOPRIL 10 MG PO TABS: 10.0000 mg | ORAL_TABLET | Freq: Every day | ORAL | 3 refills | Status: DC

## 2019-11-16 NOTE — Progress Notes (Addendum)
Cardiology  Note    Date:  11/16/2019   ID:  Ann Castillo, Ann Castillo November 27, 1960, MRN [id number]  PCP:  Martinique, Sarah T, MD  Cardiologist:  Fransico Him, MD   Chief Complaint  Patient presents with  . Follow-up    Syncope    History of Present Illness:   This is a 59yo female with a hx of GERD, Obesity, HLD, HTN who was referred for evaluation of HTN and syncope. She apparently has been having frequent falls and felt lightheaded before she falls.  This would usually occur when she was walking and would fall flat on her face.  This has happened 4 times in the past 2 years and 2 months ago.  She says that she has had frequent falls for years but recently has become more frequent.  Initially she says that it was due to tripping.  She also has a hx of syncope in the past but last time was 2 years ago.  She does occasionally trip over her dog but other times falls and does not remember falling.  She is usually aware of all her surrounding the whole time.  She said that once in the gym in 2019 she was standing at the gym and suddenly passed out and woke up in a chair.    2D echo showed normal LVF with EF 55-60%, mild RVE with normal RVF, trivial MR and no AS.  Carotid dopplers showed 1-39% right carotid stenosis. She has been wearing a heart monitor but do not have results back yet.  She is back today for followup.  She denies any chest pain or pressure, SOB, DOE, LE edema, palpitations or syncope.   Past Medical History:  Diagnosis Date  . Anemia, unspecified   . Anxiety   . Carotid stenosis, right    1-39% by doppler 10/2019  . Cavus deformity of foot, acquired   . Depression   . Dysthymic disorder   . Edema    bilateral  . Esophageal reflux   . Essential hypertension, benign   . Hx pulmonary embolism 2006   was on estrogen   . Insomnia, unspecified   . Major depressive disorder, single episode, unspecified   . Migraine, unspecified, without mention of intractable migraine without  mention of status migrainosus   . Myalgia and myositis, unspecified   . Obesity, unspecified   . Other and unspecified hyperlipidemia   . Polycystic ovaries   . Unspecified gastritis and gastroduodenitis without mention of hemorrhage     Past Surgical History:  Procedure Laterality Date  . no surgical history      Current Medications: Current Meds  Medication Sig  . Barberry-Oreg Grape-Goldenseal (BERBERINE COMPLEX PO) Take 500 mg by mouth as directed.  . Cholecalciferol (VITAMIN D3 PO) Take 5,000 Units by mouth 2 (two) times daily.  . Chromium Picolinate 500 MCG CAPS Take 500 mcg by mouth as directed.  . escitalopram (LEXAPRO) 10 MG tablet TAKE 1 TABLET BY MOUTH  DAILY ADD TO 20 MG TO EQUAL 30 MG  . escitalopram (LEXAPRO) 20 MG tablet TAKE 1 TABLET BY MOUTH  DAILY , ADD TO 10 MG TO  EQUAL 30 MG  . lisinopril (ZESTRIL) 10 MG tablet Take 1 tablet (10 mg total) by mouth daily.  . magnesium gluconate (MAGONATE) 500 MG tablet Take 500 mg by mouth daily.  . meloxicam (MOBIC) 15 MG tablet Take 15 mg by mouth daily as needed for pain.   . Milk Thistle 150 MG  CAPS Take 150 mg by mouth 2 (two) times daily.  . niacin 500 MG tablet Take 500 mg by mouth at bedtime.  Marland Kitchen OVER THE COUNTER MEDICATION Source of life gold tablets  . OVER THE COUNTER MEDICATION adaptocrine  . OVER THE COUNTER MEDICATION GENTLE MOVE STOOL SOFTENER BLEND  . OVER THE COUNTER MEDICATION L Glutamine 750 mg  . OVER THE COUNTER MEDICATION Digestive enzyme  . OVER THE COUNTER MEDICATION MONOLAURIN 500 MG  . OVER THE COUNTER MEDICATION Joint binder  . UNABLE TO FIND 10 mg as needed. Med Name: CBD oil  . vitamin B-12 (CYANOCOBALAMIN) 100 MCG tablet Take 100 mcg by mouth daily.  . [DISCONTINUED] lisinopril (ZESTRIL) 10 MG tablet Take 1 tablet (10 mg total) by mouth daily.    Allergies:   Celecoxib, Codeine, Estrogenic substance, Hydromorphone hcl, Latex, Pravastatin sodium, Rosuvastatin, Codeine phosphate, Dilaudid  [hydromorphone hcl], Estrogen, and Pravastatin sodium   Social History   Socioeconomic History  . Marital status: Married    Spouse name: Not on file  . Number of children: 3  . Years of education: College  . Highest education level: Not on file  Occupational History  . Occupation: PARALEGAL    Employer: [employer]  Tobacco Use  . Smoking status: Never Smoker  . Smokeless tobacco: Never Used  Vaping Use  . Vaping Use: Never used  Substance and Sexual Activity  . Alcohol use: Yes    Alcohol/week: 0.5 standard drinks    Types: 1 Glasses of wine per week    Comment: per few weeks  . Drug use: No  . Sexual activity: Not Currently    Comment: 1st intercourse- 20, partners- 1, married- 38 yr s  Other Topics Concern  . Not on file  Social History Narrative   Married, works as a IT consultant.  She has significant stress from daughter, who is mentally ill and lives at home (age 42).     Caffeine: very rarely    Social Determinants of Corporate investment banker Strain:   . Difficulty of Paying Living Expenses:   Food Insecurity:   . Worried About Programme researcher, broadcasting/film/video in the Last Year:   . Barista in the Last Year:   Transportation Needs:   . Freight forwarder (Medical):   Marland Kitchen Lack of Transportation (Non-Medical):   Physical Activity:   . Days of Exercise per Week:   . Minutes of Exercise per Session:   Stress:   . Feeling of Stress :   Social Connections:   . Frequency of Communication with Friends and Family:   . Frequency of Social Gatherings with Friends and Family:   . Attends Religious Services:   . Active Member of Clubs or Organizations:   . Attends Banker Meetings:   Marland Kitchen Marital Status:      Family History:  The patient's family history includes ADD / ADHD in her daughter and daughter; Alzheimer's disease in her maternal uncle, mother, and paternal grandfather; Breast cancer in her maternal aunt and maternal grandmother; Diabetes in  her father; Gallbladder disease in her father; Heart attack in her father; Heart disease in her father; Hypertension in her son; Learning disabilities in her daughter; Liver cancer in her father; Miscarriages / Stillbirths in her mother; Pancreatic cancer in her father; Personality disorder in her daughter; Tourette syndrome in her son.   ROS:   Please see the history of present illness.    ROS All other  systems reviewed and are negative.  No flowsheet data found.   PHYSICAL EXAM:   VS:  BP 120/72   Pulse 68   Ht 5\' 4"  (1.626 m)   Wt 274 lb (124.3 kg)   LMP 12/26/2012   SpO2 97%   BMI 47.03 kg/m    GEN: Well nourished, well developed in no acute distress HEENT: Normal NECK: No JVD; No carotid bruits LYMPHATICS: No lymphadenopathy CARDIAC:RRR, no murmurs, rubs, gallops RESPIRATORY:  Clear to auscultation without rales, wheezing or rhonchi  ABDOMEN: Soft, non-tender, non-distended MUSCULOSKELETAL:  No edema; No deformity  SKIN: Warm and dry NEUROLOGIC:  Alert and oriented x 3 PSYCHIATRIC:  Normal affect    Wt Readings from Last 3 Encounters:  11/16/19 274 lb (124.3 kg)  10/20/19 274 lb 6.4 oz (124.5 kg)  03/31/18 285 lb (129.3 kg)      Studies/Labs Reviewed:   EKG:  EKG is not ordered today.   Recent Labs: No results found for requested labs within last 8760 hours.    Lipid Panel    Component Value Date/Time   CHOL 260 (H) 07/16/2011 1023   TRIG 138 07/16/2011 1023   HDL 62 07/16/2011 1023   CHOLHDL 4.2 07/16/2011 1023   VLDL 28 07/16/2011 1023   LDLCALC 170 (H) 07/16/2011 1023   LDLDIRECT 181.4 02/14/2009 0000    Additional studies/ records that were reviewed today include:  OV notes ASSESSMENT:    1. Syncope and collapse   2. Essential hypertension, benign   3. Carotid stenosis, right   4. Pure hypercholesterolemia      PLAN:  In order of problems listed above:  1.  Syncope -Her symptoms are not clear cut and cannot discern whether she is having  true syncope -some of her falls are related to tripping while other times it sounds like presyncope or syncope -EKG is non ischemic and she denies any palpitations prior to the event -orthostatic BPs were normal but on the soft side as low as 106 mmHg systolic at last OV and diuretic part of Lisinopril HCT was stopped and continued on Lisinipril 10mg  daily -2D echo with normal LVF and no valvular disease -event monitor pending -carotid dopplers with 1-39% right carotid stenosis -if event monitor shows no arrhythmias then refer to Dr. 02/16/2009 for ILR -her RV was mildly elevated so I will get a home sleep study to rule out OSA given that she says that she snores when sleeping.  I will also get a Chest CTA to rule out chronic thromboembolic dz due to hx of PE  2.  HTN -BP controlled -continue Lisinopril 10mg  daily  3.  Carotid stenosis -dopplers showed 1-39% right -repeat dopplers in 2 years -currently not on statin  4.  HLD -LDL goal < 70 due to carotid stenosis -LDL in April was 155 -she is statin intolerant -refer to lipid clinic  Medication Adjustments/Labs and Tests Ordered: Current medicines are reviewed at length with the patient today.  Concerns regarding medicines are outlined above.  Medication changes, Labs and Tests ordered today are listed in the Patient Instructions below.  There are no Patient Instructions on file for this visit.   Signed, Johney Frame, MD  11/16/2019 2:02 PM    [facility] [address] [place], [place], [address]  [place] Phone: [phone]; Fax: [phone]

## 2019-11-16 NOTE — Addendum Note (Signed)
Addended by: Theresia Majors on: 11/16/2019 02:11 PM   Modules accepted: Orders

## 2019-11-16 NOTE — Patient Instructions (Signed)
Medication Instructions:  Your physician recommends that you continue on your current medications as directed. Please refer to the Current Medication list given to you today.  *If you need a refill on your cardiac medications before your next appointment, please call your pharmacy*   Testing/Procedures: Your physician has recommended that you have a sleep study. This test records several body functions during sleep, including: brain activity, eye movement, oxygen and carbon dioxide blood levels, heart rate and rhythm, breathing rate and rhythm, the flow of air through your mouth and nose, snoring, body muscle movements, and chest and belly movement.  Your physician has recommended that you have a chest CT scan.  Follow-Up: At [facility], you and your health needs are our priority.  As part of our continuing mission to provide you with exceptional heart care, we have created designated Provider Care Teams.  These Care Teams include your primary Cardiologist (physician) and Advanced Practice Providers (APPs -  Physician Assistants and Nurse Practitioners) who all work together to provide you with the care you need, when you need it.  Your next appointment:   1 year(s)  The format for your next appointment:   In Person  Provider:   You may see Armanda Magic, MD or one of the following Advanced Practice Providers on your designated Care Team:    Ronie Spies, PA-C  Jacolyn Reedy, PA-C   Other Instructions You have been referred to see our PharmD in the Lipid Clinic.

## 2019-11-16 NOTE — Addendum Note (Signed)
Addended by: Theresia Majors on: 11/16/2019 02:58 PM   Modules accepted: Orders

## 2019-11-16 NOTE — Telephone Encounter (Signed)
-----   Message from Theresia Majors, RN sent at 11/16/2019  2:16 PM EDT ----- Home sleep study has been ordered.  Thanks!

## 2019-11-17 ENCOUNTER — Telehealth: Payer: Self-pay | Admitting: *Deleted

## 2019-11-17 ENCOUNTER — Other Ambulatory Visit: Payer: Medicare Other

## 2019-11-17 DIAGNOSIS — E7800 Pure hypercholesterolemia, unspecified: Secondary | ICD-10-CM

## 2019-11-17 DIAGNOSIS — I10 Essential (primary) hypertension: Secondary | ICD-10-CM

## 2019-11-17 LAB — BASIC METABOLIC PANEL
BUN/Creatinine Ratio: 26 — ABNORMAL HIGH (ref 9–23)
BUN: 17 mg/dL (ref 6–24)
CO2: 24 mmol/L (ref 20–29)
Calcium: 9.3 mg/dL (ref 8.7–10.2)
Chloride: 103 mmol/L (ref 96–106)
Creatinine, Ser: 0.65 mg/dL (ref 0.57–1.00)
GFR calc Af Amer: 114 mL/min/{1.73_m2} (ref >59)
GFR calc non Af Amer: 99 mL/min/{1.73_m2} (ref >59)
Glucose: 93 mg/dL (ref 65–99)
Potassium: 4.4 mmol/L (ref 3.5–5.2)
Sodium: 139 mmol/L (ref 134–144)

## 2019-11-17 NOTE — Telephone Encounter (Signed)
staff message sent to [facility] Ok to schedule HST. No PA is required. Patient has [employer].

## 2019-11-18 NOTE — Telephone Encounter (Signed)
Patient is aware and agreeable to Home Sleep Study through [facility]. Patient is scheduled for 8/10 at 10 am to pick up home sleep kit and meet with Respiratory therapist at [facility]. Patient is aware that if this appointment date and time does not work for them they should contact Artis Delay directly at [phone]. Patient is aware that a sleep packet will be sent from [facility] in week. Patient is agreeable to treatment and thankful for call.

## 2019-11-18 NOTE — Telephone Encounter (Signed)
Ann Castillo, CMA  Ann Castillo, CMA Ok to schedule HST. No PA is required. Patient has [employer].

## 2019-11-19 ENCOUNTER — Other Ambulatory Visit: Payer: Self-pay

## 2019-11-19 ENCOUNTER — Ambulatory Visit (INDEPENDENT_AMBULATORY_CARE_PROVIDER_SITE_OTHER)
Admission: RE | Admit: 2019-11-19 | Discharge: 2019-11-19 | Disposition: A | Discharge status: Home / Self Care | Payer: Medicare Other | Source: Ambulatory Visit | Attending: Cardiology | Admitting: Cardiology

## 2019-11-19 DIAGNOSIS — R55 Syncope and collapse: Secondary | ICD-10-CM

## 2019-11-19 MED ORDER — IOHEXOL 350 MG/ML SOLN
80.0000 mL | Freq: Once | INTRAVENOUS | Status: AC | PRN
  Administered 2019-11-19: 80 mL via INTRAVENOUS

## 2019-11-24 ENCOUNTER — Ambulatory Visit (INDEPENDENT_AMBULATORY_CARE_PROVIDER_SITE_OTHER): Payer: Medicare Other | Admitting: Pharmacist

## 2019-11-24 ENCOUNTER — Other Ambulatory Visit: Payer: Self-pay

## 2019-11-24 DIAGNOSIS — E7801 Familial hypercholesterolemia: Secondary | ICD-10-CM

## 2019-11-24 DIAGNOSIS — E7800 Pure hypercholesterolemia, unspecified: Secondary | ICD-10-CM

## 2019-11-24 DIAGNOSIS — G720 Drug-induced myopathy: Secondary | ICD-10-CM

## 2019-11-24 DIAGNOSIS — I10 Essential (primary) hypertension: Secondary | ICD-10-CM

## 2019-11-24 DIAGNOSIS — T466X5A Adverse effect of antihyperlipidemic and antiarteriosclerotic drugs, initial encounter: Secondary | ICD-10-CM

## 2019-11-24 MED ORDER — REPATHA SURECLICK 140 MG/ML ~~LOC~~ SOAJ: 1.0000 "pen " | SUBCUTANEOUS | 11 refills | Status: DC

## 2019-11-24 NOTE — Patient Instructions (Addendum)
It was nice to meet you today  Your LDL cholesterol is 155 and your goal is < 70 - I will submit paperwork for Repatha injections. They lower your LDL cholesterol by 60%, prevent heart attacks and strokes, reduce plaque build up, and are given into the fatty tissue of your abdomen or upper outer thigh every 14 days. You can let the medication sit out and warm up to room temperature for 30 minutes before injecting it  Your copay will likely be $97 for the first month (deductible), then $47 per month after  Recheck fasting labs on Monday, September 13th any time after 7:30am

## 2019-11-24 NOTE — Progress Notes (Addendum)
Patient ID: Ann Castillo                 DOB: 10-Sep-1960                    MRN: [id number]     HPI: Ann Castillo is a 59 y.o. female patient referred to lipid clinic by Dr Radford Pax. PMH is significant for obesity, HLD, HTN, syncope with history of passing out, PE in 2006 while on estrogen therapy, depression, morbid obesity, and GERD. 2D echo showed LVEF of 55-60%, carotid dopplers showed 1-39% right carotid stenosis. Event monitor, sleep study, and chest CT pending. Had Lyme disease 5 years ago and has suffered neurologic symptoms since including some memory loss.  Pt presents today in good spirits with her husband. She took 1 dose of niacin and discontinued due to flushing. Is currently taking red yeast rice. Previously tried pravastatin and rosuvastatin 10-15 years ago. She developed weakness in her legs, pain in her hips, and has suffered frequent falls since. Her father had his first MI in his 84s.  States she's allergic to the sulfites that are in epinephrine - has had racing HR at the dentist even with small doses. Confirmed there are no sulfites in PCSK9i.  Pt + for familial hypercholesterolemia with Ann Castillo Lipid score of 10. 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   Current Medications: red yeast rice Intolerances: pravastatin, rosuvastatin - myalgias, niacin - flushing Risk Factors: familial hypercholesterolemia, 1-39% right carotid stenosis, family history of CAD LDL goal: <41m/dL  Diet: Chicken, fish, rarely eats beef or pork. Likes vegetables, drinks water. Snacks frequently late at evening - usually sugary foods, chips. Struggles with portion control when snacking. Avoids sugary drinks.  Exercise: Walks 3 miles per day - currently has boot on foot. Was walking 5-7 miles daily before. Yoga, tai chi as well  Family History: The patient's family  history includes ADD / ADHD in her daughter and daughter; Alzheimer's disease in her maternal uncle, mother, and paternal grandfather; Breast cancer in her maternal aunt and maternal grandmother; Diabetes in her father; Gallbladder disease in her father; Heart attack in her father; Heart disease in her father; Hypertension in her son; Learning disabilities in her daughter; Liver cancer in her father; Miscarriages / Stillbirths in her mother; Pancreatic cancer in her father; Personality disorder in her daughter; Tourette syndrome in her son.   Social History: Occasional alcohol use, denies illicit drug use and tobacco use.  Labs: 09/11/19: TC 228, TG 115, HDL 52, LDL 155 (no lipid lowering therapy)  Past Medical History:  Diagnosis Date  . Anemia, unspecified   . Anxiety   . Carotid stenosis, right    1-39% by doppler 10/2019  . Cavus deformity of foot, acquired   . Depression   . Dysthymic disorder   . Edema    bilateral  . Esophageal reflux   . Essential hypertension, benign   . Hx pulmonary embolism 2006   was on estrogen   . Insomnia, unspecified   . Major depressive disorder, single episode, unspecified   . Migraine, unspecified, without mention of intractable migraine without mention of status migrainosus   . Myalgia and myositis, unspecified   . Obesity, unspecified   . Other and unspecified hyperlipidemia   . Polycystic ovaries   . Unspecified gastritis and gastroduodenitis without  mention of hemorrhage     Current Outpatient Medications on File Prior to Visit  Medication Sig Dispense Refill  . Barberry-Oreg Grape-Goldenseal (BERBERINE COMPLEX PO) Take 500 mg by mouth as directed.    . Cholecalciferol (VITAMIN D3 PO) Take 5,000 Units by mouth 2 (two) times daily.    . Chromium Picolinate 500 MCG CAPS Take 500 mcg by mouth as directed.    . escitalopram (LEXAPRO) 10 MG tablet TAKE 1 TABLET BY MOUTH  DAILY ADD TO 20 MG TO EQUAL 30 MG 30 tablet 0  . escitalopram (LEXAPRO) 20  MG tablet TAKE 1 TABLET BY MOUTH  DAILY , ADD TO 10 MG TO  EQUAL 30 MG 90 tablet 3  . lisinopril (ZESTRIL) 10 MG tablet Take 1 tablet (10 mg total) by mouth daily. 90 tablet 3  . magnesium gluconate (MAGONATE) 500 MG tablet Take 500 mg by mouth daily.    . meloxicam (MOBIC) 15 MG tablet Take 15 mg by mouth daily as needed for pain.     . Milk Thistle 150 MG CAPS Take 150 mg by mouth 2 (two) times daily.    . niacin 500 MG tablet Take 500 mg by mouth at bedtime.    Marland Kitchen OVER THE COUNTER MEDICATION Source of life gold tablets    . OVER THE COUNTER MEDICATION adaptocrine    . OVER THE COUNTER MEDICATION GENTLE MOVE STOOL SOFTENER BLEND    . OVER THE COUNTER MEDICATION L Glutamine 750 mg    . OVER THE COUNTER MEDICATION Digestive enzyme    . OVER THE COUNTER MEDICATION MONOLAURIN 500 MG    . OVER THE COUNTER MEDICATION Joint binder    . UNABLE TO FIND 10 mg as needed. Med Name: CBD oil    . vitamin B-12 (CYANOCOBALAMIN) 100 MCG tablet Take 100 mcg by mouth daily.     No current facility-administered medications on file prior to visit.    Allergies  Allergen Reactions  . Celecoxib Anaphylaxis    REACTION: ? anaphylactic reaction 10/11 - throat closing, hand itching 1 1/2 hrs after dose but had taken this previously without difficulty. REACTION: ? anaphylactic reaction 10/11 - throat closing, hand itching 1 1/2 hrs after dose but had taken this previously without difficulty.  . Codeine Other (See Comments)    REACTION: questionable: mental status changes  . Estrogenic Substance Other (See Comments)    Blood clots  . Hydromorphone Hcl Other (See Comments)    Pain  Increases.  blindness  . Latex Rash  . Pravastatin Sodium Other (See Comments)    REACTION: upper extremity muscle aches - ? anaphylactic reaction 3 days later 03/07/08  . Rosuvastatin Other (See Comments)    REACTION: myalgia REACTION: myalgia  . Codeine Phosphate     REACTION: questionable: mental status changes  . Dilaudid  [Hydromorphone Hcl]     Pain  Increases.  blindness  . Estrogen   . Pravastatin Sodium     REACTION: upper extremity muscle aches - ? anaphylactic reaction 3 days later 03/07/08    Assessment/Plan:  1. Hyperlipidemia - Baseline LDL 155 above goal < 70 due to CAD and HeFH (Dutch Lipid score of 10). Pt is intolerant to 2 statins and does not wish to rechallenge with another. Zetia will not lower LDL to goal. Discussed PCSK9i therapy including expected benefits, side effects, injection technique, and cost. Will start Repatha 181m Q2W, prior auth approved through 05/25/20. Follow up labs scheduled in Sept to assess efficacy.  Ann Castillo, PharmD, BCACP, [place] [address], [place], [place] [address] Phone: [phone]; Fax: [phone] 11/24/2019 12:26 PM

## 2019-12-08 ENCOUNTER — Other Ambulatory Visit: Payer: Self-pay | Admitting: Physician Assistant

## 2019-12-16 ENCOUNTER — Other Ambulatory Visit: Payer: Self-pay

## 2019-12-16 ENCOUNTER — Ambulatory Visit
Admission: RE | Admit: 2019-12-16 | Discharge: 2019-12-16 | Disposition: A | Discharge status: Home / Self Care | Payer: Medicare Other | Source: Ambulatory Visit | Attending: Orthopaedic Surgery | Admitting: Orthopaedic Surgery

## 2019-12-16 DIAGNOSIS — M79671 Pain in right foot: Secondary | ICD-10-CM

## 2020-01-05 ENCOUNTER — Ambulatory Visit (HOSPITAL_BASED_OUTPATIENT_CLINIC_OR_DEPARTMENT_OTHER): Payer: Medicare Other | Attending: Cardiology | Admitting: Cardiology

## 2020-01-05 ENCOUNTER — Other Ambulatory Visit: Payer: Self-pay

## 2020-01-05 DIAGNOSIS — G4734 Idiopathic sleep related nonobstructive alveolar hypoventilation: Secondary | ICD-10-CM | POA: Diagnosis not present

## 2020-01-05 DIAGNOSIS — R55 Syncope and collapse: Secondary | ICD-10-CM

## 2020-01-05 DIAGNOSIS — R0683 Snoring: Secondary | ICD-10-CM | POA: Insufficient documentation

## 2020-01-05 DIAGNOSIS — G4733 Obstructive sleep apnea (adult) (pediatric): Secondary | ICD-10-CM | POA: Diagnosis not present

## 2020-01-05 DIAGNOSIS — G4736 Sleep related hypoventilation in conditions classified elsewhere: Secondary | ICD-10-CM | POA: Insufficient documentation

## 2020-01-07 NOTE — Procedures (Signed)
\     Patient Name: Ann Castillo, Ann Castillo Date: 01/05/2020 Gender: Female D.O.B: [birth year] Age (years): 32 Referring Provider: Armanda Magic MD, ABSM Height (inches): 64 Interpreting Physician: Armanda Magic MD, ABSM Weight (lbs): 274 RPSGT: Latimer Sink BMI: 47 MRN: [id number] Neck Size: 14.50  CLINICAL INFORMATION Sleep Study Type: HST  Indication for sleep study: N/A  Epworth Sleepiness Score: 9  SLEEP STUDY TECHNIQUE A multi-channel overnight portable sleep study was performed. The channels recorded were: nasal airflow, thoracic respiratory movement, and oxygen saturation with a pulse oximetry. Snoring was also monitored.  MEDICATIONS Patient self administered medications include: N/A.  SLEEP ARCHITECTURE Patient was studied for 605.4 minutes. The sleep efficiency was 100.0 % and the patient was supine for 96.9%. The arousal index was 0.0 per hour.  RESPIRATORY PARAMETERS The overall AHI was 8.6 per hour, with a central apnea index of 0.0 per hour.  The oxygen nadir was 67% during sleep.  CARDIAC DATA Mean heart rate during sleep was 58.4 bpm.  IMPRESSIONS - Mild obstructive sleep apnea occurred during this study (AHI = 8.6/h). - No significant central sleep apnea occurred during this study (CAI = 0.0/h). - Severe oxygen desaturation was noted during this study (Min O2 = 67%). - Patient snored 10.7% during the sleep.  DIAGNOSIS - Obstructive Sleep Apnea (G47.33) - Nocturnal Hypoxemia (G47.36)  RECOMMENDATIONS - Recommend in lab CPAP titration due to severity of hypoxemia.  - Therapeutic CPAP titration to determine optimal pressure required to alleviate sleep disordered breathing. - Avoid alcohol, sedatives and other CNS depressants that may worsen sleep apnea and disrupt normal sleep architecture. - Sleep hygiene should be reviewed to assess factors that may improve sleep quality. - Weight management and regular exercise should be initiated or  continued.  [Electronically signed] 01/07/2020 10:56 AM  Armanda Magic MD, ABSM Diplomate, American Board of Sleep Medicine

## 2020-01-08 ENCOUNTER — Telehealth: Payer: Self-pay | Admitting: *Deleted

## 2020-01-08 DIAGNOSIS — I10 Essential (primary) hypertension: Secondary | ICD-10-CM

## 2020-01-08 NOTE — Telephone Encounter (Signed)
-----   Message from Quintella Reichert, MD sent at 01/07/2020 10:58 AM EDT ----- Please let patient know that they have sleep apnea and recommend CPAP titration. Please set up titration in the sleep lab.

## 2020-01-08 NOTE — Telephone Encounter (Signed)
Informed patient of sleep study results and patient understanding was verbalized. Patient understands her sleep study showed they have sleep apnea and recommend CPAP titration. Please set up titration in the sleep lab.  Pt is aware and agreeable to her results.  Titration sent to sleep pool 

## 2020-01-11 ENCOUNTER — Telehealth: Payer: Self-pay | Admitting: *Deleted

## 2020-01-11 NOTE — Addendum Note (Signed)
Addended by: Reesa Chew on: 01/11/2020 09:56 AM   Modules accepted: Orders

## 2020-01-11 NOTE — Telephone Encounter (Signed)
Staff message sent to [place] ok to schedule CPAP titration. Per [facility] web portal no PA is required. Decision [id number].

## 2020-01-12 NOTE — Telephone Encounter (Signed)
Ann Castillo, CMA  Reesa Chew, CMA Ok to schedule. Per [facility] web portal no PA is required.

## 2020-01-12 NOTE — Telephone Encounter (Addendum)
Patient is scheduled for CPAP Titration on  Patient understands her titration study will be done at [facility] sleep lab. Patient understands she will receive a letter in a week or so detailing appointment, date, time, and location. Patient understands to call if she does not receive the letter  in a timely manner.  Patient wants to talk to her PCP before agreeing to the titration and she will call us back.

## 2020-02-08 ENCOUNTER — Other Ambulatory Visit: Payer: Self-pay

## 2020-02-08 ENCOUNTER — Other Ambulatory Visit: Payer: Medicare Other | Admitting: *Deleted

## 2020-02-08 DIAGNOSIS — I10 Essential (primary) hypertension: Secondary | ICD-10-CM

## 2020-02-08 LAB — HEPATIC FUNCTION PANEL
ALT: 22 IU/L (ref 0–32)
AST: 20 IU/L (ref 0–40)
Albumin: 4.1 g/dL (ref 3.8–4.9)
Alkaline Phosphatase: 74 IU/L (ref 44–121)
Bilirubin Total: 0.3 mg/dL (ref 0.0–1.2)
Bilirubin, Direct: 0.11 mg/dL (ref 0.00–0.40)
Total Protein: 6.1 g/dL (ref 6.0–8.5)

## 2020-02-08 LAB — LIPID PANEL
Chol/HDL Ratio: 3.7 ratio (ref 0.0–4.4)
Cholesterol, Total: 200 mg/dL — ABNORMAL HIGH (ref 100–199)
HDL: 54 mg/dL (ref >39)
LDL Chol Calc (NIH): 120 mg/dL — ABNORMAL HIGH (ref 0–99)
Triglycerides: 149 mg/dL (ref 0–149)
VLDL Cholesterol Cal: 26 mg/dL (ref 5–40)

## 2020-02-09 ENCOUNTER — Telehealth: Payer: Self-pay

## 2020-02-09 DIAGNOSIS — E7800 Pure hypercholesterolemia, unspecified: Secondary | ICD-10-CM

## 2020-02-09 NOTE — Telephone Encounter (Signed)
-----   Message from Quintella Reichert, MD sent at 02/09/2020 11:00 AM EDT ----- Repeat FLP in 2 months ----- Message ----- From: Theresia Majors, RN Sent: 02/09/2020  10:03 AM EDT To: Quintella Reichert, MD  The patient has been notified of the result and verbalized understanding.  All questions (if any) were answered. Theresia Majors, RN 02/09/2020 10:02 AM  Patient states that she has been taking Repatha regularly. She has not missed any doses, however took one dose about a week late due to being out of town.

## 2020-02-09 NOTE — Telephone Encounter (Signed)
Spoke with the patient and she is scheduled for repeat labs 11/15

## 2020-02-14 ENCOUNTER — Encounter (HOSPITAL_BASED_OUTPATIENT_CLINIC_OR_DEPARTMENT_OTHER): Payer: Medicare Other | Admitting: Cardiology

## 2020-04-11 ENCOUNTER — Other Ambulatory Visit: Payer: Medicare Other

## 2020-04-11 ENCOUNTER — Other Ambulatory Visit: Payer: Self-pay

## 2020-04-11 DIAGNOSIS — E7800 Pure hypercholesterolemia, unspecified: Secondary | ICD-10-CM

## 2020-04-12 LAB — LIPID PANEL
Chol/HDL Ratio: 3.9 ratio (ref 0.0–4.4)
Cholesterol, Total: 203 mg/dL — ABNORMAL HIGH (ref 100–199)
HDL: 52 mg/dL (ref >39)
LDL Chol Calc (NIH): 123 mg/dL — ABNORMAL HIGH (ref 0–99)
Triglycerides: 156 mg/dL — ABNORMAL HIGH (ref 0–149)
VLDL Cholesterol Cal: 28 mg/dL (ref 5–40)

## 2020-04-12 MED ORDER — EZETIMIBE 10 MG PO TABS: 10.0000 mg | ORAL_TABLET | Freq: Every day | ORAL | 11 refills | Status: DC

## 2020-04-12 NOTE — Addendum Note (Signed)
Addended by: Mirranda Monrroy E on: 04/12/2020 11:12 AM   Modules accepted: Orders

## 2020-05-05 ENCOUNTER — Telehealth: Payer: Self-pay | Admitting: Pharmacist

## 2020-05-05 DIAGNOSIS — E7800 Pure hypercholesterolemia, unspecified: Secondary | ICD-10-CM

## 2020-05-05 NOTE — Telephone Encounter (Signed)
Called pt to follow up with ezetimibe tolerability (began about 3 weeks ago). Pt reports tolerating therapy well. Continues on Repatha injections with no issues either. Scheduled follow up labs in Feb to assess efficacy.

## 2020-06-28 ENCOUNTER — Other Ambulatory Visit: Payer: Medicare Other | Admitting: *Deleted

## 2020-06-28 ENCOUNTER — Other Ambulatory Visit: Payer: Self-pay

## 2020-06-28 DIAGNOSIS — E7800 Pure hypercholesterolemia, unspecified: Secondary | ICD-10-CM

## 2020-06-28 LAB — LIPID PANEL
Chol/HDL Ratio: 3.9 ratio (ref 0.0–4.4)
Cholesterol, Total: 217 mg/dL — ABNORMAL HIGH (ref 100–199)
HDL: 55 mg/dL (ref >39)
LDL Chol Calc (NIH): 137 mg/dL — ABNORMAL HIGH (ref 0–99)
Triglycerides: 138 mg/dL (ref 0–149)
VLDL Cholesterol Cal: 25 mg/dL (ref 5–40)

## 2020-06-28 LAB — HEPATIC FUNCTION PANEL
ALT: 27 IU/L (ref 0–32)
AST: 20 IU/L (ref 0–40)
Albumin: 4 g/dL (ref 3.8–4.9)
Alkaline Phosphatase: 72 IU/L (ref 44–121)
Bilirubin Total: 0.3 mg/dL (ref 0.0–1.2)
Bilirubin, Direct: <0.1 mg/dL (ref 0.00–0.40)
Total Protein: 6.4 g/dL (ref 6.0–8.5)

## 2020-06-29 ENCOUNTER — Telehealth: Payer: Self-pay | Admitting: Pharmacist

## 2020-06-29 DIAGNOSIS — E7800 Pure hypercholesterolemia, unspecified: Secondary | ICD-10-CM

## 2020-06-29 MED ORDER — NEXLETOL 180 MG PO TABS: 1.0000 | ORAL_TABLET | Freq: Every day | ORAL | 11 refills | Status: DC

## 2020-06-29 NOTE — Telephone Encounter (Addendum)
Called pt to discuss lipid panel results. LDL has increased to 137, pt states she stopped taking ezetimibe secondary to stomach cramping and diarrhea. She does continue on Repatha injections twice monthly. Previously intolerant to pravastatin and rosuvastatin (myalgias). 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   Discussed adding Nexletol to lower LDL an extra 20-30% and she is willing to try this. Prior authorization submitted and approved through 05/27/21. [facility] also approved to bring both Repatha and Nexletol copays from $47 to $0. Grant info called into [facility]. Will recheck lipids in 3 months.

## 2020-07-01 ENCOUNTER — Encounter: Payer: Self-pay | Admitting: Physician Assistant

## 2020-07-01 ENCOUNTER — Ambulatory Visit (INDEPENDENT_AMBULATORY_CARE_PROVIDER_SITE_OTHER): Payer: Medicare Other | Admitting: Physician Assistant

## 2020-07-01 ENCOUNTER — Other Ambulatory Visit: Payer: Self-pay

## 2020-07-01 DIAGNOSIS — F3342 Major depressive disorder, recurrent, in full remission: Secondary | ICD-10-CM | POA: Diagnosis not present

## 2020-07-01 MED ORDER — ESCITALOPRAM OXALATE 10 MG PO TABS: ORAL_TABLET | ORAL | 1 refills | Status: DC

## 2020-07-01 MED ORDER — ESCITALOPRAM OXALATE 20 MG PO TABS: ORAL_TABLET | ORAL | 1 refills | Status: DC

## 2020-07-01 NOTE — Progress Notes (Signed)
[facility]  Patient ID: Ann Castillo,  MRN: [id number]  PCP: [place], Sarah T, MD  Date of Evaluation: 07/01/2020 Time spent:20 minutes  Chief Complaint:  Chief Complaint    Depression; Anxiety      HISTORY/CURRENT STATUS: HPI Med check  Lost to f/u for 1.5 years, mostly due to covid. She was seeing a new counselor in the fall, who recommended she come back here, thinking she needed to increase the Lexapro.  Now patient is doing better and doesn't feel like she needs a higher dose. She was going through some things but with therapy she is learned how to deal with them better.  Husband retired recently.  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  She is very excited about that.  It is part-time, 12 hours/week.  Patient denies loss of interest in usual activities and is able to enjoy things.  Denies decreased energy or motivation.  Appetite has not changed.  No extreme sadness, tearfulness, or feelings of hopelessness.  Denies any changes in concentration, making decisions or remembering things.  She sleeps well, uses CBD before bed and that helps her relax.  Not having a lot of anxiety.  Denies suicidal or homicidal thoughts.  Patient denies increased energy with decreased need for sleep, no increased talkativeness, no racing thoughts, no impulsivity or risky behaviors, no increased spending, no increased libido, no grandiosity, no increased irritability or anger, and no hallucinations.  Denies dizziness, syncope, seizures, numbness, tingling, tremor, tics, unsteady gait, slurred speech, confusion. Denies muscle or joint pain, stiffness, or dystonia.  Individual Medical History/ Review of Systems: Changes? :No    Past medications for mental health diagnoses include: Lexapro, trazodone, loxapine, Ambien, Klonopin, melatonin has not helped  Allergies: Celecoxib, Codeine, Estrogenic substance, Hydromorphone hcl, Latex,  Pravastatin sodium, Rosuvastatin, Codeine phosphate, Dilaudid [hydromorphone hcl], Estrogen, Pravastatin sodium, and Zetia [ezetimibe]  Current Medications:  Current Outpatient Medications:  .  Barberry-Oreg Grape-Goldenseal (BERBERINE COMPLEX PO), Take 500 mg by mouth as directed., Disp: , Rfl:  .  Bempedoic Acid (NEXLETOL) 180 MG TABS, Take 1 tablet by mouth daily., Disp: 30 tablet, Rfl: 11 .  Cholecalciferol (VITAMIN D3 PO), Take 5,000 Units by mouth 2 (two) times daily., Disp: , Rfl:  .  Evolocumab (REPATHA SURECLICK) 140 MG/ML SOAJ, Inject 1 pen into the skin every 14 (fourteen) days., Disp: 2 pen, Rfl: 11 .  lisinopril (ZESTRIL) 10 MG tablet, Take 1 tablet (10 mg total) by mouth daily., Disp: 90 tablet, Rfl: 3 .  magnesium gluconate (MAGONATE) 500 MG tablet, Take 500 mg by mouth daily., Disp: , Rfl:  .  meloxicam (MOBIC) 15 MG tablet, Take 15 mg by mouth daily as needed for pain. , Disp: , Rfl:  .  Milk Thistle 150 MG CAPS, Take 150 mg by mouth 2 (two) times daily., Disp: , Rfl:  .  OVER THE COUNTER MEDICATION, Source of life gold tablets, Disp: , Rfl:  .  OVER THE COUNTER MEDICATION, adaptocrine, Disp: , Rfl:  .  OVER THE COUNTER MEDICATION, GENTLE MOVE STOOL SOFTENER BLEND, Disp: , Rfl:  .  OVER THE COUNTER MEDICATION, L Glutamine 750 mg, Disp: , Rfl:  .  OVER THE COUNTER MEDICATION, Digestive enzyme, Disp: , Rfl:  .  OVER THE COUNTER MEDICATION, MONOLAURIN 500 MG, Disp: , Rfl:  .  OVER THE COUNTER MEDICATION, Joint binder, Disp: , Rfl:  .  UNABLE TO FIND, 10 mg as needed. Med Name:  CBD oil, Disp: , Rfl:  .  vitamin B-12 (CYANOCOBALAMIN) 100 MCG tablet, Take 100 mcg by mouth daily., Disp: , Rfl:  .  Chromium Picolinate 500 MCG CAPS, Take 500 mcg by mouth as directed. (Patient not taking: Reported on 07/01/2020), Disp: , Rfl:  .  escitalopram (LEXAPRO) 10 MG tablet, TAKE 1 TABLET BY MOUTH  DAILY ADD TO 20 MG TO EQUAL 30 MG, Disp: 90 tablet, Rfl: 1 .  escitalopram (LEXAPRO) 20 MG tablet,  TAKE 1 TABLET BY MOUTH  DAILY , ADD TO 10 MG TO  EQUAL 30 MG, Disp: 90 tablet, Rfl: 1 Medication Side Effects: none  Family Medical/ Social History: Changes?  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  See HPI.  MENTAL HEALTH EXAM:  Last menstrual period 12/26/2012.There is no height or weight on file to calculate BMI.  General Appearance: Casual, Neat, Well Groomed and Obese  Eye Contact:  Good  Speech:  Clear and Coherent and Normal Rate  Volume:  Normal  Mood:  Euthymic  Affect:  Appropriate  Thought Process:  Goal Directed and Descriptions of Associations: Intact  Orientation:  Full (Time, Place, and Person)  Thought Content: Logical   Suicidal Thoughts:  No  Homicidal Thoughts:  No  Memory:  WNL  Judgement:  Good  Insight:  Good  Psychomotor Activity:  Normal  Concentration:  Concentration: Good  Recall:  Good  Fund of Knowledge: Good  Language: Good  Assets:  Desire for Improvement  ADL's:  Intact  Cognition: WNL  Prognosis:  Good    DIAGNOSES:    ICD-10-CM   1. Major depression, recurrent, full remission (HCC)  F33.42     Receiving Psychotherapy: No    RECOMMENDATIONS:  PDMP reviewed. I provided 20 mins of face to face time during this encounter, discussing her dx and tx options.  Since she is doing so well, we agree that there is no need at this point to increase the Lexapro.  We can do that later if needed.  She even asks about decreasing it at some point.  If she decides to do so I would recommend a total of 25 mg for at least 2 weeks and then if she wants to decrease to 20 mg daily.  She can do that without calling, unless she has problems. Continue Lexapro 10 mg +20 mg to equal 30 mg daily. Continue multivitamin, vitamin D, B complex. Restart therapy if needed. Return in 6 months.  Melony Overly, PA-C

## 2020-09-26 ENCOUNTER — Other Ambulatory Visit: Payer: Self-pay

## 2020-09-26 ENCOUNTER — Other Ambulatory Visit: Payer: Medicare Other | Admitting: *Deleted

## 2020-09-26 ENCOUNTER — Other Ambulatory Visit: Payer: Self-pay | Admitting: Cardiology

## 2020-09-26 DIAGNOSIS — E7800 Pure hypercholesterolemia, unspecified: Secondary | ICD-10-CM

## 2020-09-27 LAB — COMPREHENSIVE METABOLIC PANEL
ALT: 50 IU/L — ABNORMAL HIGH (ref 0–32)
AST: 42 IU/L — ABNORMAL HIGH (ref 0–40)
Albumin/Globulin Ratio: 2 (ref 1.2–2.2)
Albumin: 4.2 g/dL (ref 3.8–4.9)
Alkaline Phosphatase: 59 IU/L (ref 44–121)
BUN/Creatinine Ratio: 22 (ref 9–23)
BUN: 18 mg/dL (ref 6–24)
Bilirubin Total: 0.4 mg/dL (ref 0.0–1.2)
CO2: 21 mmol/L (ref 20–29)
Calcium: 9.4 mg/dL (ref 8.7–10.2)
Chloride: 105 mmol/L (ref 96–106)
Creatinine, Ser: 0.82 mg/dL (ref 0.57–1.00)
Globulin, Total: 2.1 g/dL (ref 1.5–4.5)
Glucose: 134 mg/dL — ABNORMAL HIGH (ref 65–99)
Potassium: 3.8 mmol/L (ref 3.5–5.2)
Sodium: 141 mmol/L (ref 134–144)
Total Protein: 6.3 g/dL (ref 6.0–8.5)
eGFR: 83 mL/min/{1.73_m2} (ref >59)

## 2020-09-27 LAB — LIPID PANEL
Chol/HDL Ratio: 3.6 ratio (ref 0.0–4.4)
Cholesterol, Total: 146 mg/dL (ref 100–199)
HDL: 41 mg/dL (ref >39)
LDL Chol Calc (NIH): 76 mg/dL (ref 0–99)
Triglycerides: 168 mg/dL — ABNORMAL HIGH (ref 0–149)
VLDL Cholesterol Cal: 29 mg/dL (ref 5–40)

## 2020-11-10 ENCOUNTER — Ambulatory Visit: Payer: Medicare Other | Admitting: Cardiology

## 2020-11-15 ENCOUNTER — Other Ambulatory Visit: Payer: Self-pay | Admitting: Physician Assistant

## 2020-11-29 ENCOUNTER — Other Ambulatory Visit: Payer: Self-pay | Admitting: Physician Assistant

## 2020-12-27 ENCOUNTER — Encounter: Payer: Self-pay | Admitting: Obstetrics & Gynecology

## 2020-12-30 ENCOUNTER — Encounter: Payer: Self-pay | Admitting: Physician Assistant

## 2020-12-30 ENCOUNTER — Ambulatory Visit: Payer: Medicare Other | Admitting: Physician Assistant

## 2020-12-30 ENCOUNTER — Other Ambulatory Visit: Payer: Self-pay

## 2020-12-30 DIAGNOSIS — F3342 Major depressive disorder, recurrent, in full remission: Secondary | ICD-10-CM

## 2020-12-30 DIAGNOSIS — E6609 Other obesity due to excess calories: Secondary | ICD-10-CM | POA: Diagnosis not present

## 2020-12-30 MED ORDER — ESCITALOPRAM OXALATE 10 MG PO TABS: ORAL_TABLET | ORAL | 1 refills | Status: DC

## 2020-12-30 MED ORDER — ESCITALOPRAM OXALATE 20 MG PO TABS: ORAL_TABLET | ORAL | 1 refills | Status: DC

## 2020-12-30 NOTE — Progress Notes (Signed)
[facility]  Patient ID: Ann Castillo,  MRN: [id number]  PCP: [place], Sarah T, MD  Date of Evaluation: 12/30/2020 Time spent:20 minutes  Chief Complaint:  Chief Complaint   Anxiety; Depression; Follow-up     HISTORY/CURRENT STATUS: HPI Med check  Patient denies loss of interest in usual activities and is able to enjoy things.  Denies decreased energy or motivation.  Appetite has not changed.  Has joined a gym and is working out more.  No extreme sadness, tearfulness, or feelings of hopelessness. Anxiety is controlled unless triggered by something. Denies any changes in concentration, making decisions or remembering things.  Denies suicidal or homicidal thoughts.  Patient denies increased energy with decreased need for sleep, no increased talkativeness, no racing thoughts, no impulsivity or risky behaviors, no increased spending, no increased libido, no grandiosity, no increased irritability or anger, no paranoia, and no hallucinations.  Denies dizziness, syncope, seizures, numbness, tingling, tremor, tics, unsteady gait, slurred speech, confusion. Denies muscle or joint pain, stiffness, or dystonia.  Individual Medical History/ Review of Systems: Changes? :No    Past medications for mental health diagnoses include: Lexapro, trazodone, loxapine, Ambien, Klonopin, melatonin has not helped  Allergies: Celecoxib, Codeine, Estrogenic substance, Hydromorphone hcl, Latex, Pravastatin sodium, Rosuvastatin, Codeine phosphate, Dilaudid [hydromorphone hcl], Estrogen, Pravastatin sodium, and Zetia [ezetimibe]  Current Medications:  Current Outpatient Medications:    Barberry-Oreg Grape-Goldenseal (BERBERINE COMPLEX PO), Take 500 mg by mouth as directed., Disp: , Rfl:    Bempedoic Acid (NEXLETOL) 180 MG TABS, Take 1 tablet by mouth daily., Disp: 30 tablet, Rfl: 11   lisinopril (ZESTRIL) 10 MG tablet, Take 1 tablet (10 mg total) by mouth daily., Disp: 90 tablet, Rfl: 3    magnesium gluconate (MAGONATE) 500 MG tablet, Take 500 mg by mouth daily., Disp: , Rfl:    meloxicam (MOBIC) 15 MG tablet, Take 15 mg by mouth daily as needed for pain. , Disp: , Rfl:    Milk Thistle 150 MG CAPS, Take 150 mg by mouth 2 (two) times daily., Disp: , Rfl:    OVER THE COUNTER MEDICATION, Source of life gold tablets, Disp: , Rfl:    OVER THE COUNTER MEDICATION, GENTLE MOVE STOOL SOFTENER BLEND, Disp: , Rfl:    REPATHA SURECLICK 453 MG/ML SOAJ, INJECT 1 PEN INTO THE SKIN EVERY 14 (FOURTEEN) DAYS., Disp: 6 mL, Rfl: 3   UNABLE TO FIND, 10 mg as needed. Med Name: CBD oil, Disp: , Rfl:    Cholecalciferol (VITAMIN D3 PO), Take 5,000 Units by mouth 2 (two) times daily. (Patient not taking: Reported on 12/30/2020), Disp: , Rfl:    Chromium Picolinate 500 MCG CAPS, Take 500 mcg by mouth as directed. (Patient not taking: No sig reported), Disp: , Rfl:    escitalopram (LEXAPRO) 10 MG tablet, 1 po qd with 20 mg=30 mg/d, Disp: 90 tablet, Rfl: 1   escitalopram (LEXAPRO) 20 MG tablet, TAKE 1 TABLET BY MOUTH  DAILY IN ADDITION TO 10 MG  TO EQUAL 30 MG, Disp: 90 tablet, Rfl: 1   OVER THE COUNTER MEDICATION, adaptocrine (Patient not taking: Reported on 12/30/2020), Disp: , Rfl:    OVER THE COUNTER MEDICATION, L Glutamine 750 mg (Patient not taking: Reported on 12/30/2020), Disp: , Rfl:    OVER THE COUNTER MEDICATION, Digestive enzyme (Patient not taking: Reported on 12/30/2020), Disp: , Rfl:    OVER THE COUNTER MEDICATION, MONOLAURIN 500 MG (Patient not taking: Reported on 12/30/2020), Disp: , Rfl:    OVER THE COUNTER MEDICATION, Joint  binder (Patient not taking: Reported on 12/30/2020), Disp: , Rfl:    vitamin B-12 (CYANOCOBALAMIN) 100 MCG tablet, Take 100 mcg by mouth daily. (Patient not taking: Reported on 12/30/2020), Disp: , Rfl:  Medication Side Effects: none  Family Medical/ Social History: Changes?  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  MENTAL HEALTH  EXAM:  Last menstrual period 12/26/2012.There is no height or weight on file to calculate BMI.  General Appearance: Casual, Neat, Well Groomed and Obese  Eye Contact:  Good  Speech:  Clear and Coherent and Normal Rate  Volume:  Normal  Mood:  Euthymic  Affect:  Appropriate  Thought Process:  Goal Directed and Descriptions of Associations: Circumstantial  Orientation:  Full (Time, Place, and Person)  Thought Content: Logical   Suicidal Thoughts:  No  Homicidal Thoughts:  No  Memory:  WNL  Judgement:  Good  Insight:  Good  Psychomotor Activity:  Normal  Concentration:  Concentration: Good  Recall:  Good  Fund of Knowledge: Good  Language: Good  Assets:  Desire for Improvement  ADL's:  Intact  Cognition: WNL  Prognosis:  Good    DIAGNOSES:    ICD-10-CM   1. Major depression, recurrent, full remission ([place])  F33.42     2. Obesity due to excess calories, unspecified classification, unspecified whether serious comorbidity present  E66.09        Receiving Psychotherapy: No    RECOMMENDATIONS:  PDMP reviewed.  No results are available. I provided 20 minutes of face to face time during this encounter, including time spent before and after the visit in records review, medical decision making, and charting.  She is doing really well so no changes in treatment are necessary. Continue going to the gym, healthy diet, weight loss. Continue Lexapro 10 mg +20 mg to equal 30 mg daily. Continue multivitamin, vitamin D, B complex. Return in 6 months.  Donnal Moat, PA-C

## 2021-02-22 ENCOUNTER — Ambulatory Visit: Payer: Medicare Other | Admitting: Cardiology

## 2021-02-22 ENCOUNTER — Other Ambulatory Visit: Payer: Self-pay

## 2021-02-22 ENCOUNTER — Encounter: Payer: Self-pay | Admitting: Cardiology

## 2021-02-22 VITALS — BP 100/62 | HR 54 | Ht 64.0 in | Wt 267.8 lb

## 2021-02-22 DIAGNOSIS — R55 Syncope and collapse: Secondary | ICD-10-CM

## 2021-02-22 DIAGNOSIS — I10 Essential (primary) hypertension: Secondary | ICD-10-CM

## 2021-02-22 DIAGNOSIS — I6521 Occlusion and stenosis of right carotid artery: Secondary | ICD-10-CM | POA: Diagnosis not present

## 2021-02-22 DIAGNOSIS — E7800 Pure hypercholesterolemia, unspecified: Secondary | ICD-10-CM | POA: Diagnosis not present

## 2021-02-22 DIAGNOSIS — G4733 Obstructive sleep apnea (adult) (pediatric): Secondary | ICD-10-CM

## 2021-02-22 MED ORDER — ASPIRIN EC 81 MG PO TBEC: 81.0000 mg | DELAYED_RELEASE_TABLET | Freq: Every day | ORAL | 3 refills | Status: DC

## 2021-02-22 NOTE — Addendum Note (Signed)
Addended by: Theresia Majors on: 02/22/2021 01:52 PM   Modules accepted: Orders

## 2021-02-22 NOTE — Patient Instructions (Signed)
Medication Instructions:  Your physician has recommended you make the following change in your medication: 1) START taking Aspirin 81 mg daily  *If you need a refill on your cardiac medications before your next appointment, please call your pharmacy*   Lab Work: Come back for fasting lipids and ALT   If you have labs (blood work) drawn today and your tests are completely normal, you will receive your results only by: MyChart Message (if you have MyChart) OR A paper copy in the mail If you have any lab test that is abnormal or we need to change your treatment, we will call you to review the results.   Testing/Procedures: CPAP Titration - our sleep team will be in touch with you to get this scheduled.   Follow-Up: At [facility], you and your health needs are our priority.  As part of our continuing mission to provide you with exceptional heart care, we have created designated Provider Care Teams.  These Care Teams include your primary Cardiologist (physician) and Advanced Practice Providers (APPs -  Physician Assistants and Nurse Practitioners) who all work together to provide you with the care you need, when you need it.  Follow up with Dr. Mayford Knife after CPAP titration

## 2021-02-22 NOTE — Progress Notes (Signed)
Cardiology  Note    Date:  02/22/2021   ID:  Ann Castillo, Bettcher Sep 14, 1960, MRN [id number]  PCP:  [place], Sarah T, MD  Cardiologist:  Armanda Magic, MD   Chief Complaint  Patient presents with   Follow-up    Syncope, HTN, RVE, OSA, carotid stenosis     History of Present Illness:   This is a 60yo female with a hx of GERD, Obesity, HLD, HTN who was referred for evaluation of HTN and syncope. 2D echo showed normal LVF with EF 55-60%, mild RVE with normal RVF, trivial MR and no AS.  Carotid dopplers showed 1-39% right carotid stenosis. Heart monitor showed no bradyarrhythmias or tachyarrhythmias.  Chest CTA with no PE. Sleep study showed mild OSA with severe nocturnal hypoxemia and CPAP titration was recommended but never followed through.  She is here today for followup and is doing well.  She denies any chest pain or pressure, SOB, DOE, PND, orthopnea, LE edema, dizziness, palpitations or syncope. She sleeps sitting up in a recliner due to chronic back pain and her HST was done sitting up to sleep. She is compliant with her meds and is tolerating meds with no SE.     Past Medical History:  Diagnosis Date   Anemia, unspecified    Anxiety    Carotid stenosis, right    1-39% by doppler 10/2019   Cavus deformity of foot, acquired    Depression    Dysthymic disorder    Edema    bilateral   Esophageal reflux    Essential hypertension, benign    Hx pulmonary embolism 2006   was on estrogen    Insomnia, unspecified    Major depressive disorder, single episode, unspecified    Migraine, unspecified, without mention of intractable migraine without mention of status migrainosus    Myalgia and myositis, unspecified    Obesity, unspecified    Other and unspecified hyperlipidemia    Polycystic ovaries    Unspecified gastritis and gastroduodenitis without mention of hemorrhage     Past Surgical History:  Procedure Laterality Date   no surgical history      Current  Medications: Current Meds  Medication Sig   Barberry-Oreg Grape-Goldenseal (BERBERINE COMPLEX PO) Take 500 mg by mouth as directed.   Bempedoic Acid (NEXLETOL) 180 MG TABS Take 1 tablet by mouth daily.   Cholecalciferol (VITAMIN D3 PO) Take 5,000 Units by mouth 2 (two) times daily.   Chromium Picolinate 500 MCG CAPS Take 500 mcg by mouth as directed.   CINNAMON PO Take 250 mg by mouth daily.   escitalopram (LEXAPRO) 10 MG tablet 1 po qd with 20 mg=30 mg/d   escitalopram (LEXAPRO) 20 MG tablet TAKE 1 TABLET BY MOUTH  DAILY IN ADDITION TO 10 MG  TO EQUAL 30 MG   lisinopril (ZESTRIL) 10 MG tablet Take 1 tablet (10 mg total) by mouth daily.   magnesium gluconate (MAGONATE) 500 MG tablet Take 500 mg by mouth daily.   meloxicam (MOBIC) 15 MG tablet Take 15 mg by mouth daily as needed for pain.    Milk Thistle 150 MG CAPS Take 150 mg by mouth 2 (two) times daily.   OVER THE COUNTER MEDICATION Source of life gold tablets   OVER THE COUNTER MEDICATION GENTLE MOVE STOOL SOFTENER BLEND   OVER THE COUNTER MEDICATION L Glutamine 750 mg   OVER THE COUNTER MEDICATION Digestive enzyme   OVER THE COUNTER MEDICATION curcumin   REPATHA SURECLICK 140 MG/ML SOAJ INJECT  1 PEN INTO THE SKIN EVERY 14 (FOURTEEN) DAYS.   UNABLE TO FIND 10 mg as needed. Med Name: CBD oil   vitamin B-12 (CYANOCOBALAMIN) 100 MCG tablet Take 100 mcg by mouth daily.    Allergies:   Celecoxib, Codeine, Estrogenic substance, Hydromorphone hcl, Latex, Pravastatin sodium, Rosuvastatin, Codeine phosphate, Dilaudid [hydromorphone hcl], Estrogen, Pravastatin sodium, and Zetia [ezetimibe]   Social History   Socioeconomic History   Marital status: Married    Spouse name: Not on file   Number of children: 3   Years of education: College   Highest education level: Not on file  Occupational History   Occupation: PAR[employer]    Employer: [employer]  Tobacco Use   Smoking status: Never   Smokeless tobacco: Never  Vaping Use    Vaping Use: Never used  Substance and Sexual Activity   Alcohol use: Yes    Alcohol/week: 0.0 - 1.0 standard drinks   Drug use: No   Sexual activity: Not Currently    Comment: 1st intercourse- 20, partners- 1, married- 38 yr s  Other Topics Concern   Not on file  Social History Narrative   Married, works as a IT consultant.  She has significant stress from daughter, who is mentally ill and lives at home (age 8).     Caffeine: very rarely    Social Determinants of Corporate investment banker Strain: Not on file  Food Insecurity: Not on file  Transportation Needs: Not on file  Physical Activity: Not on file  Stress: Not on file  Social Connections: Not on file     Family History:  The patient's family history includes ADD / ADHD in her daughter and daughter; Alzheimer's disease in her maternal uncle, mother, and paternal grandfather; Breast cancer in her maternal aunt and maternal grandmother; Diabetes in her father; Gallbladder disease in her father; Heart attack in her father; Heart disease in her father; Hypertension in her son; Learning disabilities in her daughter; Liver cancer in her father; Miscarriages / Stillbirths in her mother; Pancreatic cancer in her father; Personality disorder in her daughter; Tourette syndrome in her son.   ROS:   Please see the history of present illness.    ROS All other systems reviewed and are negative.  No flowsheet data found.   PHYSICAL EXAM:   VS:  BP 100/62   Pulse (!) 54   Ht 5\' 4"  (1.626 m)   Wt 267 lb 12.8 oz (121.5 kg)   LMP 12/26/2012   BMI 45.97 kg/m    GEN: Well nourished, well developed in no acute distress HEENT: Normal NECK: No JVD; No carotid bruits LYMPHATICS: No lymphadenopathy CARDIAC:RRR, no murmurs, rubs, gallops RESPIRATORY:  Clear to auscultation without rales, wheezing or rhonchi  ABDOMEN: Soft, non-tender, non-distended MUSCULOSKELETAL:  No edema; No deformity  SKIN: Warm and dry NEUROLOGIC:  Alert and oriented  x 3 PSYCHIATRIC:  Normal affect    Wt Readings from Last 3 Encounters:  02/22/21 267 lb 12.8 oz (121.5 kg)  01/05/20 270 lb (122.5 kg)  11/16/19 274 lb (124.3 kg)      Studies/Labs Reviewed:   EKG:  EKG is ordered today and showed sinus bradycardia at 54bpm with nonspecific T wave abnormality  Recent Labs: 09/26/2020: ALT 50; BUN 18; Creatinine, Ser 0.82; Potassium 3.8; Sodium 141    Lipid Panel    Component Value Date/Time   CHOL 146 09/26/2020 1051   TRIG 168 (H) 09/26/2020 1051   HDL 41 09/26/2020 1051  CHOLHDL 3.6 09/26/2020 1051   CHOLHDL 4.2 07/16/2011 1023   VLDL 28 07/16/2011 1023   LDLCALC 76 09/26/2020 1051   LDLDIRECT 181.4 02/14/2009 0000    Additional studies/ records that were reviewed today include:  OV notes ASSESSMENT:    1. Syncope and collapse   2. Essential hypertension, benign   3. Carotid stenosis, right   4. Pure hypercholesterolemia   5. OSA (obstructive sleep apnea)      PLAN:  In order of problems listed above:  1.  Syncope -Her symptoms were not clear cut and could not discern whether had  true syncope -some of her falls were related to tripping while other times it sounded like presyncope or syncope -orthostatic BPs were normal but soft so diuretic part of Lisinopril HCT was stopped and continued on Lisinipril 10mg  daily -2D echo with normal LVF and no valvular disease -carotid dopplers with 1-39% right carotid stenosis -no pauses or arrhythmias on event monitor -chest CTA with no PE -she has not had any further syncope -consider ILR if she has any further presyncope or syncope  2.  HTN -BP is adequately controlled -continue prescription drug management with lisinopril 10mg  daily with PRN refills  3.  Carotid stenosis -dopplers showed 1-39% right -repeat dopplers 10/2021 -currently on Bempedoic acid and Repatha -start ASA 81mg  daily  4.  HLD -LDL goal < 70 due to carotid stenosis -I have personally reviewed and interpreted  outside labs performed by patient's PCP which showed LDL 76, HDL 41, ALT 50 in May 2022  -she is statin intolerant -seen in lipid clinic and started on Bempedoic acid 180mg  daily and Repatha -repeat FLP and ALT  5.  RV dilatation/OSA/Nocturnal hypoxemia -mild on echo 2021 -no PE on CTA 2021 -very mild OSA on sleep study with AHI 8.6/hr and nocturnal hypoxemia on sleep study 12/2019 -CPAP titration recommended by never followed through -will get set up for CPAP titfration  Medication Adjustments/Labs and Tests Ordered: Current medicines are reviewed at length with the patient today.  Concerns regarding medicines are outlined above.  Medication changes, Labs and Tests ordered today are listed in the Patient Instructions below.  There are no Patient Instructions on file for this visit.   Signed, , MD  02/22/2021 1:41 PM    [facility] [address] [place], [place], [facility]  [address] Phone: [phone]; Fax: [phone]

## 2021-02-24 ENCOUNTER — Other Ambulatory Visit: Payer: Medicare Other | Admitting: *Deleted

## 2021-02-24 ENCOUNTER — Other Ambulatory Visit: Payer: Self-pay

## 2021-02-24 DIAGNOSIS — E7800 Pure hypercholesterolemia, unspecified: Secondary | ICD-10-CM

## 2021-02-24 LAB — ALT: ALT: 50 IU/L — ABNORMAL HIGH (ref 0–32)

## 2021-02-24 LAB — LIPID PANEL
Chol/HDL Ratio: 3 ratio (ref 0.0–4.4)
Cholesterol, Total: 129 mg/dL (ref 100–199)
HDL: 43 mg/dL (ref >39)
LDL Chol Calc (NIH): 60 mg/dL (ref 0–99)
Triglycerides: 148 mg/dL (ref 0–149)
VLDL Cholesterol Cal: 26 mg/dL (ref 5–40)

## 2021-03-02 ENCOUNTER — Telehealth: Payer: Self-pay | Admitting: *Deleted

## 2021-03-02 NOTE — Telephone Encounter (Signed)
-----   Message from Theresia Majors, RN sent at 02/22/2021  1:56 PM EDT ----- CPAP titration has been ordered.  Thanks!

## 2021-03-02 NOTE — Telephone Encounter (Signed)
Prior Authorization for titration sent to [facility] via web portal.  Notification or Prior Authorization is not required for the requested services  Decision ID #:[id number]

## 2021-03-28 NOTE — Telephone Encounter (Signed)
Patient is scheduled for CPAP Titration on 05/07/21. Patient understands his titration study will be done at [facility] sleep lab. Patient understands she will receive a letter in a week or so detailing appointment, date, time, and location. Patient understands to call if she does not receive the letter  in a timely manner. Patient agrees with treatment and thanked me for call.

## 2021-05-07 ENCOUNTER — Ambulatory Visit (HOSPITAL_BASED_OUTPATIENT_CLINIC_OR_DEPARTMENT_OTHER): Payer: Medicare Other | Admitting: Cardiology

## 2021-05-23 ENCOUNTER — Ambulatory Visit (HOSPITAL_BASED_OUTPATIENT_CLINIC_OR_DEPARTMENT_OTHER): Payer: Medicare Other | Attending: Cardiology | Admitting: Cardiology

## 2021-05-23 ENCOUNTER — Other Ambulatory Visit: Payer: Self-pay

## 2021-05-23 DIAGNOSIS — I10 Essential (primary) hypertension: Secondary | ICD-10-CM | POA: Insufficient documentation

## 2021-05-23 DIAGNOSIS — G4733 Obstructive sleep apnea (adult) (pediatric): Secondary | ICD-10-CM | POA: Diagnosis not present

## 2021-05-23 DIAGNOSIS — E669 Obesity, unspecified: Secondary | ICD-10-CM | POA: Insufficient documentation

## 2021-05-23 DIAGNOSIS — Z6841 Body Mass Index (BMI) 40.0 and over, adult: Secondary | ICD-10-CM | POA: Insufficient documentation

## 2021-05-23 DIAGNOSIS — R0683 Snoring: Secondary | ICD-10-CM | POA: Insufficient documentation

## 2021-05-23 DIAGNOSIS — R451 Restlessness and agitation: Secondary | ICD-10-CM | POA: Diagnosis not present

## 2021-06-04 NOTE — Procedures (Signed)
° °  Patient Name: Ann Castillo, Ann Castillo Date: 05/23/2021 Gender: Female D.O.B: [birth year] Age (years): 24 Referring Provider: Armanda Magic MD, ABSM Height (inches): 64 Interpreting Physician: Armanda Magic MD, ABSM Weight (lbs): 261 RPSGT: Ulyess Mort BMI: 45 MRN: [id number] Neck Size: 16.00  CLINICAL INFORMATION The patient is referred for a CPAP titration to treat sleep apnea.  SLEEP STUDY TECHNIQUE As per the AASM Manual for the Scoring of Sleep and Associated Events v2.3 (April 2016) with a hypopnea requiring 4% desaturations.  The channels recorded and monitored were frontal, central and occipital EEG, electrooculogram (EOG), submentalis EMG (chin), nasal and oral airflow, thoracic and abdominal wall motion, anterior tibialis EMG, snore microphone, electrocardiogram, and pulse oximetry. Continuous positive airway pressure (CPAP) was initiated at the beginning of the study and titrated to treat sleep-disordered breathing.  MEDICATIONS Medications self-administered by patient taken the night of the study : N/A  TECHNICIAN COMMENTS Comments added by technician: Patient sleep in a recliner chair. Patient was ordered as a cpap titration. Comments added by scorer: N/A  RESPIRATORY PARAMETERS Optimal PAP Pressure (cm): 15  AHI at Optimal Pressure (/hr):0 Overall Minimal O2 (%):90.0  Supine % at Optimal Pressure (%):0 Minimal O2 at Optimal Pressure (%): 94.0   SLEEP ARCHITECTURE The study was initiated at 11:04:40 PM and ended at 4:59:53 AM.  Sleep onset time was 14.6 minutes and the sleep efficiency was 78.7%. The total sleep time was 279.5 minutes.  The patient spent 10.7% of the night in stage N1 sleep, 89.1% in stage N2 sleep, 0.% in stage N3 and 0% in REM.Stage REM latency was N/A minutes  Wake after sleep onset was 61.1. Alpha intrusion was absent. Supine sleep was 0.00%.  CARDIAC DATA The 2 lead EKG demonstrated sinus rhythm. The mean heart rate was 51.9 beats  per minute. Other EKG findings include: PVCs.  LEG MOVEMENT DATA The total Periodic Limb Movements of Sleep (PLMS) were 0. The PLMS index was 0.0. A PLMS index of <15 is considered normal in adults.  IMPRESSIONS - The optimal PAP pressure was 15 cm of water. - Significant oxygen desaturations were not observed during this titration (min O2 = 90.0%). - The patient snored with soft snoring volume during this titration study. - 2-lead EKG demonstrated: PVCs - Clinically significant periodic limb movements were not noted during this study. Arousals associated with PLMs were significant.  DIAGNOSIS - Obstructive Sleep Apnea (G47.33)  RECOMMENDATIONS - Trial of CPAP therapy on 15 cm H2O with a Medium size Resmed Full Face Mask AirFit F20 mask and heated humidification. - Avoid alcohol, sedatives and other CNS depressants that may worsen sleep apnea and disrupt normal sleep architecture. - Sleep hygiene should be reviewed to assess factors that may improve sleep quality. - Weight management and regular exercise should be initiated or continued. - Return to Sleep Center for re-evaluation after 6 weeks of therapy  [Electronically signed] 06/04/2021 06:29 PM  Armanda Magic MD, ABSM Diplomate, American Board of Sleep Medicine

## 2021-06-20 ENCOUNTER — Telehealth: Payer: Self-pay | Admitting: Pharmacist

## 2021-06-20 NOTE — Telephone Encounter (Signed)
Patient called office stating her healthwell grant needed to be renewed.  Patient's healthwell grant renewed  CARD NO. [id number]   CARD STATUS Active   BIN [id number]   PCN [id number]   PC GROUP [id number]  Info called into [facility]. Pt made aware. She was thankful for the assistance.

## 2021-06-26 ENCOUNTER — Telehealth: Payer: Self-pay | Admitting: *Deleted

## 2021-06-26 NOTE — Telephone Encounter (Signed)
The patient has been notified of the result and verbalized understanding.  All questions (if any) were answered. Ann Castillo, [place] 06/26/2021 4:38 PM    Upon patient request DME selection is [facility] Patient understands he will be contacted by [facility] to set up his cpap. Patient understands to call if [facility] does not contact him with new setup in a timely manner. Patient understands they will be called once confirmation has been received from [employer]/ that they have received their new machine to schedule 10 week follow up appointment.   [facility] notified of new cpap order  Please add to airview Patient was grateful for the call and thanked me.

## 2021-06-26 NOTE — Telephone Encounter (Signed)
-----   Message from Quintella Reichert, MD sent at 06/04/2021  6:31 PM EST ----- Please let patient know that they had a successful PAP titration and let DME know that orders are in EPIC.  Please set up 6 week OV with me.

## 2021-07-04 ENCOUNTER — Ambulatory Visit: Payer: Medicare Other | Admitting: Physician Assistant

## 2021-07-07 ENCOUNTER — Telehealth: Payer: Self-pay | Admitting: Cardiology

## 2021-07-07 NOTE — Telephone Encounter (Signed)
Will route this message to Dr. Theodosia Blender sleep study coordinator to further follow-up with the pt about sleep study results and equipment.

## 2021-07-07 NOTE — Telephone Encounter (Signed)
Patient was contacted by the company to start the process to get her CPAP Machine. She does not want to order her machine until she gets the results of her Sleep Study.  She was not sure if she needs to make an appt to see Dr. Mayford Knife or if the results could be given to her over the phone.   Please advise

## 2021-07-12 NOTE — Telephone Encounter (Signed)
Patient in sleep coordinators office today and made an appointment to see dr Mayford Knife 08-04-21 at 8:40. Patient agrees with treatment.

## 2021-07-17 ENCOUNTER — Other Ambulatory Visit: Payer: Self-pay | Admitting: Cardiology

## 2021-07-18 MED ORDER — NEXLETOL 180 MG PO TABS: 1.0000 | ORAL_TABLET | Freq: Every day | ORAL | 8 refills | Status: DC

## 2021-07-19 ENCOUNTER — Ambulatory Visit: Payer: Medicare Other | Admitting: Physician Assistant

## 2021-07-26 ENCOUNTER — Other Ambulatory Visit: Payer: Self-pay | Admitting: Physician Assistant

## 2021-07-27 NOTE — Telephone Encounter (Signed)
Has appt with [facility] 3/8. Based on last RF has enough medication.  ?

## 2021-08-02 ENCOUNTER — Encounter: Payer: Self-pay | Admitting: Physician Assistant

## 2021-08-02 ENCOUNTER — Ambulatory Visit (INDEPENDENT_AMBULATORY_CARE_PROVIDER_SITE_OTHER): Payer: Medicare Other | Admitting: Physician Assistant

## 2021-08-02 ENCOUNTER — Other Ambulatory Visit: Payer: Self-pay

## 2021-08-02 DIAGNOSIS — F331 Major depressive disorder, recurrent, moderate: Secondary | ICD-10-CM

## 2021-08-02 DIAGNOSIS — Z560 Unemployment, unspecified: Secondary | ICD-10-CM

## 2021-08-02 DIAGNOSIS — Z6379 Other stressful life events affecting family and household: Secondary | ICD-10-CM

## 2021-08-02 DIAGNOSIS — E6609 Other obesity due to excess calories: Secondary | ICD-10-CM

## 2021-08-02 MED ORDER — ESCITALOPRAM OXALATE 20 MG PO TABS: 40.0000 mg | ORAL_TABLET | Freq: Every day | ORAL | 0 refills | Status: DC

## 2021-08-02 NOTE — Progress Notes (Signed)
[facility] Med Check ? ?Patient ID: Ann Castillo,  ?MRN: [id number] ? ?PCP: [place], Sarah T, MD ? ?Date of Evaluation: 08/02/2021 ?Time spent:30 minutes ? ?Chief Complaint:  ?Chief Complaint   ?Depression; Follow-up; Anxiety ?  ? ? ?HISTORY/CURRENT STATUS: ?HPI Routine Med check ? ?She was fine until a month ago. 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 And her husband has been having RUQ pain, then found out he has lesions on his liver, awaiting bx. His dr. Is very concerned that it's stage IV CA, he had melanoma 17 years ago.  ? ?Her speech is getting bad again, (chronic Lyme Disease with neuro deficits) not thinking straight, reports her arms flail involuntarily at times, gets off balance but hasn't fallen. "I never know when these things are Castillo to hit me."  Sees an Integrative Dr. for the chronic Lyme, has tried to get in sooner than her scheduled appt in June but no appt is available.  ? ?Doesn't want to do anything. Energy and motivation are low most days. ADLs and personal hygiene are ok. Sleeps fairly well. Does cry easily at times. Eating a lot of unhealthy things. Can't focus to read.  Distracted easily. No SI/HI. ? ?Patient denies increased energy with decreased need for sleep, no increased talkativeness, no racing thoughts, no impulsivity or risky behaviors, no increased spending, no increased libido, no grandiosity, no increased irritability or anger, no paranoia, and no hallucinations. ? ?Complains of dizziness, tingling in her arms and legs that come and go, imbalance but no falls, and abnormal speech, mostly trouble finding words. Easily confused. Arms thrash uncontrollably sometimes, unsure how long it lasts. Not long. Feels achy all over.  ? ?Individual Medical History/ Review of Systems: Changes? :Yes    see HPI ? ?Past medications for mental health diagnoses include: ?Lexapro, trazodone,  loxapine, Ambien, Klonopin, melatonin  ? ?Allergies: Celecoxib, Codeine, Estrogenic substance, Hydromorphone hcl, Latex, Pravastatin sodium, Rosuvastatin, Codeine phosphate, Dilaudid [hydromorphone hcl], Estrogen, Pravastatin sodium, and Zetia [ezetimibe] ? ?Current Medications:  ?Current Outpatient Medications:  ?  Barberry-Oreg Grape-Goldenseal (BERBERINE COMPLEX PO), Take 500 mg by mouth as directed., Disp: , Rfl:  ?  Bempedoic Acid (NEXLETOL) 180 MG TABS, Take 1 tablet by mouth daily., Disp: 30 tablet, Rfl: 8 ?  Cholecalciferol (VITAMIN D3 PO), Take 5,000 Units by mouth 2 (two) times daily., Disp: , Rfl:  ?  Chromium Picolinate 500 MCG CAPS, Take 500 mcg by mouth as directed., Disp: , Rfl:  ?  CINNAMON PO, Take 250 mg by mouth daily., Disp: , Rfl:  ?  LECITHIN PO, Take by mouth., Disp: , Rfl:  ?  lisinopril (ZESTRIL) 10 MG tablet, Take 1 tablet (10 mg total) by mouth daily., Disp: 90 tablet, Rfl: 3 ?  magnesium gluconate (MAGONATE) 500 MG tablet, Take 500 mg by mouth daily., Disp: , Rfl:  ?  MELATONIN PO, Take by mouth., Disp: , Rfl:  ?  meloxicam (MOBIC) 15 MG tablet, Take 15 mg by mouth daily as needed for pain. , Disp: , Rfl:  ?  Milk Thistle 150 MG CAPS, Take 150 mg by mouth 2 (two) times daily., Disp: , Rfl:  ?  OVER THE COUNTER MEDICATION, GENTLE MOVE STOOL SOFTENER BLEND, Disp: , Rfl:  ?  OVER THE COUNTER MEDICATION, curcumin, Disp: , Rfl:  ?  REPATHA SURECLICK [id number] MG/ML SOAJ, INJECT 1 PEN INTO THE SKIN  EVERY 14 (FOURTEEN) DAYS., Disp: 6 mL, Rfl: 3 ?  UNABLE TO FIND, 10 mg as needed. Med Name: CBD oil, Disp: , Rfl:  ?  vitamin B-12 (CYANOCOBALAMIN) 100 MCG tablet, Take 100 mcg by mouth daily., Disp: , Rfl:  ?  aspirin EC 81 MG tablet, Take 1 tablet (81 mg total) by mouth daily. Swallow whole. (Patient not taking: Reported on 08/04/2021), Disp: 90 tablet, Rfl: 3 ?  escitalopram (LEXAPRO) 20 MG tablet, Take 2 tablets (40 mg total) by mouth daily., Disp: 180 tablet, Rfl: 0 ?  OVER THE COUNTER MEDICATION,  Source of life gold tablets, Disp: , Rfl:  ?  OVER THE COUNTER MEDICATION, adaptocrine, Disp: , Rfl:  ?  OVER THE COUNTER MEDICATION, L Glutamine 750 mg, Disp: , Rfl:  ?  OVER THE COUNTER MEDICATION, Digestive enzyme, Disp: , Rfl:  ?  OVER THE COUNTER MEDICATION, MONOLAURIN 500 MG, Disp: , Rfl:  ?  OVER THE COUNTER MEDICATION, Joint binder, Disp: , Rfl:  ?Medication Side Effects: none ? ?Family Medical/ Social History: Changes?  see above ? ?MENTAL HEALTH EXAM: ? ?Last menstrual period 12/26/2012.There is no height or weight on file to calculate BMI.  ?General Appearance: Casual, Neat, Well Groomed and Obese  ?Eye Contact:  Good  ?Speech:  Blocked and Clear and Coherent  ?Volume:  Normal  ?Mood:  Euthymic  ?Affect:  Appropriate  ?Thought Process:  Goal Directed and Descriptions of Associations: Circumstantial  ?Orientation:  Full (Time, Place, and Person)  ?Thought Content: Logical   ?Suicidal Thoughts:  No  ?Homicidal Thoughts:  No  ?Memory:  WNL  ?Judgement:  Good  ?Insight:  Good  ?Psychomotor Activity:  Normal  ?Concentration:  Concentration: Fair and Attention Span: Poor  ?Recall:  Good  ?Fund of Knowledge: Good  ?Language: Good  ?Assets:  Desire for Improvement  ?ADL's:  Intact  ?Cognition: WNL  ?Prognosis:  Good  ? ? ?DIAGNOSES:  ?  ICD-10-CM   ?1. Major depressive disorder, recurrent episode, moderate (HCC)  F33.1   ?  ?2. Stress due to illness of family member  Z63.79   ?  ?3. Loss of job  Z56.0   ?  ?4. Obesity due to excess calories, unspecified classification, unspecified whether serious comorbidity present  E66.09   ?  ? ? ? ? ?Receiving Psychotherapy: No  saw Artemio Aly in the past.  ? ? ?RECOMMENDATIONS:  ?PDMP reviewed.  No results are available. ?I provided 30  minutes of face to face time during this encounter, including time spent before and after the visit in records review, medical decision making, counseling pertinent to today's visit, and charting.  ?Recommend increasing the Lexapro. Usual  FDA max is 20 mg, but sometimes increasing to 40 mg is necessary, per Dr. Phillips Climes Prescribers Guide. She understands and we'll watch for SE, especially increased sweating. She would like to increase it. ? ?Increase Lexapro to 40 mg qd. ?Continue Melatonin 3 mg qhs prn. ?Get in to see her PCP ASAP, or go to ER if sx of CVA recur. She verbalizes understanding.  ?Restart therapy. ?Return in 6 weeks. ? ?Donnal Moat, PA-C  ? ? ?

## 2021-08-04 ENCOUNTER — Ambulatory Visit: Payer: Medicare Other | Admitting: Cardiology

## 2021-08-04 ENCOUNTER — Encounter: Payer: Self-pay | Admitting: Cardiology

## 2021-08-04 ENCOUNTER — Other Ambulatory Visit: Payer: Self-pay

## 2021-08-04 VITALS — BP 117/72 | HR 73 | Ht 64.0 in | Wt 278.6 lb

## 2021-08-04 DIAGNOSIS — G4733 Obstructive sleep apnea (adult) (pediatric): Secondary | ICD-10-CM

## 2021-08-04 DIAGNOSIS — I10 Essential (primary) hypertension: Secondary | ICD-10-CM

## 2021-08-04 DIAGNOSIS — I6521 Occlusion and stenosis of right carotid artery: Secondary | ICD-10-CM

## 2021-08-04 DIAGNOSIS — E7800 Pure hypercholesterolemia, unspecified: Secondary | ICD-10-CM

## 2021-08-04 DIAGNOSIS — R55 Syncope and collapse: Secondary | ICD-10-CM

## 2021-08-04 NOTE — Addendum Note (Signed)
Addended by: Antonieta Iba on: 08/04/2021 09:11 AM ? ? Modules accepted: Orders ? ?

## 2021-08-04 NOTE — Patient Instructions (Signed)
Medication Instructions:  ?Your physician recommends that you continue on your current medications as directed. Please refer to the Current Medication list given to you today. ? ?*If you need a refill on your cardiac medications before your next appointment, please call your pharmacy* ? ? ?Testing/Procedures: ?Dr. Mayford Knife has ordered an overnight pulse oximeter for you to wear while on CPAP.  ? ? ?Follow-Up: ?At [facility], you and your health needs are our priority.  As part of our continuing mission to provide you with exceptional heart care, we have created designated Provider Care Teams.  These Care Teams include your primary Cardiologist (physician) and Advanced Practice Providers (APPs -  Physician Assistants and Nurse Practitioners) who all work together to provide you with the care you need, when you need it. ? ?Your next appointment:   ?6 weeks after you receive your new machine.  ? ?The format for your next appointment:   ?In Person ? ?Provider:   ?Armanda Magic, MD   ? ? ?

## 2021-08-04 NOTE — Progress Notes (Signed)
Cardiology  Note    Date:  08/04/2021   ID:  Oleen, Okoli [birth year], MRN [id number]  PCP:  Martinique, Sarah T, MD  Cardiologist:  Fransico Him, MD   Chief Complaint  Patient presents with   Follow-up    Syncope, HTN, carotid stenosis, HLD, OSA     History of Present Illness:   This is a 61yo female with a hx of GERD, Obesity, HLD, HTN who was initially referred for evaluation of HTN and syncope. 2D echo showed normal LVF with EF 55-60%, mild RVE with normal RVF, trivial MR and no AS.  Carotid dopplers showed 1-39% right carotid stenosis. Heart monitor showed no bradyarrhythmias or tachyarrhythmias.  Chest CTA with no PE. Sleep study showed mild OSA with severe nocturnal hypoxemia and CPAP titration was recommended and ultimately was titrated to CPAP 15 cm of H2O but has not started it because she had a lot of questions.   She is here today for followup and is doing well.  She has chronic DOE related to obesity that occurs mainly when walking 5 miles and when going up stairs.  She denies any chest pain or pressure, PND, orthopnea, LE edema, dizziness, palpitations or syncope. She is compliant with her meds and is tolerating meds with no SE.     Past Medical History:  Diagnosis Date   Anemia, unspecified    Anxiety    Carotid stenosis, right    1-39% by doppler 10/2019   Cavus deformity of foot, acquired    Depression    Dysthymic disorder    Edema    bilateral   Esophageal reflux    Essential hypertension, benign    Hx pulmonary embolism 2006   was on estrogen    Insomnia, unspecified    Major depressive disorder, single episode, unspecified    Migraine, unspecified, without mention of intractable migraine without mention of status migrainosus    Myalgia and myositis, unspecified    Obesity, unspecified    Other and unspecified hyperlipidemia    Polycystic ovaries    Unspecified gastritis and gastroduodenitis without mention of hemorrhage     Past Surgical  History:  Procedure Laterality Date   no surgical history      Current Medications: Current Meds  Medication Sig   Barberry-Oreg Grape-Goldenseal (BERBERINE COMPLEX PO) Take 500 mg by mouth as directed.   Bempedoic Acid (NEXLETOL) 180 MG TABS Take 1 tablet by mouth daily.   Cholecalciferol (VITAMIN D3 PO) Take 5,000 Units by mouth 2 (two) times daily.   Chromium Picolinate 500 MCG CAPS Take 500 mcg by mouth as directed.   CINNAMON PO Take 250 mg by mouth daily.   escitalopram (LEXAPRO) 20 MG tablet Take 2 tablets (40 mg total) by mouth daily.   LECITHIN PO Take by mouth.   lisinopril (ZESTRIL) 10 MG tablet Take 1 tablet (10 mg total) by mouth daily.   magnesium gluconate (MAGONATE) 500 MG tablet Take 500 mg by mouth daily.   MELATONIN PO Take by mouth.   meloxicam (MOBIC) 15 MG tablet Take 15 mg by mouth daily as needed for pain.    Milk Thistle 150 MG CAPS Take 150 mg by mouth 2 (two) times daily.   OVER THE COUNTER MEDICATION Source of life gold tablets   OVER THE COUNTER MEDICATION adaptocrine   OVER THE COUNTER MEDICATION GENTLE MOVE STOOL SOFTENER BLEND   OVER THE COUNTER MEDICATION L Glutamine 750 mg   OVER THE COUNTER MEDICATION Digestive  enzyme   OVER THE COUNTER MEDICATION MONOLAURIN 500 MG   OVER THE COUNTER MEDICATION Joint binder   OVER THE COUNTER MEDICATION curcumin   REPATHA SURECLICK [id number] MG/ML SOAJ INJECT 1 PEN INTO THE SKIN EVERY 14 (FOURTEEN) DAYS.   UNABLE TO FIND 10 mg as needed. Med Name: CBD oil   vitamin B-12 (CYANOCOBALAMIN) 100 MCG tablet Take 100 mcg by mouth daily.    Allergies:   Celecoxib, Codeine, Estrogenic substance, Hydromorphone hcl, Latex, Pravastatin sodium, Rosuvastatin, Codeine phosphate, Dilaudid [hydromorphone hcl], Estrogen, Pravastatin sodium, and Zetia [ezetimibe]   Social History   Socioeconomic History   Marital status: Married    Spouse name: Not on file   Number of children: 3   Years of education: College   Highest education  level: Not on file  Occupational History   Occupation: [employer]    Employer: [employer]  Tobacco Use   Smoking status: Never   Smokeless tobacco: Never  Vaping Use   Vaping Use: Never used  Substance and Sexual Activity   Alcohol use: Yes    Alcohol/week: 0.0 - 1.0 standard drinks   Drug use: No   Sexual activity: Not Currently    Comment: 1st intercourse- 20, partners- 1, married- 25 yr s  Other Topics Concern   Not on file  Social History Narrative   Married, works as a Radio broadcast assistant.  She has significant stress from daughter, who is mentally ill and lives at home (age 43).     Caffeine: very rarely    Social Determinants of Radio broadcast assistant Strain: Not on file  Food Insecurity: Not on file  Transportation Needs: Not on file  Physical Activity: Not on file  Stress: Not on file  Social Connections: Not on file     Family History:  The patient's family history includes ADD / ADHD in her daughter and daughter; Alzheimer's disease in her maternal uncle, mother, and paternal grandfather; Breast cancer in her maternal aunt and maternal grandmother; Diabetes in her father; Gallbladder disease in her father; Heart attack in her father; Heart disease in her father; Hypertension in her son; Learning disabilities in her daughter; Liver cancer in her father; Miscarriages / Stillbirths in her mother; Pancreatic cancer in her father; Personality disorder in her daughter; Tourette syndrome in her son.   ROS:   Please see the history of present illness.    ROS All other systems reviewed and are negative.  No flowsheet data found.   PHYSICAL EXAM:   VS:  BP 117/72    Pulse 73    Ht 5\' 4"  (1.626 m)    Wt 278 lb 9.6 oz (126.4 kg)    LMP 12/26/2012    SpO2 97%    BMI 47.82 kg/m    GEN: Well nourished, well developed in no acute distress HEENT: Normal NECK: No JVD; No carotid bruits LYMPHATICS: No lymphadenopathy CARDIAC:RRR, no murmurs, rubs, gallops RESPIRATORY:  Clear  to auscultation without rales, wheezing or rhonchi  ABDOMEN: Soft, non-tender, non-distended MUSCULOSKELETAL:  No edema; No deformity  SKIN: Warm and dry NEUROLOGIC:  Alert and oriented x 3 PSYCHIATRIC:  Normal affect   Wt Readings from Last 3 Encounters:  08/04/21 278 lb 9.6 oz (126.4 kg)  05/23/21 261 lb (118.4 kg)  02/22/21 267 lb 12.8 oz (121.5 kg)      Studies/Labs Reviewed:   EKG:  EKG is ordered today and showed sinus bradycardia at 54bpm with nonspecific T wave abnormality  Recent Labs:  09/26/2020: BUN 18; Creatinine, Ser 0.82; Potassium 3.8; Sodium 141 02/24/2021: ALT 50    Lipid Panel    Component Value Date/Time   CHOL 129 02/24/2021 1039   TRIG 148 02/24/2021 1039   HDL 43 02/24/2021 1039   CHOLHDL 3.0 02/24/2021 1039   CHOLHDL 4.2 07/16/2011 1023   VLDL 28 07/16/2011 1023   LDLCALC 60 02/24/2021 1039   LDLDIRECT 181.4 02/14/2009 0000    Additional studies/ records that were reviewed today include:  OV notes ASSESSMENT:    1. Syncope and collapse   2. Essential hypertension, benign   3. Carotid stenosis, right   4. Pure hypercholesterolemia   5. OSA (obstructive sleep apnea)      PLAN:  In order of problems listed above:  1.  Syncope -Her symptoms were not clear cut and could not discern whether had  true syncope -some of her falls were related to tripping while other times it sounded like presyncope or syncope -orthostatic BPs were normal but soft so diuretic part of Lisinopril HCT was stopped and continued on Lisinipril 10mg  daily -2D echo with normal LVF and no valvular disease -carotid dopplers with 1-39% right carotid stenosis -no pauses or arrhythmias on event monitor -chest CTA with no PE -She has not had any further syncopal episodes since I saw her last -consider ILR if she has any further presyncope or syncope  2.  HTN -BP is controlled on exam today -Continue prescription drug management lisinopril 10 mg daily with as needed  refills  3.  Carotid stenosis -dopplers showed 1-39% right -repeat dopplers 10/2021 -Continue Repatha and bempedoic acid as well as aspirin 81 mg daily with as needed refills  4.  HLD -LDL goal < 70 due to carotid stenosis -I have personally reviewed and interpreted outside labs performed by patient's PCP which showed LDL 60, HDL 43, total cholesterol 129, triglycerides 148 and ALT 50 on 02/24/2021-she is statin intolerant -Continue prescription drug management with bempedoic acid 180 mg daily and Repatha with as needed refills  5.  RV dilatation/OSA/Nocturnal hypoxemia -mild on echo 2021 -no PE on CTA 2021 -mild OSA on sleep study with AHI 8.6/hr and nocturnal hypoxemia on sleep study 12/2019 -We discussed the results of the sleep study and the severity of her nocturnal hypoxemia that she has -I am very concerned that she has RVE on echo and the severity of nocturnal hypoxemia over time may lead to pulmonary HTN -I have recommended starting on ResMed  auto CPAP from 4 to 15cm H2O with heated humidity and mask of choice -I will see her back in 6 weeks after she gets her device   Medication Adjustments/Labs and Tests Ordered: Current medicines are reviewed at length with the patient today.  Concerns regarding medicines are outlined above.  Medication changes, Labs and Tests ordered today are listed in the Patient Instructions below.  There are no Patient Instructions on file for this visit.   Signed, Fransico Him, MD  08/04/2021 8:48 AM    [address], [place], [place]  [address] Phone: [phone]; Fax: [phone]

## 2021-08-09 ENCOUNTER — Other Ambulatory Visit: Payer: Self-pay | Admitting: Physician Assistant

## 2021-09-18 ENCOUNTER — Ambulatory Visit: Payer: Medicare Other | Admitting: Cardiology

## 2021-09-20 ENCOUNTER — Ambulatory Visit: Payer: Medicare Other | Admitting: Physician Assistant

## 2021-09-27 ENCOUNTER — Ambulatory Visit (INDEPENDENT_AMBULATORY_CARE_PROVIDER_SITE_OTHER): Payer: Medicare Other | Admitting: Physician Assistant

## 2021-09-27 ENCOUNTER — Encounter: Payer: Self-pay | Admitting: Physician Assistant

## 2021-09-27 DIAGNOSIS — E6609 Other obesity due to excess calories: Secondary | ICD-10-CM

## 2021-09-27 DIAGNOSIS — F3342 Major depressive disorder, recurrent, in full remission: Secondary | ICD-10-CM | POA: Diagnosis not present

## 2021-09-27 DIAGNOSIS — G4700 Insomnia, unspecified: Secondary | ICD-10-CM

## 2021-09-27 MED ORDER — ESCITALOPRAM OXALATE 20 MG PO TABS: 30.0000 mg | ORAL_TABLET | Freq: Every day | ORAL | Status: DC

## 2021-09-27 MED ORDER — ESCITALOPRAM OXALATE 20 MG PO TABS: 20.0000 mg | ORAL_TABLET | Freq: Every day | ORAL | Status: DC

## 2021-09-27 MED ORDER — ESCITALOPRAM OXALATE 10 MG PO TABS: 10.0000 mg | ORAL_TABLET | Freq: Every day | ORAL | Status: DC

## 2021-09-27 NOTE — Progress Notes (Signed)
[facility] ? ?Patient ID: Ann Castillo,  ?MRN: [id number] ? ?PCP: Martinique, Sarah T, MD ? ?Date of Evaluation: 09/27/2021 ?Time spent:20 minutes ? ?Chief Complaint:  ?Chief Complaint   ?Depression; Follow-up; Insomnia ?  ? ? ?HISTORY/CURRENT STATUS: ?HPI Routine Med check ? ?Doing much better. Her husband got good news, lesions on his liver were benign.  The stress and anxiety that caused is gone now which has really helped the depression and anxiety.  She did increase the Lexapro to 40 mg as we had discussed but after a week or so, once they found out that his results were negative she decided to go back to 30 mg total.  Feels like that is a good dose for her and does not need to be on a higher dose. ? 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  She hopes to start back up in the summer but not exactly sure. ? ?Her only complaint right now is sometimes not being able to go to sleep.  She will sometimes take Tylenol PM which does help her go to sleep and helps with some of the pain she has from chronic Lyme disease but she has weird dreams that are disturbing and then she does not wake up refreshed as she hoped.  She does not drink caffeine.  She gets off her electronic devices about 11:40 PM and then tries to go to sleep. ? ?Patient denies loss of interest in usual activities and is able to enjoy things.  Denies decreased energy.  Denies decreased motivation.   ADLs and personal hygiene are normal.  Appetite has not changed.  No extreme sadness, tearfulness, or feelings of hopelessness.  Denies any changes in concentration, making decisions or remembering things. Denies suicidal or homicidal thoughts. ? ?Patient denies increased energy with decreased need for sleep, no increased talkativeness, no racing thoughts, no impulsivity or risky behaviors, no increased spending, no increased libido, no grandiosity, no increased irritability or anger, and  no hallucinations. ? ?Denies dizziness, syncope, seizures, numbness, tingling, tremor, tics, unsteady gait, slurred speech, confusion. Denies muscle or joint pain, stiffness, or dystonia. ? ?Individual Medical History/ Review of Systems: Changes? :No     ? ?Past medications for mental health diagnoses include: ?Lexapro, trazodone, loxapine, Ambien, Klonopin, melatonin  ? ?Allergies: Celecoxib, Codeine, Estrogenic substance, Hydromorphone hcl, Latex, Pravastatin sodium, Rosuvastatin, Codeine phosphate, Dilaudid [hydromorphone hcl], Estrogen, Pravastatin sodium, and Zetia [ezetimibe] ? ?Current Medications:  ?Current Outpatient Medications:  ?  Barberry-Oreg Grape-Goldenseal (BERBERINE COMPLEX PO), Take 500 mg by mouth as directed., Disp: , Rfl:  ?  Bempedoic Acid (NEXLETOL) 180 MG TABS, Take 1 tablet by mouth daily., Disp: 30 tablet, Rfl: 8 ?  Cholecalciferol (VITAMIN D3 PO), Take 5,000 Units by mouth 2 (two) times daily., Disp: , Rfl:  ?  Chromium Picolinate 500 MCG CAPS, Take 500 mcg by mouth as directed., Disp: , Rfl:  ?  CINNAMON PO, Take 250 mg by mouth daily., Disp: , Rfl:  ?  co-enzyme Q-10 30 MG capsule, Take 30 mg by mouth 3 (three) times daily., Disp: , Rfl:  ?  escitalopram (LEXAPRO) 10 MG tablet, Take 1 tablet (10 mg total) by mouth daily., Disp: , Rfl:  ?  LECITHIN PO, Take by mouth., Disp: , Rfl:  ?  lisinopril (ZESTRIL) 10 MG tablet, Take 1 tablet (10 mg total) by mouth daily., Disp: 90 tablet, Rfl: 3 ?  magnesium gluconate (MAGONATE) 500  MG tablet, Take 500 mg by mouth daily., Disp: , Rfl:  ?  MELATONIN PO, Take by mouth., Disp: , Rfl:  ?  meloxicam (MOBIC) 15 MG tablet, Take 15 mg by mouth daily as needed for pain. , Disp: , Rfl:  ?  Milk Thistle 150 MG CAPS, Take 150 mg by mouth 2 (two) times daily., Disp: , Rfl:  ?  OVER THE COUNTER MEDICATION, GENTLE MOVE STOOL SOFTENER BLEND, Disp: , Rfl:  ?  OVER THE COUNTER MEDICATION, curcumin, Disp: , Rfl:  ?  REPATHA SURECLICK XX123456 MG/ML SOAJ, INJECT 1 PEN  INTO THE SKIN EVERY 14 (FOURTEEN) DAYS., Disp: 6 mL, Rfl: 3 ?  UNABLE TO FIND, 10 mg as needed. Med Name: CBD oil, Disp: , Rfl:  ?  vitamin B-12 (CYANOCOBALAMIN) 100 MCG tablet, Take 100 mcg by mouth daily., Disp: , Rfl:  ?  aspirin EC 81 MG tablet, Take 1 tablet (81 mg total) by mouth daily. Swallow whole. (Patient not taking: Reported on 08/04/2021), Disp: 90 tablet, Rfl: 3 ?  escitalopram (LEXAPRO) 20 MG tablet, Take 1 tablet (20 mg total) by mouth daily., Disp: , Rfl:  ?  OVER THE COUNTER MEDICATION, Source of life gold tablets (Patient not taking: Reported on 09/27/2021), Disp: , Rfl:  ?  OVER THE COUNTER MEDICATION, adaptocrine (Patient not taking: Reported on 09/27/2021), Disp: , Rfl:  ?  OVER THE COUNTER MEDICATION, L Glutamine 750 mg (Patient not taking: Reported on 09/27/2021), Disp: , Rfl:  ?  OVER THE COUNTER MEDICATION, Digestive enzyme (Patient not taking: Reported on 09/27/2021), Disp: , Rfl:  ?  OVER THE COUNTER MEDICATION, MONOLAURIN 500 MG (Patient not taking: Reported on 09/27/2021), Disp: , Rfl:  ?  OVER THE COUNTER MEDICATION, Joint binder (Patient not taking: Reported on 09/27/2021), Disp: , Rfl:  ?Medication Side Effects: none ? ?Family Medical/ Social History: Changes?  see above ? ?MENTAL HEALTH EXAM: ? ?Last menstrual period 12/26/2012.There is no height or weight on file to calculate BMI.  ?General Appearance: Casual, Neat, Well Groomed and Obese  ?Eye Contact:  Good  ?Speech:  Clear and Coherent and Normal Rate  ?Volume:  Normal  ?Mood:  Euthymic  ?Affect:  Appropriate  ?Thought Process:  Goal Directed and Descriptions of Associations: Circumstantial  ?Orientation:  Full (Time, Place, and Person)  ?Thought Content: Logical   ?Suicidal Thoughts:  No  ?Homicidal Thoughts:  No  ?Memory:  WNL  ?Judgement:  Good  ?Insight:  Good  ?Psychomotor Activity:  Normal  ?Concentration:  Concentration: Good and Attention Span: Good  ?Recall:  Good  ?Fund of Knowledge: Good  ?Language: Good  ?Assets:  Desire for  Improvement  ?ADL's:  Intact  ?Cognition: WNL  ?Prognosis:  Good  ? ? ?DIAGNOSES:  ?  ICD-10-CM   ?1. Major depression, recurrent, full remission ([place])  F33.42   ?  ?2. Obesity due to excess calories, unspecified classification, unspecified whether serious comorbidity present  E66.09   ?  ?3. Insomnia, unspecified type  G47.00   ?  ? ? ?Receiving Psychotherapy: No   ? ? ?RECOMMENDATIONS:  ?PDMP reviewed.  No results are available. ?I provided 20 minutes of face to face time during this encounter, including time spent before and after the visit in records review, medical decision making, counseling pertinent to today's visit, and charting.  ?Doing well now. No changes in meds.  ?I am glad to see her doing better! ?We discussed the insomnia.  She does take melatonin sometimes which helps  her stay asleep but not go to sleep.  Recommend Blue Blocker glasses.  Also get off electronic devices within 2 hours of the time that she wants to go to sleep, if at all possible.  But the glasses will help.  Also recommend instead of taking Tylenol PM, take Tylenol separately if she needs it for pain and then get Benadryl separately take 6.25 to 12.5 mg may be enough to help her go to sleep if she needs help with the medicine.  She verbalizes understanding. ? ?Continue Lexapro 20 mg +10 mg daily. ?Continue Melatonin 3 mg qhs prn. ?Continue vitamins and supplements from her integrative doctor. ?Return in 6 months. ? ?Donnal Moat, PA-C  ? ? ?

## 2021-10-19 ENCOUNTER — Other Ambulatory Visit: Payer: Self-pay | Admitting: Physician Assistant

## 2021-10-24 ENCOUNTER — Other Ambulatory Visit: Payer: Self-pay | Admitting: Cardiology

## 2021-10-26 ENCOUNTER — Ambulatory Visit: Payer: Medicare Other | Admitting: Cardiology

## 2021-10-26 ENCOUNTER — Encounter: Payer: Self-pay | Admitting: Cardiology

## 2021-10-26 VITALS — BP 112/70 | HR 62 | Ht 64.0 in | Wt 273.6 lb

## 2021-10-26 DIAGNOSIS — R55 Syncope and collapse: Secondary | ICD-10-CM | POA: Diagnosis not present

## 2021-10-26 DIAGNOSIS — I10 Essential (primary) hypertension: Secondary | ICD-10-CM | POA: Diagnosis not present

## 2021-10-26 DIAGNOSIS — E7800 Pure hypercholesterolemia, unspecified: Secondary | ICD-10-CM | POA: Diagnosis not present

## 2021-10-26 DIAGNOSIS — I6521 Occlusion and stenosis of right carotid artery: Secondary | ICD-10-CM

## 2021-10-26 DIAGNOSIS — G4733 Obstructive sleep apnea (adult) (pediatric): Secondary | ICD-10-CM

## 2021-10-26 MED ORDER — HYDROCHLOROTHIAZIDE 12.5 MG PO CAPS: ORAL_CAPSULE | ORAL | 3 refills | Status: DC

## 2021-10-26 NOTE — Progress Notes (Signed)
Cardiology  Note    Date:  10/26/2021   ID:  Ann Castillo, Ann Castillo 11-24-1960, MRN [id number]  PCP:  [place], Sarah T, MD  Cardiologist:  Armanda Magic, MD   Chief Complaint  Patient presents with   Sleep Apnea   Hypertension   Hyperlipidemia     History of Present Illness:   This is a 61yo female with a hx of GERD, Obesity, HLD, HTN who was initially referred for evaluation of HTN and syncope. 2D echo showed normal LVF with EF 55-60%, mild RVE with normal RVF, trivial MR and no AS.  Carotid dopplers showed 1-39% right carotid stenosis. Heart monitor showed no bradyarrhythmias or tachyarrhythmias.  Chest CTA with no PE. Sleep study showed mild OSA with severe nocturnal hypoxemia and CPAP titration was recommended and ultimately was titrated to CPAP 15 cm of H2O but has not started it because she had a lot of questions.  She ultimately was started on CPAP therapy and is now here for follow-up.  She is doing well with her CPAP device and thinks that she has gotten used to it.  She tolerates the full face mask except that she has to keep it tight to prevent leakage.  She feels the pressure is adequate.  She has no significant daytime sleepiness.  She occasionally has some mouth dryness.  She denies any significant nasal dryness or nasal congestion.  She does not think that he snores.     Past Medical History:  Diagnosis Date   Anemia, unspecified    Anxiety    Carotid stenosis, right    1-39% by doppler 10/2019   Cavus deformity of foot, acquired    Depression    Dysthymic disorder    Edema    bilateral   Esophageal reflux    Essential hypertension, benign    Hx pulmonary embolism 2006   was on estrogen    Insomnia, unspecified    Major depressive disorder, single episode, unspecified    Migraine, unspecified, without mention of intractable migraine without mention of status migrainosus    Myalgia and myositis, unspecified    Obesity, unspecified    Other and unspecified  hyperlipidemia    Polycystic ovaries    Unspecified gastritis and gastroduodenitis without mention of hemorrhage     Past Surgical History:  Procedure Laterality Date   no surgical history      Current Medications: Current Meds  Medication Sig   Barberry-Oreg Grape-Goldenseal (BERBERINE COMPLEX PO) Take 500 mg by mouth as directed.   Bempedoic Acid (NEXLETOL) 180 MG TABS Take 1 tablet by mouth daily.   Cholecalciferol (VITAMIN D3 PO) Take 5,000 Units by mouth 2 (two) times daily.   CINNAMON PO Take 250 mg by mouth daily.   co-enzyme Q-10 30 MG capsule Take 30 mg by mouth daily.   escitalopram (LEXAPRO) 10 MG tablet Take 1 tablet (10 mg total) by mouth daily.   escitalopram (LEXAPRO) 20 MG tablet TAKE 1 TABLET BY MOUTH DAILY IN  ADDITION TO 10 MG TO EQUAL 30 MG   Evolocumab (REPATHA SURECLICK) 140 MG/ML SOAJ INJECT 1 PEN INTO THE SKIN EVERY 14 DAYS   LECITHIN PO Take by mouth.   lisinopril-hydrochlorothiazide (ZESTORETIC) 10-12.5 MG tablet Take 1 tablet by mouth daily.   magnesium gluconate (MAGONATE) 500 MG tablet Take 500 mg by mouth daily.   MELATONIN PO Take by mouth.   meloxicam (MOBIC) 15 MG tablet Take 15 mg by mouth daily as needed for pain.  Milk Thistle 150 MG CAPS Take 150 mg by mouth 2 (two) times daily.   OVER THE COUNTER MEDICATION GENTLE MOVE STOOL SOFTENER BLEND   OVER THE COUNTER MEDICATION curcumin   UNABLE TO FIND 10 mg as needed. Med Name: CBD oil   vitamin B-12 (CYANOCOBALAMIN) 100 MCG tablet Take 100 mcg by mouth daily.    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  She has significant stress from daughter, who is mentally ill and lives at home (age 88).     Caffeine: very rarely    Social Determinants of Corporate investment banker Strain: Not on file  Food Insecurity: Not on file  Transportation Needs: Not on file  Physical Activity: Not on file  Stress: Not on file  Social Connections: Not on file     Family History:  The patient's family history includes ADD / ADHD in her daughter and daughter; Alzheimer's disease in her maternal uncle, mother, and paternal grandfather; Breast cancer in her maternal aunt and maternal grandmother; Diabetes in her father; Gallbladder disease in her father; Heart attack in her father; Heart disease in her father; Hypertension in her son; Learning disabilities in her daughter; Liver cancer in her father; Miscarriages / Stillbirths in her mother; Pancreatic cancer in her father; Personality disorder in her daughter; Tourette syndrome in her son.   ROS:   Please see the history of present illness.    ROS All other systems reviewed and are negative.      View : No data to display.           PHYSICAL EXAM:   VS:  BP 112/70   Pulse 62   Ht 5\' 4"  (1.626 m)   Wt 273 lb 9.6 oz (124.1 kg)   LMP 12/26/2012   SpO2 96%   BMI 46.96 kg/m     Orthostatic VS for the past 24 hrs (Last 3 readings):  BP- Lying Pulse- Lying BP- Sitting Pulse- Sitting BP- Standing at 0 minutes Pulse- Standing at 0 minutes  10/26/21 1143 102/64 57 102/65 57 103/63 57     GEN:  Well nourished, well developed in no acute distress HEENT: Normal NECK: No JVD; No carotid bruits LYMPHATICS: No lymphadenopathy CARDIAC:RRR, no murmurs, rubs, gallops RESPIRATORY:  Clear to auscultation without rales, wheezing or rhonchi  ABDOMEN: Soft, non-tender, non-distended MUSCULOSKELETAL:  No edema; No deformity  SKIN: Warm and dry NEUROLOGIC:  Alert and oriented x 3 PSYCHIATRIC:  Normal affect   Wt Readings from Last 3 Encounters:  10/26/21 273 lb 9.6 oz (124.1 kg)  08/04/21  278 lb 9.6 oz (126.4 kg)  05/23/21 261 lb (118.4 kg)      Studies/Labs Reviewed:   EKG:  EKG is not ordered today  Recent Labs: 02/24/2021: ALT 50    Lipid Panel    Component Value Date/Time   CHOL 129 02/24/2021 1039   TRIG 148 02/24/2021 1039   HDL 43 02/24/2021 1039   CHOLHDL 3.0 02/24/2021 1039   CHOLHDL 4.2 07/16/2011 1023   VLDL 28 07/16/2011 1023   LDLCALC 60 02/24/2021 1039   LDLDIRECT 181.4 02/14/2009 0000    Additional studies/ records that were reviewed today include:  OV notes ASSESSMENT:    1. Syncope and collapse   2. Essential hypertension, benign   3. Carotid stenosis, right   4. Pure hypercholesterolemia   5. OSA (obstructive sleep apnea)      PLAN:  In order of problems listed above:  1.  Syncope -Her symptoms were not clear cut and could not discern whether had  true syncope -some of her falls were related to tripping while other times it sounded like presyncope or syncope -orthostatic BPs were normal but soft so diuretic part of Lisinopril HCT was stopped but had recurrent LE edema and was started back on Lisinopril HCT -She denies any further episodes of syncope -now is having problems feeling dizzy after she gets up from laying down -orthostatic BPs are normal but resting BP is soft -stop Lisinopril HCT  -I recommend she just take the HCTZ 25 mg as needed for leg edema and not daily -I we will defer to her PCP whether they want to try a lower dose of just  lisinopril daily -2D echo with normal LVF and no valvular disease -carotid dopplers with 1-39% right carotid stenosis -no pauses or arrhythmias on event monitor -chest CTA with no PE -consider ILR if she has any further presyncope or syncope  2.  HTN -BP is adequately controlled on exam today -stop Lisinopril HCT due to soft BP and orthostatic dizziness -will sent a note to PCP regarding her soft BP and recommendations to stop ACE I>>she tells me her HbA1C is high so PCP may want her on low-dose ACE for renal protection but will defer to her PCP  3.  Carotid stenosis -dopplers showed 1-39% right -repeat dopplers 10/2021 -Continue Repatha and bempedoic acid as well as aspirin 81 mg daily with as needed refills  4.  HLD -LDL goal < 70 due to carotid stenosis -I have personally reviewed and interpreted outside labs performed by patient's PCP which showed LDL 60, HDL 43, total cholesterol 161129, triglycerides 148 and ALT 50 on 02/24/2021 -she is statin intolerant -Continue prescription drug management with epidemic acid 180 mg daily and Repatha with as needed refills  5.  RV dilatation/OSA/Nocturnal hypoxemia -RVE mild on echo 2021 -no PE on CTA 2021 -mild OSA on sleep study with AHI 8.6/hr and nocturnal hypoxemia on sleep study 12/2019 -She has stopped using her CPAP and was referred back for CPAP titration in December and was titrated to 15 cm H2O. -She was restarted on her CPAP at 15 cm H2O and is doing well. -The patient is tolerating PAP therapy well without any problems. The PAP download performed by his DME was personally reviewed and interpreted by me today and showed an AHI of 0.7 /hr on 15 cm H2O with 80% compliance in using more than 4 hours nightly.  The patient has been using and benefiting from PAP use and will continue to benefit from  therapy.  -encouraged her to adjust her humidity to help with mouth dryness  Follow-up with me in 1 year  Medication Adjustments/Labs and Tests  Ordered: Current medicines are reviewed at length with the patient today.  Concerns regarding medicines are outlined above.  Medication changes, Labs and Tests ordered today are listed in the Patient Instructions below.  There are no Patient Instructions on file for this visit.   Signed, Armanda Magic, MD  10/26/2021 11:47 AM    [facility] [address] [place], [place], [place]  [address] Phone: [phone]; Fax: [phone]

## 2021-10-26 NOTE — Addendum Note (Signed)
Addended by: Antonieta Iba on: 10/26/2021 12:03 PM   Modules accepted: Orders

## 2021-10-26 NOTE — Patient Instructions (Signed)
Medication Instructions:  Your physician has recommended you make the following change in your medication:  1) STOP taking lisinopril-HCTZ 2) START taking HCTZ 12.5 mg (1-2 tablets) daily as needed for swelling *If you need a refill on your cardiac medications before your next appointment, please call your pharmacy*  Follow-Up: At [facility], you and your health needs are our priority.  As part of our continuing mission to provide you with exceptional heart care, we have created designated Provider Care Teams.  These Care Teams include your primary Cardiologist (physician) and Advanced Practice Providers (APPs -  Physician Assistants and Nurse Practitioners) who all work together to provide you with the care you need, when you need it.  Your next appointment:   1 year(s)  The format for your next appointment:   In Person  Provider:   Armanda Magic, MD    Important Information About Sugar

## 2022-01-22 ENCOUNTER — Other Ambulatory Visit: Payer: Self-pay | Admitting: Cardiology

## 2022-04-04 ENCOUNTER — Ambulatory Visit: Payer: Medicare Other | Admitting: Physician Assistant

## 2022-05-03 ENCOUNTER — Ambulatory Visit: Payer: Medicare Other | Admitting: Physician Assistant

## 2022-06-06 ENCOUNTER — Other Ambulatory Visit (HOSPITAL_COMMUNITY): Payer: Self-pay

## 2022-06-14 ENCOUNTER — Encounter: Payer: Self-pay | Admitting: Physician Assistant

## 2022-06-14 ENCOUNTER — Ambulatory Visit (INDEPENDENT_AMBULATORY_CARE_PROVIDER_SITE_OTHER): Payer: Medicare Other | Admitting: Physician Assistant

## 2022-06-14 DIAGNOSIS — F411 Generalized anxiety disorder: Secondary | ICD-10-CM | POA: Diagnosis not present

## 2022-06-14 DIAGNOSIS — F3342 Major depressive disorder, recurrent, in full remission: Secondary | ICD-10-CM | POA: Diagnosis not present

## 2022-06-14 MED ORDER — ESCITALOPRAM OXALATE 10 MG PO TABS: 10.0000 mg | ORAL_TABLET | Freq: Every day | ORAL | 3 refills | Status: DC

## 2022-06-14 MED ORDER — ESCITALOPRAM OXALATE 20 MG PO TABS
ORAL_TABLET | ORAL | 3 refills | Status: DC
Start: 2022-06-14 — End: 2022-09-16

## 2022-06-14 NOTE — Progress Notes (Signed)
[facility] Med [facility]  Patient ID: Ann Castillo,  MRN: [id number]  PCP: [place], Sarah T, MD  Date of Evaluation: 06/14/2022 Time spent:20 minutes  Chief Complaint:  Chief Complaint   Anxiety; Depression; Follow-up    HISTORY/CURRENT STATUS: HPI Routine Med check  Doing really well. 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 She still has stress with her dtr but she's learned to let go.   Patient is able to enjoy things.  Energy and motivation are good.   No extreme sadness, tearfulness, or feelings of hopelessness.  Sleeps well most of the time. ADLs and personal hygiene are normal.   Denies any changes in concentration, making decisions, or remembering things.  Appetite has not changed.  Weight is stable. Not having a lot  of anxietyDenies suicidal or homicidal thoughts.  Patient denies increased energy with decreased need for sleep, increased talkativeness, racing thoughts, impulsivity or risky behaviors, increased spending, increased libido, grandiosity, increased irritability or anger, paranoia, or hallucinations.  Denies dizziness, syncope, seizures, numbness, tingling, tremor, tics, unsteady gait, slurred speech, confusion. Denies muscle or joint pain, stiffness, or dystonia.  Individual Medical History/ Review of Systems: Changes? :No      Past medications for mental health diagnoses include: Lexapro, trazodone, loxapine, Ambien, Klonopin, melatonin   Allergies: Celecoxib, Codeine, Estrogenic substance, Hydromorphone hcl, Latex, Pravastatin sodium, Rosuvastatin, Codeine phosphate, Dilaudid [hydromorphone hcl], Estrogen, Pravastatin sodium, and Zetia [ezetimibe]  Current Medications:  Current Outpatient Medications:    Barberry-Oreg Grape-Goldenseal (BERBERINE COMPLEX PO), Take 500 mg by mouth as directed., Disp: , Rfl:    Bempedoic Acid (NEXLETOL) 180 MG TABS, Take 1 tablet by mouth daily., Disp: 30 tablet, Rfl: 8    Cholecalciferol (VITAMIN D3 PO), Take 5,000 Units by mouth 2 (two) times daily., Disp: , Rfl:    CINNAMON PO, Take 250 mg by mouth daily., Disp: , Rfl:    co-enzyme Q-10 30 MG capsule, Take 30 mg by mouth daily., Disp: , Rfl:    Evolocumab (REPATHA SURECLICK) 836 MG/ML SOAJ, INJECT 1 PEN INTO THE SKIN EVERY 14 DAYS, Disp: 6 mL, Rfl: 3   hydrochlorothiazide (MICROZIDE) 12.5 MG capsule, TAKE 1-2 TABLETS DAILY AS NEEDED FOR SWELLING, Disp: 180 capsule, Rfl: 1   LECITHIN PO, Take by mouth., Disp: , Rfl:    magnesium gluconate (MAGONATE) 500 MG tablet, Take 500 mg by mouth daily., Disp: , Rfl:    MELATONIN PO, Take by mouth., Disp: , Rfl:    meloxicam (MOBIC) 15 MG tablet, Take 15 mg by mouth daily as needed for pain. , Disp: , Rfl:    Milk Thistle 150 MG CAPS, Take 150 mg by mouth 2 (two) times daily., Disp: , Rfl:    OVER THE COUNTER MEDICATION, curcumin, Disp: , Rfl:    UNABLE TO FIND, 10 mg as needed. Med Name: CBD oil, Disp: , Rfl:    vitamin B-12 (CYANOCOBALAMIN) 100 MCG tablet, Take 100 mcg by mouth daily., Disp: , Rfl:    aspirin EC 81 MG tablet, Take 1 tablet (81 mg total) by mouth daily. Swallow whole. (Patient not taking: Reported on 08/04/2021), Disp: 90 tablet, Rfl: 3   Chromium Picolinate 500 MCG CAPS, Take 500 mcg by mouth as directed. (Patient not taking: Reported on 10/26/2021), Disp: , Rfl:    escitalopram (LEXAPRO) 10 MG tablet, Take 1 tablet (10 mg total) by mouth daily., Disp: 90 tablet, Rfl: 3   escitalopram (LEXAPRO) 20 MG tablet, TAKE 1  TABLET BY MOUTH DAILY IN  ADDITION TO 10 MG TO EQUAL 30 MG, Disp: 90 tablet, Rfl: 3   OVER THE COUNTER MEDICATION, Source of life gold tablets (Patient not taking: Reported on 09/27/2021), Disp: , Rfl:    OVER THE COUNTER MEDICATION, adaptocrine (Patient not taking: Reported on 09/27/2021), Disp: , Rfl:    OVER THE COUNTER MEDICATION, GENTLE MOVE STOOL SOFTENER BLEND (Patient not taking: Reported on 06/14/2022), Disp: , Rfl:    OVER THE COUNTER  MEDICATION, L Glutamine 750 mg (Patient not taking: Reported on 09/27/2021), Disp: , Rfl:    OVER THE COUNTER MEDICATION, Digestive enzyme (Patient not taking: Reported on 09/27/2021), Disp: , Rfl:    OVER THE COUNTER MEDICATION, MONOLAURIN 500 MG (Patient not taking: Reported on 09/27/2021), Disp: , Rfl:    OVER THE COUNTER MEDICATION, Joint binder (Patient not taking: Reported on 09/27/2021), Disp: , Rfl:  Medication Side Effects: none  Family Medical/ Social History: Changes?  No  MENTAL HEALTH EXAM:  Last menstrual period 12/26/2012.There is no height or weight on file to calculate BMI.  General Appearance: Casual, Neat, Well Groomed and Obese  Eye Contact:  Good  Speech:  Clear and Coherent and Normal Rate  Volume:  Normal  Mood:  Euthymic  Affect:  Appropriate  Thought Process:  Goal Directed and Descriptions of Associations: Circumstantial  Orientation:  Full (Time, Place, and Person)  Thought Content: Logical   Suicidal Thoughts:  No  Homicidal Thoughts:  No  Memory:  WNL  Judgement:  Good  Insight:  Good  Psychomotor Activity:  Normal  Concentration:  Concentration: Good and Attention Span: Good  Recall:  Good  Fund of Knowledge: Good  Language: Good  Assets:  Desire for Improvement Financial Resources/Insurance Housing Transportation Vocational/Educational  ADL's:  Intact  Cognition: WNL  Prognosis:  Good   DIAGNOSES:    ICD-10-CM   1. Major depression, recurrent, full remission ([place])  F33.42     2. Generalized anxiety disorder  F41.1      Receiving Psychotherapy: No    RECOMMENDATIONS:  PDMP reviewed.  No controlled substances. I provided 20 minutes of face to face time during this encounter, including time spent before and after the visit in records review, medical decision making, counseling pertinent to today's visit, and charting.   Doing well so no changes.  . Continue Lexapro 20 mg +10 mg daily. Continue vitamins and supplements from her integrative  doctor. Return in 6 months.  Donnal Moat, PA-C

## 2022-06-18 ENCOUNTER — Other Ambulatory Visit: Payer: Self-pay | Admitting: Orthopaedic Surgery

## 2022-06-18 DIAGNOSIS — M94261 Chondromalacia, right knee: Secondary | ICD-10-CM

## 2022-07-01 ENCOUNTER — Other Ambulatory Visit: Payer: Self-pay | Admitting: Cardiology

## 2022-07-02 ENCOUNTER — Other Ambulatory Visit: Payer: Self-pay

## 2022-07-02 MED ORDER — REPATHA SURECLICK 140 MG/ML ~~LOC~~ SOAJ: SUBCUTANEOUS | 3 refills | Status: DC

## 2022-07-02 MED ORDER — NEXLETOL 180 MG PO TABS: 1.0000 | ORAL_TABLET | Freq: Every day | ORAL | 4 refills | Status: DC

## 2022-07-02 NOTE — Telephone Encounter (Signed)
Pt is wanting a call back concerning this refill. Pt states that she is been on a grant, to get her medications free. Please address

## 2022-07-11 ENCOUNTER — Telehealth: Payer: Self-pay | Admitting: Pharmacist

## 2022-07-11 NOTE — Telephone Encounter (Signed)
Pt re-enrolled in [place] for her Repatha and Nexletol:  CARD NO. [id number]   BIN [id number]   PCN [id number]   GROUP [id number]  She was provided with above info to bring to her pharmacy.

## 2022-07-14 ENCOUNTER — Ambulatory Visit
Admission: RE | Admit: 2022-07-14 | Discharge: 2022-07-14 | Disposition: A | Discharge status: Home / Self Care | Payer: Medicare Other | Source: Ambulatory Visit | Attending: Orthopaedic Surgery | Admitting: Orthopaedic Surgery

## 2022-07-14 DIAGNOSIS — M94261 Chondromalacia, right knee: Secondary | ICD-10-CM

## 2022-09-15 ENCOUNTER — Other Ambulatory Visit: Payer: Self-pay | Admitting: Physician Assistant

## 2022-10-03 ENCOUNTER — Other Ambulatory Visit: Payer: Self-pay

## 2022-10-03 MED ORDER — ESCITALOPRAM OXALATE 10 MG PO TABS: 10.0000 mg | ORAL_TABLET | Freq: Every day | ORAL | 3 refills | Status: DC

## 2022-10-08 LAB — LAB REPORT - SCANNED
A1c: 6.2
EGFR: 90

## 2022-12-13 ENCOUNTER — Encounter: Payer: Self-pay | Admitting: Physician Assistant

## 2022-12-13 ENCOUNTER — Ambulatory Visit: Payer: Medicare Other | Admitting: Physician Assistant

## 2022-12-13 DIAGNOSIS — F4323 Adjustment disorder with mixed anxiety and depressed mood: Secondary | ICD-10-CM | POA: Diagnosis not present

## 2022-12-13 DIAGNOSIS — E6609 Other obesity due to excess calories: Secondary | ICD-10-CM

## 2022-12-13 MED ORDER — ESCITALOPRAM OXALATE 20 MG PO TABS: 40.0000 mg | ORAL_TABLET | Freq: Every day | ORAL | 1 refills | Status: DC

## 2022-12-13 NOTE — Progress Notes (Signed)
[facility]  Patient ID: Ann Castillo,  MRN: [id number]  PCP: [place], Sarah T, MD  Date of Evaluation: 12/13/2022 Time spent: 22 minutes  Chief Complaint:  Chief Complaint   Anxiety; Depression; Follow-up    HISTORY/CURRENT STATUS: HPI Routine Med check  Very anxious, political issues bother her and she's quit watching the news. Very worried about [employer]. "It's all evil." Dtr has moved, she hasn't talked to her in 18 months. "I can't find a quiet place in my head. 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  She is not able to let it go then.  She does not want to take any medicine that would control her (benzodiazepines.)  Patient is able to enjoy some things.  Energy and motivation are good.  No feelings of hopelessness, but feels sad.  Sleeps well most of the time.  ADLs and personal hygiene are normal.   She has a hard time focusing.  Tried meditating but she couldn't.  Appetite has not changed.  Weight is stable.  Denies suicidal or homicidal thoughts.  Patient denies increased energy with decreased need for sleep, increased talkativeness, racing thoughts, impulsivity or risky behaviors, increased spending, increased libido, grandiosity, increased irritability or anger, paranoia, or hallucinations.  Denies dizziness, syncope, seizures, numbness, tingling, tremor, tics, unsteady gait, slurred speech, confusion. Denies muscle or joint pain, stiffness, or dystonia. Denies unexplained weight loss, frequent infections, or sores that heal slowly.  No polyphagia, polydipsia, or polyuria. Denies visual changes or paresthesias.   Individual Medical History/ Review of Systems: Changes? :No      Past medications for mental health diagnoses include: Lexapro, trazodone, loxapine, Ambien, Klonopin, melatonin   Allergies: Celecoxib, Codeine, Estrogenic substance, Hydromorphone hcl, Latex, Pravastatin sodium,  Rosuvastatin, Codeine phosphate, Dilaudid [hydromorphone hcl], Estrogen, Pravastatin sodium, and Zetia [ezetimibe]  Current Medications:  Current Outpatient Medications:    Barberry-Oreg Grape-Goldenseal (BERBERINE COMPLEX PO), Take 500 mg by mouth as directed., Disp: , Rfl:    Bempedoic Acid (NEXLETOL) 180 MG TABS, Take 1 tablet (180 mg total) by mouth daily., Disp: 30 tablet, Rfl: 4   Cholecalciferol (VITAMIN D3 PO), Take 5,000 Units by mouth 2 (two) times daily., Disp: , Rfl:    co-enzyme Q-10 30 MG capsule, Take 30 mg by mouth daily., Disp: , Rfl:    Evolocumab (REPATHA SURECLICK) 140 MG/ML SOAJ, INJECT 1 PEN INTO THE SKIN EVERY 14 DAYS, Disp: 6 mL, Rfl: 3   hydrochlorothiazide (MICROZIDE) 12.5 MG capsule, TAKE 1-2 TABLETS DAILY AS NEEDED FOR SWELLING, Disp: 180 capsule, Rfl: 1   LECITHIN PO, Take by mouth., Disp: , Rfl:    magnesium gluconate (MAGONATE) 500 MG tablet, Take 500 mg by mouth daily., Disp: , Rfl:    meloxicam (MOBIC) 15 MG tablet, Take 15 mg by mouth daily as needed for pain. , Disp: , Rfl:    Milk Thistle 150 MG CAPS, Take 150 mg by mouth 2 (two) times daily., Disp: , Rfl:    OVER THE COUNTER MEDICATION, curcumin, Disp: , Rfl:    UNABLE TO FIND, 10 mg as needed. Med Name: CBD oil, Disp: , Rfl:    vitamin B-12 (CYANOCOBALAMIN) 100 MCG tablet, Take 100 mcg by mouth daily., Disp: , Rfl:    aspirin EC 81 MG tablet, Take 1 tablet (81 mg total) by mouth daily. Swallow whole. (Patient not taking: Reported on 08/04/2021), Disp: 90 tablet, Rfl: 3   CINNAMON  PO, Take 250 mg by mouth daily. (Patient not taking: Reported on 12/13/2022), Disp: , Rfl:    escitalopram (LEXAPRO) 20 MG tablet, Take 2 tablets (40 mg total) by mouth daily., Disp: 180 tablet, Rfl: 1   MELATONIN PO, Take by mouth. (Patient not taking: Reported on 12/13/2022), Disp: , Rfl:  Medication Side Effects: none  Family Medical/ Social History: Changes?  No  MENTAL HEALTH EXAM:  Last menstrual period 12/26/2012.There is  no height or weight on file to calculate BMI.  General Appearance: Casual, Well Groomed, and morbidly obese  Eye Contact:  Good  Speech:  Clear and Coherent and Normal Rate  Volume:  Normal  Mood:  Depressed  Affect:  Depressed  Thought Process:  Goal Directed and Descriptions of Associations: Circumstantial  Orientation:  Full (Time, Place, and Person)  Thought Content: Logical   Suicidal Thoughts:  No  Homicidal Thoughts:  No  Memory:  WNL  Judgement:  Good  Insight:  Good  Psychomotor Activity:  Normal  Concentration:  Concentration: Good and Attention Span: Good  Recall:  Good  Fund of Knowledge: Good  Language: Good  Assets:  Desire for Improvement Financial Resources/Insurance Housing Transportation Vocational/Educational  ADL's:  Intact  Cognition: WNL  Prognosis:  Good   DIAGNOSES:    ICD-10-CM   1. Situational mixed anxiety and depressive disorder  F43.23     2. Obesity due to excess calories, unspecified classification, unspecified whether serious comorbidity present  E66.09       Receiving Psychotherapy: No    RECOMMENDATIONS:  PDMP reviewed.  No controlled substances. I provided 22 minutes of face to face time during this encounter, including time spent before and after the visit in records review, medical decision making, counseling pertinent to today's visit, and charting.   We discussed her symptoms.  I recommend increasing the Lexapro.  She has responded well to this for several years but with the circumstances and the fact that she has been on it for a while, it may have just pooped out at this dose.  Up to 40 mg is considered within normal range so we will increase to that.  She agrees with this plan. We discussed the anxiety.  I offered small amount of a benzodiazepine for her to take in emergencies, particularly before the increase of Lexapro helps with the anxiety.  She again told me she did not want to take anything that would control her.  She will  try to deal with the anxiety and other ways that she has learned in therapy over the years.  She is interested in therapy again though.  Increase Lexapro 20 mg to 2 po daily. Continue vitamins and supplements from her integrative doctor. Referred to Adalberto Ill, [facility]. Return in 6 weeks.   Melony Overly, PA-C

## 2022-12-18 ENCOUNTER — Other Ambulatory Visit: Payer: Self-pay | Admitting: Cardiology

## 2022-12-21 NOTE — Progress Notes (Signed)
Cardiology Office Note:  .   Date:  12/31/2022  ID:  Ann Castillo, DOB 08-20-1960, MRN [id number] PCP: [place], Sarah T, MD  [facility] Providers Cardiologist:  Armanda Magic, MD    Patient Profile: .      PMH: Sleep apnea Hypertension Hyperlipidemia Obesity GERD Syncope  She was initially seen by Dr. Shirlee Latch for chest pain.  She had a negative Myoview in March 2011.  Repeat ETT was normal 11/2011.   She returned for evaluation of hypertension and syncope 10/20/2019 and was seen by Dr. Mayford Knife.  She reported recent frequent falls and feeling lightheaded before she falls.  Usually occurring when she is walking and will fall flat on her face.  Occurred 4 times in the previous 2 years.  She reported on one occasion in 2019 while in the gym she suddenly passed out and woke up in a chair.  Carotid ultrasound done in 2019 revealed 1 to 39% carotid stenosis.  Orthostatic vital signs were normal but SBP on low side at 106 mmHg.  She was advised to discontinue lisinopril HCT and start lisinopril 10 mg daily.  TTE 11/11/2019 showed normal heart function with mildly enlarged RV and mildly thickened AV leaflets.  Carotid duplex 11/11/2019 revealed 1 to 39% stenosis RICA.  Cardiac monitor completed 12/02/2019 revealed sinus bradycardia during night hours, no pauses or arrhythmias, no atrial fibrillation.  Chest CTA normal.  Sleep study showed mild OSA with severe nocturnal hypoxemia and CPAP titration was recommended.  Last cardiology clinic visit was 10/26/2021 with Dr. Mayford Knife at which time she reported doing well with CPAP device.  She continued to have soft BP and some orthostasis, no syncope.  She was advised to stop lisinopril-HCT and take hydrochlorothiazide 25 mg daily as needed for leg edema. Was referred to PCP for further medication titration. Hyperlipidemia well-controlled on bempedoic acid and Repatha.  She was advised to return for follow-up in 1 year.       History of Present Illness: .    Ann Castillo is a very pleasant 62 y.o. female who is here today for one year follow-up.  Reports she is feeling well.  Has started compounded semaglutide for weight loss. Has decreased appetite but tries to make herself eat every few hours. Had Lp(a) and A1C checked by the provider that is prescribing semaglutide, was told Lp(a) was normal and A1C is 6.2. Is also walking approximately 3 miles daily and  tries to get majority of calories from high protein, low carb foods. 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 No presyncope or syncope over the past year. She denies chest pain, shortness of breath, lower extremity edema, fatigue, palpitations, orthopnea, and PND. HR gets up to high 130s when walking up inclines but returns to normal quickly with rest. She has not had any lightheadedness with exercise.   ROS: See HPI       Studies Reviewed: Marland Kitchen   EKG Interpretation Date/Time:  Monday December 31 2022 11:10:00 EDT Ventricular Rate:  70 PR Interval:  166 QRS Duration:  78 QT Interval:  398 QTC Calculation: 429 R Axis:   10  Text Interpretation: Normal sinus rhythm Normal ECG When compared with ECG of 18-Jan-2018 11:45, PREVIOUS ECG IS PRESENT Confirmed by Eligha Bridegroom [phone]) on 12/31/2022 11:14:31 AM     Risk Assessment/Calculations:  Physical Exam:   VS:  BP 126/74   Pulse 68   Ht 5\' 4"  (1.626 m)   Wt 279 lb 12.8 oz (126.9 kg)   LMP 12/26/2012   SpO2 96%   BMI 48.03 kg/m    Wt Readings from Last 3 Encounters:  12/31/22 279 lb 12.8 oz (126.9 kg)  10/26/21 273 lb 9.6 oz (124.1 kg)  08/04/21 278 lb 9.6 oz (126.4 kg)    GEN: Obese, well developed in no acute distress NECK: No JVD; No carotid bruits CARDIAC: RRR, no murmurs, rubs, gallops RESPIRATORY:  Clear to auscultation without rales, wheezing or rhonchi  ABDOMEN: Soft, non-tender, non-distended EXTREMITIES:  No edema; No  deformity     ASSESSMENT AND PLAN: .    Syncope: No presyncope or syncope recently.  She is able to exert herself with exercise with no symptoms of lightheadedness. We will continue current antihypertensive therapy.  History of hypertension/Orthostatic hypotension: BP is well-controlled.  She is taking hydrochlorothiazide 12.5 mg daily.  She is working on weight loss, is exercising daily and eating a heart healthy diet. Continue these efforts. Had recent lab tests including renal function. Will drop off copies for [place].  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 She feels that weight loss is benefiting her sleep apnea. She denies daytime somnolence. I reviewed previous finding of mildly enlarged RV on echo 10/2019 and effects that sleep apnea can have on heart with her and she verbalized understanding. She defers further treatment at this time. No indication to repeat echo at this time.   Hyperlipidemia: Reports normal Lp(a) recently tested by integrative medicine.  No recent lipid panel.  She will return tomorrow for fasting lab work.  Continue healthy diet, exercise, Repatha and Nexletol.   Carotid artery disease: Carotid duplex 10/2019 with 1-39% stenosis RICA. She is asymptomatic. She would like to defer further testing at this time which I think is reasonable given good cholesterol control and no symptoms of TIA/stroke/presyncope/syncope.         Dispo: 1 year with Dr. Mayford Knife  Signed, Eligha Bridegroom, NP-C

## 2022-12-26 ENCOUNTER — Ambulatory Visit: Payer: Medicare Other | Admitting: Psychiatry

## 2022-12-26 DIAGNOSIS — F4323 Adjustment disorder with mixed anxiety and depressed mood: Secondary | ICD-10-CM | POA: Diagnosis not present
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
## 2022-12-31 ENCOUNTER — Encounter: Payer: Self-pay | Admitting: Nurse Practitioner

## 2022-12-31 ENCOUNTER — Ambulatory Visit: Payer: Medicare Other | Attending: Nurse Practitioner | Admitting: Nurse Practitioner

## 2022-12-31 ENCOUNTER — Other Ambulatory Visit (HOSPITAL_COMMUNITY): Payer: Self-pay

## 2022-12-31 VITALS — BP 126/74 | HR 68 | Ht 64.0 in | Wt 279.8 lb

## 2022-12-31 DIAGNOSIS — I951 Orthostatic hypotension: Secondary | ICD-10-CM

## 2022-12-31 DIAGNOSIS — E7800 Pure hypercholesterolemia, unspecified: Secondary | ICD-10-CM | POA: Diagnosis not present

## 2022-12-31 DIAGNOSIS — I6521 Occlusion and stenosis of right carotid artery: Secondary | ICD-10-CM | POA: Diagnosis not present

## 2022-12-31 DIAGNOSIS — G4733 Obstructive sleep apnea (adult) (pediatric): Secondary | ICD-10-CM | POA: Diagnosis not present

## 2022-12-31 DIAGNOSIS — I10 Essential (primary) hypertension: Secondary | ICD-10-CM | POA: Diagnosis not present

## 2022-12-31 DIAGNOSIS — R55 Syncope and collapse: Secondary | ICD-10-CM

## 2022-12-31 NOTE — Patient Instructions (Signed)
Medication Instructions:   Your physician recommends that you continue on your current medications as directed. Please refer to the Current Medication list given to you today.  *If you need a refill on your cardiac medications before your next appointment, please call your pharmacy*   Lab Work:  Your physician recommends that you return for a FASTING lipid profile on Tuesday, August 6. You can come in on the day of your appointment anytime between 7:30-4:30 fasting from midnight the night before. Please drop off all other labs.   If you have labs (blood work) drawn today and your tests are completely normal, you will receive your results only by: MyChart Message (if you have MyChart) OR A paper copy in the mail If you have any lab test that is abnormal or we need to change your treatment, we will call you to review the results.   Testing/Procedures:  None ordered.   Follow-Up: At [facility], you and your health needs are our priority.  As part of our continuing mission to provide you with exceptional heart care, we have created designated Provider Care Teams.  These Care Teams include your primary Cardiologist (physician) and Advanced Practice Providers (APPs -  Physician Assistants and Nurse Practitioners) who all work together to provide you with the care you need, when you need it.  We recommend signing up for the patient portal called "MyChart".  Sign up information is provided on this After Visit Summary.  MyChart is used to connect with patients for Virtual Visits (Telemedicine).  Patients are able to view lab/test results, encounter notes, upcoming appointments, etc.  Non-urgent messages can be sent to your provider as well.   To learn more about what you can do with MyChart, go to [email or web address].    Your next appointment:   1 year(s)  Provider:   Armanda Magic, MD     Other Instructions  Your physician wants you to follow-up in: 1 year.  You will receive  a reminder letter in the mail two months in advance. If you don't receive a letter, please call our office to schedule the follow-up appointment.

## 2023-01-01 ENCOUNTER — Ambulatory Visit: Payer: Medicare Other | Attending: Cardiology

## 2023-01-01 DIAGNOSIS — I10 Essential (primary) hypertension: Secondary | ICD-10-CM

## 2023-01-01 DIAGNOSIS — I6521 Occlusion and stenosis of right carotid artery: Secondary | ICD-10-CM | POA: Diagnosis not present

## 2023-01-01 LAB — LIPID PANEL
Chol/HDL Ratio: 4.6 ratio — ABNORMAL HIGH (ref 0.0–4.4)
Cholesterol, Total: 193 mg/dL (ref 100–199)
HDL: 42 mg/dL (ref >39)
LDL Chol Calc (NIH): 122 mg/dL — ABNORMAL HIGH (ref 0–99)
Triglycerides: 163 mg/dL — ABNORMAL HIGH (ref 0–149)
VLDL Cholesterol Cal: 29 mg/dL (ref 5–40)

## 2023-01-08 DIAGNOSIS — Z1231 Encounter for screening mammogram for malignant neoplasm of breast: Secondary | ICD-10-CM | POA: Diagnosis not present

## 2023-01-14 ENCOUNTER — Ambulatory Visit: Payer: Medicare Other | Admitting: Psychiatry

## 2023-01-14 DIAGNOSIS — F4323 Adjustment disorder with mixed anxiety and depressed mood: Secondary | ICD-10-CM

## 2023-01-14 NOTE — Progress Notes (Signed)
[facility] Counselor/Therapist Progress Note  Patient ID: Ann Castillo, MRN: [id number],    Date: 01/14/2023  Time Spent: 55 minutes  Treatment Type: Individual Therapy  Reported Symptoms:  anxiety, confusion, anger, situation depression/anxiety, sad, irritable, trying to be optimistic   Mental Status Exam:  Appearance:   Casual and Neat     Behavior:  Appropriate, Sharing, and Motivated  Motor:  Normal  Speech/Language:   Clear and Coherent  Affect:  Depressed and anxious  Mood:  anxious and depressed  Thought process:  goal directed  Thought content:    WNL  Sensory/Perceptual disturbances:    WNL  Orientation:  oriented to person, place, time/date, situation, day of week, month of year, year, and stated date of Aug. 19, 2024  Attention:  Fair  Concentration:  Fair  Memory:  WNL Later states some short term memory issues and Dr is aware  Fund of knowledge:   Good  Insight:    Good  Judgment:   Good  Impulse Control:  Good   Risk Assessment: Danger to Self:  No Self-injurious Behavior: No Danger to Others: No Duty to Warn:no Physical Aggression / Violence:No  Access to Firearms a concern: No  Gang Involvement:No   Subjective:  Patient in today reporting anxiety, confusion, anger, sad, situational anxiety, sad, and some irritability. Hasn't had contact with 70 yr old daughter very briefly in April 2024 to let patient know she and female partner had moved and patient suspects they are probably in state. Daughter, previously diagnosed Borderline Personality Disorder. Not working) refuses to meet with patient and is very Holiday representative. Patient has "put boundaries on contact with daughter but not turn her back" and leaving "door open". "Hurt by the way patient wil not allow contact". Worked today on her sadness, anxiety, anger, hurt. 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 "Daydreams of her returning  to [place], but trying not to assume anything."  Really struggling with a lot of her mixed thoughts and feelings, and trying to make some meaningful contact with daughter. Will pick up on this next visit.  Did good work today but has difficulty in being more hopeful and trying to move forward, but clearly states that she wants to do so.  Needed session today to do a lot more venting and sharing of history with her and her adult daughter over the years, as well as focus on where we can go from here.  Feels like daughter has all the control.  Interventions: Cognitive Behavioral Therapy and Ego-Supportive  1.Reduce overall level, frequency, and intensity of her anxiety so that her motivation and follow through on goals is not  impaired. 2.Enhance her ability to handle effectively the full variety of life's anxieties. 3.Identify the major conflicts and present that form the basis for present anxiety.   Diagnosis:   ICD-10-CM   1. Situational mixed anxiety and depressive disorder  F43.23      Plan: Patient in session today and again talking very openly and sharing a lot more information including history not previously shared and  some details since her last appointment.  (Not all details included in this note due to patient privacy needs.).  Patient does remain motivated although weary at times.  Working to try and better manage her overall anxiety and doubts for change, and discover more motivation within as she does want change for the future.  Is working well with goal-directed behaviors and needs to continue this in order to eventually experience progress.  Goal review and progress/challenges  noted with patient.  Next appointment within 3 weeks.   Mathis Fare, LCSW

## 2023-01-16 ENCOUNTER — Telehealth: Payer: Self-pay | Admitting: *Deleted

## 2023-01-16 DIAGNOSIS — E7800 Pure hypercholesterolemia, unspecified: Secondary | ICD-10-CM

## 2023-01-16 NOTE — Telephone Encounter (Signed)
Pt has been made aware of her lab results.

## 2023-01-23 ENCOUNTER — Ambulatory Visit: Payer: Medicare Other | Admitting: Psychiatry

## 2023-01-24 ENCOUNTER — Ambulatory Visit: Payer: Medicare Other | Admitting: Physician Assistant

## 2023-01-24 ENCOUNTER — Other Ambulatory Visit: Payer: Self-pay | Admitting: Cardiology

## 2023-01-24 MED ORDER — NEXLETOL 180 MG PO TABS: 1.0000 | ORAL_TABLET | Freq: Every day | ORAL | 3 refills | Status: DC

## 2023-01-29 ENCOUNTER — Ambulatory Visit: Payer: Medicare Other | Admitting: Psychiatry

## 2023-01-29 DIAGNOSIS — F4323 Adjustment disorder with mixed anxiety and depressed mood: Secondary | ICD-10-CM | POA: Diagnosis not present
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
## 2023-02-04 NOTE — Progress Notes (Unsigned)
HPI: [name] is a 62 y.o. female, who is here today to establish care.  Former PCP: *** Last preventive routine visit: ***  Chronic medical problems: ***  Concerns today: ***  Review of Systems See other pertinent positives and negatives in HPI.  Current Outpatient Medications on File Prior to Visit  Medication Sig Dispense Refill   Bempedoic Acid (NEXLETOL) 180 MG TABS Take 1 tablet (180 mg total) by mouth daily. 90 tablet 3   Cholecalciferol (VITAMIN D3 PO) Take 5,000 Units by mouth 2 (two) times daily.     CINNAMON PO Take 250 mg by mouth daily.     co-enzyme Q-10 30 MG capsule Take 30 mg by mouth daily.     escitalopram (LEXAPRO) 20 MG tablet Take 2 tablets (40 mg total) by mouth daily. 180 tablet 1   Evolocumab (REPATHA SURECLICK) 140 MG/ML SOAJ INJECT 1 PEN INTO THE SKIN EVERY 14 DAYS 6 mL 3   hydrochlorothiazide (MICROZIDE) 12.5 MG capsule TAKE 1-2 TABLETS DAILY AS NEEDED FOR SWELLING 180 capsule 1   LECITHIN PO Take by mouth.     magnesium gluconate (MAGONATE) 500 MG tablet Take 500 mg by mouth daily.     meloxicam (MOBIC) 15 MG tablet Take 15 mg by mouth daily as needed for pain.      Milk Thistle 150 MG CAPS Take 150 mg by mouth 2 (two) times daily.     OVER THE COUNTER MEDICATION curcumin     UNABLE TO FIND 10 mg as needed. Med Name: CBD oil     vitamin B-12 (CYANOCOBALAMIN) 100 MCG tablet Take 100 mcg by mouth daily.     No current facility-administered medications on file prior to visit.    Past Medical History:  Diagnosis Date   Anemia, unspecified    Anxiety    Carotid stenosis, right    1-39% by doppler 10/2019   Cavus deformity of foot, acquired    Depression    Dysthymic disorder    Edema    bilateral   Esophageal reflux    Essential hypertension, benign    Hx pulmonary embolism 2006   was on estrogen    Insomnia, unspecified    Major depressive disorder, single episode, unspecified    Migraine, unspecified, without mention of  intractable migraine without mention of status migrainosus    Myalgia and myositis, unspecified    Obesity, unspecified    Other and unspecified hyperlipidemia    Polycystic ovaries    Unspecified gastritis and gastroduodenitis without mention of hemorrhage    Allergies  Allergen Reactions   Celecoxib Anaphylaxis    REACTION: ? anaphylactic reaction 10/11 - throat closing, hand itching 1 1/2 hrs after dose but had taken this previously without difficulty. REACTION: ? anaphylactic reaction 10/11 - throat closing, hand itching 1 1/2 hrs after dose but had taken this previously without difficulty.   Codeine Other (See Comments)    REACTION: questionable: mental status changes   Estrogenic Substance Other (See Comments)    Blood clots   Hydromorphone Hcl Other (See Comments)    Pain  Increases.  blindness   Latex Rash   Pravastatin Sodium Other (See Comments)    REACTION: upper extremity muscle aches - ? anaphylactic reaction 3 days later 03/07/08   Rosuvastatin Other (See Comments)    REACTION: myalgia REACTION: myalgia   Codeine Phosphate     REACTION: questionable: mental status changes   Dilaudid [Hydromorphone Hcl]     Pain  Increases.  blindness   Estrogen    Pravastatin Sodium     REACTION: upper extremity muscle aches - ? anaphylactic reaction 3 days later 03/07/08   Zetia [Ezetimibe]     Stomach cramping, diarrhea    Family History  Problem Relation Age of Onset   Diabetes Father    Heart disease Father    Liver cancer Father    Pancreatic cancer Father    Gallbladder disease Father    Heart attack Father        x5, first @ 68   Miscarriages / [place] Mother    Alzheimer's disease Mother    Learning disabilities Daughter        59 y.o mental capacity   ADD / ADHD Daughter    Breast cancer Maternal Grandmother    Alzheimer's disease Paternal Grandfather    Alzheimer's disease Maternal Uncle    Hypertension Son    Tourette syndrome Son    Personality  disorder Daughter    ADD / ADHD Daughter    Breast cancer Maternal Aunt    Colon cancer Neg Hx     Social History   Socioeconomic History   Marital status: Married    Spouse name: Not on file   Number of children: 3   Years of education: College   Highest education level: Not on file  Occupational History   Occupation: [employer]    Employer: [employer]  Tobacco Use   Smoking status: Never   Smokeless tobacco: Never  Vaping Use   Vaping status: Never Used  Substance and Sexual Activity   Alcohol use: Yes    Alcohol/week: 0.0 - 1.0 standard drinks of alcohol   Drug use: No   Sexual activity: Not Currently    Comment: 1st intercourse- 20, partners- 1, married- 38 yr s  Other Topics Concern   Not on file  Social History Narrative   Married, works as a IT consultant.  She has significant stress from daughter, who is mentally ill and lives at home (age 61).     Caffeine: very rarely    Social Determinants of Health   Financial Resource Strain: Not at Risk (09/10/2022)   Received from [place], [employer]    Financial Resource Strain: 1  Food Insecurity: Not at Risk (09/10/2022)   Received from [place], [place]   Food Insecurity    Food: 1  Transportation Needs: Not at Risk (09/10/2022)   Received from [place], [employer] Needs    Transportation: 1  Physical Activity: Not at Risk (09/10/2022)   Received from [place], [place]   Physical Activity    Physical Activity: 1  Stress: Not at Risk (09/10/2022)   Received from [place], [place]   Stress    Stress: 1  Social Connections: Not at Risk (09/10/2022)   Received from [place], [place]   Social Connections    Social Connections and Isolation: 1    There were no vitals filed for this visit.  There is no height or weight on file to calculate BMI.  Physical Exam  ASSESSMENT AND PLAN: There are no diagnoses linked to this encounter.  There are no diagnoses linked to this encounter.  No  follow-ups on file.  Vonette Grosso G. [place], MD  [facility]. [place] office.

## 2023-02-05 ENCOUNTER — Encounter: Payer: Self-pay | Admitting: Family Medicine

## 2023-02-05 ENCOUNTER — Ambulatory Visit (INDEPENDENT_AMBULATORY_CARE_PROVIDER_SITE_OTHER): Payer: Medicare Other | Admitting: Family Medicine

## 2023-02-05 VITALS — BP 124/70 | HR 64 | Temp 98.1°F | Resp 16 | Ht 64.0 in | Wt 267.4 lb

## 2023-02-05 DIAGNOSIS — Z6841 Body Mass Index (BMI) 40.0 and over, adult: Secondary | ICD-10-CM

## 2023-02-05 DIAGNOSIS — R7401 Elevation of levels of liver transaminase levels: Secondary | ICD-10-CM | POA: Insufficient documentation

## 2023-02-05 DIAGNOSIS — R7303 Prediabetes: Secondary | ICD-10-CM | POA: Diagnosis not present

## 2023-02-05 DIAGNOSIS — F331 Major depressive disorder, recurrent, moderate: Secondary | ICD-10-CM | POA: Insufficient documentation

## 2023-02-05 DIAGNOSIS — E785 Hyperlipidemia, unspecified: Secondary | ICD-10-CM | POA: Diagnosis not present

## 2023-02-05 DIAGNOSIS — G720 Drug-induced myopathy: Secondary | ICD-10-CM

## 2023-02-05 DIAGNOSIS — I10 Essential (primary) hypertension: Secondary | ICD-10-CM

## 2023-02-05 DIAGNOSIS — T466X5A Adverse effect of antihyperlipidemic and antiarteriosclerotic drugs, initial encounter: Secondary | ICD-10-CM

## 2023-02-05 LAB — COMPREHENSIVE METABOLIC PANEL
ALT: 67 U/L — ABNORMAL HIGH (ref 0–35)
AST: 55 U/L — ABNORMAL HIGH (ref 0–37)
Albumin: 4.1 g/dL (ref 3.5–5.2)
Alkaline Phosphatase: 52 U/L (ref 39–117)
BUN: 19 mg/dL (ref 6–23)
CO2: 25 meq/L (ref 19–32)
Calcium: 9.8 mg/dL (ref 8.4–10.5)
Chloride: 104 meq/L (ref 96–112)
Creatinine, Ser: 0.75 mg/dL (ref 0.40–1.20)
GFR: 86.21 mL/min (ref >60.00)
Glucose, Bld: 96 mg/dL (ref 70–99)
Potassium: 3.7 meq/L (ref 3.5–5.1)
Sodium: 138 meq/L (ref 135–145)
Total Bilirubin: 0.8 mg/dL (ref 0.2–1.2)
Total Protein: 6.9 g/dL (ref 6.0–8.3)

## 2023-02-05 NOTE — Assessment & Plan Note (Addendum)
Reviewing records after visit noted elevated transaminases, since 2016, normal alk phosphatase. Evaluated by GI in 09/2014, she has had iron studies, PT/INR, chronic hepatitis serologies, ANA, ceruloplasmin done. 12/2017 abdominal/pelvic CT: Hepatic steatosis,cholelithiasis without evidence of cholecystitis or biliary dilatation, and  mild Aortic Atherosclerosis.  Will consider liver US depending of LFT's numbers.

## 2023-02-05 NOTE — Assessment & Plan Note (Signed)
BP adequately controlled. Continue lisinopril-HCTZ 10-12.5 mg daily as well as low-salt diet. Recommend monitoring BP regularly. Eye exam is current. She follows with cardiologist every 6 months, so I will see her in a year, before if needed.

## 2023-02-05 NOTE — Assessment & Plan Note (Signed)
Following with integrative medicine. Currently she is on semaglutide, which has helped. Encourage consistency with regular physical activity and following a healthful diet. Nutritionist referral placed.

## 2023-02-05 NOTE — Assessment & Plan Note (Signed)
Currently on escitalopram 40 mg daily. She follows with psychiatrist and psychotherapist regularly.

## 2023-02-05 NOTE — Assessment & Plan Note (Addendum)
Hemoglobin A1c 6.2 in 09/2022. Encouraged consistency with a healthy lifestyle for diabetes prevention.

## 2023-02-05 NOTE — Assessment & Plan Note (Signed)
She reports intolerance to statins in the past. Continue Repatha and bempedoic acid. Follows with cardiologist.

## 2023-02-05 NOTE — Patient Instructions (Addendum)
A few things to remember from today's visit:  Prediabetes - Plan: Amb Referral to Nutrition and Diabetic Education, Comprehensive metabolic panel  Essential hypertension, benign - Plan: Comprehensive metabolic panel  No changes today.  If you need refills for medications you take chronically, please call your pharmacy. Do not use My Chart to request refills or for acute issues that need immediate attention. If you send a my chart message, it may take a few days to be addressed, specially if I am not in the office.  Please be sure medication list is accurate. If a new problem present, please set up appointment sooner than planned today.

## 2023-02-05 NOTE — Progress Notes (Addendum)
HPI: [name] is a 62 y.o. female with PMHx significant of PreDM, chorninc lymes disease, HTN,HLD, ataxia, pulmonary embolism in 2008, and migraines (has not had it in a bout 20 years) who is here today to establish care. Former PCP: Dr. Sarah Castillo  She reports chronic lyme disease dx'ed 2016 by integrative medicine provider. She endorses associated aphagia, tremors, "dragging" right leg, and muscular weakness. She is currently on disability due to Lyme disease . She uses CBD to control her symptoms, it helps with aphasia and ataxia. Her speech worsens when she is under stress. Denies any hx of CVA  or seizure disorders.  She has seen neurologist, 2016.  -HLD: She follows with Dr. Mayford Castillo. Previously, she didn't tolerate her statins.  She is currently compliant with 140 mg/ml of repatha q 2 weeks and bempedoic acid.  History of right carotid stenosis. Lab Results  Component Value Date   CHOL 193 01/01/2023   HDL 42 01/01/2023   LDLCALC 122 (H) 01/01/2023   LDLDIRECT 181.4 02/14/2009   TRIG 163 (H) 01/01/2023   CHOLHDL 4.6 (H) 01/01/2023   Lipoprotein a done in 09/2021 was 20.4.  -She is also established with behavioral health Follows up with her counselor once every 2 weeks and last visit with psychiatrist in 05/2022.  She manages her anxiety and depression with Lexapro 40 mg once daily.   -HTN:She has not been monitoring her blood pressure at home, but monitors her HR using her Apple Watch.  She endorses lightheadedness with quick positional changes.  She notes at her dentist she often needs to gradually sit up after reclining backwards to avoid lightheadedness.  Currently she is on Lisinopril-HCT 10-12.5 mg daily. She has an eye exam annually. She also sees cardiologist, follows up with Dr. Mayford Castillo once every 6-8 months.   Lab Results  Component Value Date   NA 141 09/26/2020   CL 105 09/26/2020   K 3.8 09/26/2020   CO2 21 09/26/2020   BUN 18 09/26/2020    CREATININE 0.82 09/26/2020   EGFR 90.0 10/08/2022   CALCIUM 9.4 09/26/2020   ALBUMIN 4.2 09/26/2020   GLUCOSE 134 (H) 09/26/2020   She has been exercising regularly by walking.  Maintains a healthy diet overall, but states there is room for improvement Averages about 7 hours of sleep nightly.   She desires to consult with a nutritionist. She is on semaglutide for weight loss. States that she has lost 20 pounds in the last 40 days on semaglutide. Her husband was recently diagnosed with diabetes and they are seeking a suitable diet.   Prediabetes with a HgA1C 6.2 in 09/2021.  Review of Systems  Constitutional:  Positive for fatigue. Negative for activity change, appetite change and fever.  HENT:  Negative for mouth sores, nosebleeds and sore throat.   Respiratory:  Negative for cough, shortness of breath and wheezing.   Cardiovascular:  Negative for chest pain, palpitations and leg swelling.  Gastrointestinal:  Negative for abdominal pain, nausea and vomiting.       Negative for changes in bowel habits.  Genitourinary:  Negative for decreased urine volume, dysuria and hematuria.  Musculoskeletal:  Positive for gait problem.  Skin:  Negative for rash.  Neurological:  Negative for syncope, weakness and headaches.  Psychiatric/Behavioral:  Negative for confusion and hallucinations.   See other pertinent positives and negatives in HPI.  Current Outpatient Medications on File Prior to Visit  Medication Sig Dispense Refill   Bempedoic Acid (NEXLETOL) 180 MG  TABS Take 1 tablet (180 mg total) by mouth daily. 90 tablet 3   Cholecalciferol (VITAMIN D) 125 MCG (5000 UT) CAPS Take 1 capsule by mouth daily.     Coenzyme Q10 (COQ10) 100 MG CAPS Take 1 capsule by mouth daily.     Cyanocobalamin (VITAMIN B-12) 2500 MCG SUBL Place 1 tablet under the tongue daily.     escitalopram (LEXAPRO) 20 MG tablet Take 2 tablets (40 mg total) by mouth daily. 180 tablet 1   Evolocumab (REPATHA SURECLICK) 140 MG/ML  SOAJ INJECT 1 PEN INTO THE SKIN EVERY 14 DAYS 6 mL 3   lisinopril-hydrochlorothiazide (ZESTORETIC) 10-12.5 MG tablet Take 1 tablet by mouth daily.     magnesium gluconate (MAGONATE) 500 MG tablet Take 500 mg by mouth daily.     meloxicam (MOBIC) 15 MG tablet Take 15 mg by mouth daily as needed for pain.      Milk Thistle 150 MG CAPS Take 150 mg by mouth daily.     Nutritional Supplements (JOINT FORMULA PO) Take 1 tablet by mouth daily.     SEMAGLUTIDE PO Take 48 mg by mouth once a week.     UNABLE TO FIND 10 mg as needed. Med Name: CBD oil     UNABLE TO FIND Med Name: Lion's Mane 1000 mg daily.     UNABLE TO FIND Med Name: Bount[place] Beets 500 mg daily.     No current facility-administered medications on file prior to visit.    Past Medical History:  Diagnosis Date   Anemia, unspecified    Anxiety    Carotid stenosis, right    1-39% by doppler 10/2019   Cavus deformity of foot, acquired    Depression    Dysthymic disorder    Edema    bilateral   Esophageal reflux    Essential hypertension, benign    Hx pulmonary embolism 2006   was on estrogen    Insomnia, unspecified    Major depressive disorder, single episode, unspecified    Migraine, unspecified, without mention of intractable migraine without mention of status migrainosus    Myalgia and myositis, unspecified    Obesity, unspecified    Other and unspecified hyperlipidemia    Polycystic ovaries    Unspecified gastritis and gastroduodenitis without mention of hemorrhage    Allergies  Allergen Reactions   Celecoxib Anaphylaxis    REACTION: ? anaphylactic reaction 10/11 - throat closing, hand itching 1 1/2 hrs after dose but had taken this previously without difficulty. REACTION: ? anaphylactic reaction 10/11 - throat closing, hand itching 1 1/2 hrs after dose but had taken this previously without difficulty.   Codeine Other (See Comments)    REACTION: questionable: mental status changes   Estrogenic Substance Other (See  Comments)    Blood clots   Hydromorphone Hcl Other (See Comments)    Pain  Increases.  blindness   Latex Rash   Pravastatin Sodium Other (See Comments)    REACTION: upper extremity muscle aches - ? anaphylactic reaction 3 days later 03/07/08   Rosuvastatin Other (See Comments)    REACTION: myalgia REACTION: myalgia   Codeine Phosphate     REACTION: questionable: mental status changes   Dilaudid [Hydromorphone Hcl]     Pain  Increases.  blindness   Estrogen    Pravastatin Sodium     REACTION: upper extremity muscle aches - ? anaphylactic reaction 3 days later 03/07/08   Zetia [Ezetimibe]     Stomach cramping, diarrhea    Family  History  Problem Relation Age of Onset   Diabetes Father    Heart disease Father    Liver cancer Father    Pancreatic cancer Father    Gallbladder disease Father    Heart attack Father        x5, first @ 31   Miscarriages / [place] Mother    Alzheimer's disease Mother    Learning disabilities Daughter        45 y.o mental capacity   ADD / ADHD Daughter    Breast cancer Maternal Grandmother    Alzheimer's disease Paternal Grandfather    Alzheimer's disease Maternal Uncle    Hypertension Son    Tourette syndrome Son    Personality disorder Daughter    ADD / ADHD Daughter    Breast cancer Maternal Aunt    Colon cancer Neg Hx     Social History   Socioeconomic History   Marital status: Married    Spouse name: Not on file   Number of children: 3   Years of education: College   Highest education level: Not on file  Occupational History   Occupation: [employer]    Employer: [employer]  Tobacco Use   Smoking status: Never   Smokeless tobacco: Never  Vaping Use   Vaping status: Never Used  Substance and Sexual Activity   Alcohol use: Yes    Alcohol/week: 0.0 - 1.0 standard drinks of alcohol   Drug use: No   Sexual activity: Not Currently    Comment: 1st intercourse- 20, partners- 1, married- 38 yr s  Other Topics Concern    Not on file  Social History Narrative   Married, works as a IT consultant.  She has significant stress from daughter, who is mentally ill and lives at home (age 10).     Caffeine: very rarely    Social Determinants of Health   Financial Resource Strain: Not at Risk (09/10/2022)   Received from [place], [employer]    Financial Resource Strain: 1  Food Insecurity: Not at Risk (09/10/2022)   Received from [place], [place]   Food Insecurity    Food: 1  Transportation Needs: Not at Risk (09/10/2022)   Received from [place], [employer] Needs    Transportation: 1  Physical Activity: Not at Risk (09/10/2022)   Received from [place], [place]   Physical Activity    Physical Activity: 1  Stress: Not at Risk (09/10/2022)   Received from [place], [place]   Stress    Stress: 1  Social Connections: Not at Risk (09/10/2022)   Received from [place], [place]   Social Connections    Social Connections and Isolation: 1    Vitals:   02/05/23 0948  BP: 124/70  Pulse: 64  Resp: 16  Temp: 98.1 F (36.7 C)  SpO2: 97%   Body mass index is 45.89 kg/m.  Physical Exam Vitals and nursing note reviewed.  Constitutional:      General: She is not in acute distress.    Appearance: She is well-developed.  HENT:     Head: Normocephalic and atraumatic.     Mouth/Throat:     Mouth: Mucous membranes are moist.     Pharynx: Oropharynx is clear.  Eyes:     Conjunctiva/sclera: Conjunctivae normal.  Cardiovascular:     Rate and Rhythm: Normal rate and regular rhythm.     Pulses:          Dorsalis pedis pulses are 2+ on the  right side and 2+ on the left side.     Heart sounds: No murmur heard. Pulmonary:     Effort: Pulmonary effort is normal. No respiratory distress.     Breath sounds: Normal breath sounds.  Abdominal:     Palpations: Abdomen is soft. There is no hepatomegaly or mass.     Tenderness: There is no abdominal tenderness.  Lymphadenopathy:     Cervical: No cervical  adenopathy.  Skin:    General: Skin is warm.     Findings: No erythema or rash.  Neurological:     General: No focal deficit present.     Mental Status: She is alert and oriented to person, place, and time.     Cranial Nerves: No cranial nerve deficit.     Comments: Gait is stable, not assisted.  Psychiatric:        Mood and Affect: Mood and affect normal.   ASSESSMENT AND PLAN:  Ms. Armas was seen today to establish care.  Orders Placed This Encounter  Procedures   Comprehensive metabolic panel   Amb Referral to Nutrition and Diabetic Education   Lab Results  Component Value Date   NA 138 02/05/2023   CL 104 02/05/2023   K 3.7 02/05/2023   CO2 25 02/05/2023   BUN 19 02/05/2023   CREATININE 0.75 02/05/2023   GFR 86.21 02/05/2023   CALCIUM 9.8 02/05/2023   ALBUMIN 4.1 02/05/2023   GLUCOSE 96 02/05/2023   Lab Results  Component Value Date   ALT 67 (H) 02/05/2023   AST 55 (H) 02/05/2023   ALKPHOS 52 02/05/2023   BILITOT 0.8 02/05/2023  Prediabetes Assessment & Plan: Hemoglobin A1c 6.2 in 09/2022. Encouraged consistency with a healthy lifestyle for diabetes prevention.  Orders: -     Amb Referral to Nutrition and Diabetic Education -     Comprehensive metabolic panel; Future  Essential hypertension, benign Assessment & Plan: BP adequately controlled. Continue lisinopril-HCTZ 10-12.5 mg daily as well as low-salt diet. Recommend monitoring BP regularly. Eye exam is current. She follows with cardiologist every 6 months, so I will see her in a year, before if needed.  Orders: -     Comprehensive metabolic panel; Future  Hyperlipidemia, unspecified hyperlipidemia type Assessment & Plan: She reports intolerance to statins in the past. Continue Repatha and bempedoic acid. Follows with cardiologist.   Major depressive disorder, recurrent episode, moderate (HCC) Assessment & Plan: Currently on escitalopram 40 mg daily. She follows with psychiatrist and  psychotherapist regularly.   Class 3 severe obesity due to excess calories without serious comorbidity with body mass index (BMI) of 45.0 to 49.9 in adult [facility]) Assessment & Plan: Following with integrative medicine. Currently she is on semaglutide, which has helped. Encourage consistency with regular physical activity and following a healthful diet. Nutritionist referral placed.   Elevated transaminase level Assessment & Plan: Reviewing records after visit noted elevated transaminases, since 2016, normal alk phosphatase. Evaluated by GI in 09/2014, she has had iron studies, PT/INR, chronic hepatitis serologies, ANA, ceruloplasmin done. 12/2017 abdominal/pelvic CT: Hepatic steatosis,cholelithiasis without evidence of cholecystitis or biliary dilatation, and  mild Aortic Atherosclerosis.  Will consider liver US depending of LFT's numbers.   Statin myopathy  She has tried different statins and Zetia, poor tolerance.  Return in about 1 year (around 02/05/2024) for CPE.  I,Rachel Rivera,acting as a scribe for Analea Muller [place], MD.,have documented all relevant documentation on the behalf of Gearl Kimbrough [place], MD,as directed by  Berdell Nevitt [place], MD while in the  presence of Tyan Dy [place], MD.  I, Rowan Pollman [place], MD, have reviewed all documentation for this visit. The documentation on 02/05/23 for the exam, diagnosis, procedures, and orders are all accurate and complete.  Namira Rosekrans G. [place], MD  [facility]. [place] office.

## 2023-02-06 ENCOUNTER — Ambulatory Visit: Payer: Medicare Other | Admitting: Psychiatry

## 2023-02-07 ENCOUNTER — Ambulatory Visit: Payer: Medicare Other | Admitting: Psychiatry

## 2023-02-07 DIAGNOSIS — F4323 Adjustment disorder with mixed anxiety and depressed mood: Secondary | ICD-10-CM

## 2023-02-07 NOTE — Progress Notes (Signed)
[facility] Counselor/Therapist Progress Note  Patient ID: Ann Castillo, MRN: [id number],    Date: 02/07/2023  Time Spent: 53 minutes   Treatment Type: Individual Therapy  Reported Symptoms: situational anxiety, "coming to terms with my situation"," anger not a problem currently"   Mental Status Exam:  Appearance:   Casual and Neat     Behavior:  Appropriate  Motor:  Normal  Speech/Language:   Clear and Coherent  Affect:  Appropriate  Mood:  anxious  Thought process:  anxious  Thought content:    WNL  Sensory/Perceptual disturbances:    WNL  Orientation:  oriented to person, place, time/date, situation, day of week, month of year, year, and stated date of Sept. 12, 2024  Attention:  Fair  Concentration:  Fair  Memory:  WNL  Fund of knowledge:   Good  Insight:    Good and Fair  Judgment:   Good  Impulse Control:  Good   Risk Assessment: Danger to Self:  No Self-injurious Behavior: No Danger to Others: No Duty to Warn:no Physical Aggression / Violence:No  Access to Firearms a concern: No  Gang Involvement:No   Subjective:  Patient in for session today and reporting less anger, still having anxiety, but feeling I'm coming to terms more with my situation and stop trying to make things happen with adult daughter "Page" (61) and "let go". Don't have a lot of hope about "the future of our world" and explained this in more detail. "I can't have what I want with my daughter, it doesn't exist." Processed her continued hurt, and disappointment, but also the realization she" can't stay in limbo and getting hurt frequently."States daughter has been problematic since she was 61 yr old and is now an adult with same outlook and behaviors that are just more hard core. Doesn't see her daughter  ever coming back into patient's life and "it probably would not be healthy if she did but would leave the door unlocked in case she ever did try to become a part of patient's life again."  Mood is still one of "mourning loss of relationship with daughter but is walking away from it as daughter doesn't want a relationship with patient and currently it has been too emotionally abusive."  Trying not to focus on negatives, have more realistic expectations, and more self-acceptance and self-awareness that she is a good person. 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 Watched some videos on TV that she thought was helpful but realizes now that they were not helpful and stopping her watching of the videos. Issues of regret and guilt surfacing now which we talked more about in session today.  Self-acceptance is improving some.  Overall outlook, per patient report, seems to have more gradual hope.  Accepting more that she cannot control her adult daughter know what she thinks or feels.  Working to let go of trying to fix everything for others and her self-doubt.  Interventions: Cognitive Behavioral Therapy and Ego-Supportive  1.Reduce overall level, frequency, and intensity of her anxiety so that her motivation and follow through on goals is not  impaired. 2.Enhance her ability to handle effectively the full variety of life's anxieties. 3.Identify the major conflicts and present that form the basis for present anxiety.    Diagnosis:   ICD-10-CM   1. Situational mixed anxiety and depressive disorder  F43.23      Plan:  Patient in session today participating well and trying to be more open to possibilities  of her situation improving.  It is progress for her to have gotten into therapy and making some effort to work with goals that can help her eventually live a healthier and more emotionally grounded life.  Does need to continue working with her goal-directed behaviors to move and a more hopeful and forward direction.  Goal review and progress/challenges noted with patient.  Next appointment within 3 weeks.   Mathis Fare, LCSW

## 2023-02-14 ENCOUNTER — Ambulatory Visit
Admission: RE | Admit: 2023-02-14 | Discharge: 2023-02-14 | Disposition: A | Discharge status: Home / Self Care | Payer: Medicare Other | Source: Ambulatory Visit | Attending: Family Medicine | Admitting: Family Medicine

## 2023-02-14 DIAGNOSIS — R7401 Elevation of levels of liver transaminase levels: Secondary | ICD-10-CM

## 2023-02-14 DIAGNOSIS — K8020 Calculus of gallbladder without cholecystitis without obstruction: Secondary | ICD-10-CM | POA: Diagnosis not present

## 2023-02-18 ENCOUNTER — Ambulatory Visit: Payer: Medicare Other | Admitting: Physician Assistant

## 2023-02-18 ENCOUNTER — Encounter: Payer: Self-pay | Admitting: Physician Assistant

## 2023-02-18 DIAGNOSIS — F4323 Adjustment disorder with mixed anxiety and depressed mood: Secondary | ICD-10-CM | POA: Diagnosis not present

## 2023-02-18 DIAGNOSIS — E6609 Other obesity due to excess calories: Secondary | ICD-10-CM

## 2023-02-18 NOTE — Progress Notes (Unsigned)
[facility] Med Check  Patient ID: Ann Castillo,  MRN: [id number]  PCP: [place], Sarah T, MD  Date of Evaluation: 02/18/2023 Time spent:20 minutes  Chief Complaint:  Chief Complaint   Depression; Anxiety; Follow-up    HISTORY/CURRENT STATUS: HPI Routine Med check  We increased the Lexapro 2 months ago. That plus therapy has helped her feel better.  She does have some family stress with one of her adult daughters, they have not spoken for over a year.  She knows that the daughter has had a baby but will not tell her or where she is living.  That has been hard but therapy has helped.  Patient is able to enjoy things.  Energy and motivation are good.  No extreme sadness, tearfulness, or feelings of hopelessness.  Sleeps well most of the time. ADLs and personal hygiene are normal.   Denies any changes in concentration, making decisions, or remembering things.  Appetite has not changed.  Weight is stable.  Has recently started Ozempic.  No complaints of anxiety except nervous about the upcoming election.  Denies suicidal or homicidal thoughts.  Patient denies increased energy with decreased need for sleep, increased talkativeness, racing thoughts, impulsivity or risky behaviors, increased spending, increased libido, grandiosity, increased irritability or anger, paranoia, or hallucinations.  Denies dizziness, syncope, seizures, numbness, tingling, tremor, tics, unsteady gait, slurred speech, confusion. Denies muscle or joint pain, stiffness, or dystonia. Denies unexplained weight loss, frequent infections, or sores that heal slowly.  No polyphagia, polydipsia, or polyuria. Denies visual changes or paresthesias.   Individual Medical History/ Review of Systems: Changes? :No      Past medications for mental health diagnoses include: Lexapro, trazodone, loxapine, Ambien, Klonopin, melatonin   Allergies: Celecoxib, Codeine, Estrogenic substance, Hydromorphone hcl, Latex, Pravastatin sodium,  Rosuvastatin, Codeine phosphate, Dilaudid [hydromorphone hcl], Estrogen, Pravastatin sodium, and Zetia [ezetimibe]  Current Medications:  Current Outpatient Medications:    Bempedoic Acid (NEXLETOL) 180 MG TABS, Take 1 tablet (180 mg total) by mouth daily., Disp: 90 tablet, Rfl: 3   Cholecalciferol (VITAMIN D) 125 MCG (5000 UT) CAPS, Take 1 capsule by mouth daily., Disp: , Rfl:    Coenzyme Q10 (COQ10) 100 MG CAPS, Take 1 capsule by mouth daily., Disp: , Rfl:    Cyanocobalamin (VITAMIN B-12) 2500 MCG SUBL, Place 1 tablet under the tongue daily., Disp: , Rfl:    escitalopram (LEXAPRO) 20 MG tablet, Take 2 tablets (40 mg total) by mouth daily., Disp: 180 tablet, Rfl: 1   Evolocumab (REPATHA SURECLICK) 140 MG/ML SOAJ, INJECT 1 PEN INTO THE SKIN EVERY 14 DAYS, Disp: 6 mL, Rfl: 3   lisinopril-hydrochlorothiazide (ZESTORETIC) 10-12.5 MG tablet, Take 1 tablet by mouth daily., Disp: , Rfl:    magnesium gluconate (MAGONATE) 500 MG tablet, Take 500 mg by mouth daily., Disp: , Rfl:    meloxicam (MOBIC) 15 MG tablet, Take 15 mg by mouth daily as needed for pain. , Disp: , Rfl:    Milk Thistle 150 MG CAPS, Take 150 mg by mouth daily., Disp: , Rfl:    Nutritional Supplements (JOINT FORMULA PO), Take 1 tablet by mouth daily., Disp: , Rfl:    SEMAGLUTIDE PO, Take 48 mg by mouth once a week., Disp: , Rfl:    UNABLE TO FIND, 10 mg as needed. Med Name: CBD oil, Disp: , Rfl:    UNABLE TO FIND, Med Name: [place]ount[place] Beets 500 mg daily., Disp: , Rfl:    UNABLE TO FIND, Med Name: Lion's Mane 1000 mg daily. (Patient  not taking: Reported on 02/18/2023), Disp: , Rfl:  Medication Side Effects: none  Family Medical/ Social History: Changes?  No  MENTAL HEALTH EXAM:  Last menstrual period 12/26/2012.There is no height or weight on file to calculate BMI.  General Appearance: Casual, Well Groomed, and morbidly obese  Eye Contact:  Good  Speech:  Clear and Coherent and Normal Rate  Volume:  Normal  Mood:  Euthymic   Affect:  Congruent  Thought Process:  Goal Directed and Descriptions of Associations: Circumstantial  Orientation:  Full (Time, Place, and Person)  Thought Content: Logical   Suicidal Thoughts:  No  Homicidal Thoughts:  No  Memory:  WNL  Judgement:  Good  Insight:  Good  Psychomotor Activity:  Normal  Concentration:  Concentration: Good and Attention Span: Good  Recall:  Good  Fund of Knowledge: Good  Language: Good  Assets:  Desire for Improvement Financial Resources/Insurance Housing Transportation Vocational/Educational  ADL's:  Intact  Cognition: WNL  Prognosis:  Good   DIAGNOSES:    ICD-10-CM   1. Situational mixed anxiety and depressive disorder  F43.23     2. Obesity due to excess calories, unspecified classification, unspecified whether serious comorbidity present  E66.09      Receiving Psychotherapy: Yes with Rockne Menghini, LCSW  RECOMMENDATIONS:  PDMP reviewed.  No controlled substances. I provided 20 minutes of face to face time during this encounter, including time spent before and after the visit in records review, medical decision making, counseling pertinent to today's visit, and charting.   She is doing better so no change in treatment necessary.  Continue Lexapro 20 mg, 2 po daily. Continue vitamins and supplements from her integrative doctor. Continue therapy with Rockne Menghini, LCSW. Return in February 2025.  Melony Overly, PA-C

## 2023-02-19 ENCOUNTER — Encounter: Payer: Self-pay | Admitting: Physician Assistant

## 2023-02-19 ENCOUNTER — Ambulatory Visit: Payer: Medicare Other | Admitting: Psychiatry

## 2023-02-20 ENCOUNTER — Ambulatory Visit: Payer: Medicare Other | Admitting: Psychiatry

## 2023-03-11 ENCOUNTER — Ambulatory Visit: Payer: Medicare Other | Admitting: Psychiatry

## 2023-03-11 DIAGNOSIS — F4323 Adjustment disorder with mixed anxiety and depressed mood: Secondary | ICD-10-CM

## 2023-03-11 NOTE — Progress Notes (Signed)
[facility] Counselor/Therapist Progress Note  Patient ID: Ann Castillo, MRN: [id number],    Date: 03/11/2023  Time Spent: 53 minutes   Treatment Type: Individual Therapy  Reported Symptoms:  situational anxiety, resentment, fear, "I feel I'm being gas-lighted by my son, my perfect son"   Mental Status Exam:  Appearance:   Well Groomed     Behavior:  Appropriate, Sharing, and Motivated  Motor:  Normal  Speech/Language:   Clear and Coherent  Affect:  anxious  Mood:  anxious  Thought process:  goal directed  Thought content:    WNL  Sensory/Perceptual disturbances:    WNL  Orientation:  oriented to person, place, time/date, situation, day of week, month of year, year, and stated date of Oct. 14, 2024  Attention:  Fair  Concentration:  Fair  Memory:  WNL  Fund of knowledge:   Good  Insight:    Good  Judgment:   Good  Impulse Control:  Good   Risk Assessment: Danger to Self:  No Self-injurious Behavior: No Danger to Others: No Duty to Warn:no Physical Aggression / Violence:No  Access to Firearms a concern: No  Gang Involvement:No   Subjective:   Patient in session today reporting situational anxiety, resentment, fear and "I feel that I'm being gas-lighted by son." Talked about situation with adult son in his 55's who is "overboard" in his comments to patient about her being overweight and ws very hurtful to patient. Processed this and other situations within family that have been hurtful since last appointment. Reports plans to be with son and his family tomorrow again and has spoken with son "to try and plan for a much better time together. Patient needing to vent her concerns re: family and lots of fears she is having re: "our country and all the chaos going on now".  She was able to vent freely which seemed helpful and bring her back to what our original focus was on her handling situations more proactively and being able to handle situations when she feels hurt  specially within the family, as this tends to feed anxiety and "not fitting in".  Some improved self acceptance.  Some hope gradually.  Trying to except more of what she can control and what she cannot, including other people.  Denies any thoughts to harm self or others.  Continues to work on "control" issues as that is very important to her and is visible within other family members and is an issue that emerges frequently.  Continues to work on her self-doubt and trying not to control or fix everything for others.  Interventions: Cognitive Behavioral Therapy and Ego-Supportive  1.Reduce overall level, frequency, and intensity of her anxiety so that her motivation and follow through on goals is not  impaired. 2.Enhance her ability to handle effectively the full variety of life's anxieties. 3.Identify the major conflicts and present that form the basis for present anxiety.    Diagnosis:   ICD-10-CM   1. Situational mixed anxiety and depressive disorder  F43.23      Plan:  Patient in for session today showing good participation and motivation as we worked further on her resentment, situational anxiety, fears, and relationship issues within the family.  Patient has shown some progress and needs to continue working with goal-directed behaviors in order to move in a positive direction that includes healthier relationships and being more emotionally grounded.  Goal review and progress/challenges noted with patient.  Next appointment within 3 weeks.  Mathis Fare, LCSW

## 2023-04-03 ENCOUNTER — Other Ambulatory Visit: Payer: Self-pay | Admitting: *Deleted

## 2023-04-03 DIAGNOSIS — E7800 Pure hypercholesterolemia, unspecified: Secondary | ICD-10-CM

## 2023-04-05 ENCOUNTER — Ambulatory Visit: Payer: Medicare Other

## 2023-04-15 ENCOUNTER — Ambulatory Visit: Payer: Medicare Other | Admitting: Psychiatry

## 2023-04-15 DIAGNOSIS — F4323 Adjustment disorder with mixed anxiety and depressed mood: Secondary | ICD-10-CM | POA: Diagnosis not present

## 2023-04-15 NOTE — Progress Notes (Signed)
      [facility] Counselor/Therapist Progress Note  Patient ID: Ann Castillo, MRN: [id number],    Date: 04/15/2023  Time Spent: 55 minutes   Treatment Type: Individual Therapy  Reported Symptoms: situational anxiety, resentment, fear, angry "people in general and my husband in particular"   Mental Status Exam:  Appearance:   Casual     Behavior:  Appropriate, Sharing, and Motivated  Motor:  Normal  Speech/Language:   Clear and Coherent  Affect:  Anxious, angry  Mood:  angry and anxious  Thought process:  normal  Thought content:    Rumination  Sensory/Perceptual disturbances:    WNL  Orientation:  oriented to person, place, time/date, situation, day of week, month of year, year, and stated date of Nov. 18, 2024  Attention:  Fair  Concentration:  Good  Memory:  WNL  Fund of knowledge:   Good  Insight:    Good and Fair  Judgment:   Good  Impulse Control:  Good and Fair   Risk Assessment: Danger to Self:  No Self-injurious Behavior: No Danger to Others: No Duty to Warn:no Physical Aggression / Violence:No  Access to Firearms a concern: No  Gang Involvement:No   Subjective:   Patient here for session today reporting situational anxiety, fear, resentment. Very frustrated, anxious, and angry re: birth of grandchild which was hidden from patient for 2 months. Difficulty still today in patient accepting her difficulty with certain family matters and communication with person. Continues to struggle with this and hard to "let go to move forward". Concerned about the confidentiality of her situation involving herself and family with "significant' issues.  Was able to let go of some of her anger. Feelings of anger about situations with in family. (Not all details included in this note due to patient request and confidentiality.) "In some way, I feel better about myself and standing up for myself, but really struggling within herself and family." Also admits impacted by current  state of our nation and all the changes happening and presumed to happen due to election results.  Further processing this and other family situations that she repeated are confidential in nature.  Continues to work on control issues seeing clearly that she cannot control everything and especially a lot of the things within the family.  Plans to return for next appointment within 3 weeks, after the Thanksgiving holidays.  Interventions: Cognitive Behavioral Therapy and Ego-Supportive  1.Reduce overall level, frequency, and intensity of her anxiety so that her motivation and follow through on goals is not  impaired. 2.Enhance her ability to handle effectively the full variety of life's anxieties. 3.Identify the major conflicts and present that form the basis for present anxiety.   Diagnosis:   ICD-10-CM   1. Situational mixed anxiety and depressive disorder  F43.23      Plan:  Patient in session today focusing more on her resentment, fears, relationship issues within the family, and situational anxiety.  Some progress she has already shown and definitely needs to continue working with goal-directed behaviors to move more in a positive direction that would include feeling more emotionally grounded, having healthier relationships, and being able to have more of a sense of peace for herself.  Goal review and progress/challenges noted with patient.  Next appointment within approximately 3 weeks.   Mathis Fare, LCSW

## 2023-04-16 ENCOUNTER — Other Ambulatory Visit: Payer: Medicare Other

## 2023-04-18 ENCOUNTER — Ambulatory Visit: Payer: Medicare Other

## 2023-04-29 ENCOUNTER — Ambulatory Visit: Payer: Medicare Other | Attending: Nurse Practitioner

## 2023-04-29 DIAGNOSIS — E7800 Pure hypercholesterolemia, unspecified: Secondary | ICD-10-CM | POA: Diagnosis not present

## 2023-04-30 LAB — LIPID PANEL
Chol/HDL Ratio: 2.9 {ratio} (ref 0.0–4.4)
Cholesterol, Total: 129 mg/dL (ref 100–199)
HDL: 45 mg/dL (ref >39)
LDL Chol Calc (NIH): 59 mg/dL (ref 0–99)
Triglycerides: 146 mg/dL (ref 0–149)
VLDL Cholesterol Cal: 25 mg/dL (ref 5–40)

## 2023-04-30 LAB — ALT: ALT: 38 [IU]/L — ABNORMAL HIGH (ref 0–32)

## 2023-05-09 ENCOUNTER — Ambulatory Visit: Payer: Medicare Other | Admitting: Psychiatry

## 2023-05-09 DIAGNOSIS — F4323 Adjustment disorder with mixed anxiety and depressed mood: Secondary | ICD-10-CM

## 2023-05-09 NOTE — Progress Notes (Signed)
[facility] Counselor/Therapist Progress Note  Patient ID: [name], MRN: [id number],    Date: 05/09/2023  Time Spent: 50 minutes   Treatment Type: Individual Therapy  Reported Symptoms: situational anxiety, "anger with people in general and husband in particular"; began acting unusual as we got into session and stood up abruptly and pacing around office, not threatening but behaving unusual, talking almost non-stop about lyme disease and her family and other random topics. Did respond at one point and said she was diagnosed with Lyme disease.  (See further notes below).  Mental Status Exam:  Appearance:   Casual     Behavior:  Appropriate, Sharing, and Motivated  Motor:  Normal-changed during session as she began having a medical response related to Lyme disease  Speech/Language:   Had a flareup of "lyme disease" which affects her speech and causes some "stammering and stuttering and I have to pull and force our my words"  Affect:  anxious  Mood:  anxious  Thought process:  goal directed  Thought content:    Rumination and some obsessive thoughts  Sensory/Perceptual disturbances:    WNL  Orientation:  oriented to person, place, time/date, situation, day of week, month of year, year, and stated date of Dec. 12, 2024  Attention:  Fair  Concentration:  Fair and Poor  Memory:  WNL  Fund of knowledge:   Good  Insight:    Good  Judgment:   Good  Impulse Control:  Good   Risk Assessment: Danger to Self:  No Self-injurious Behavior: No Danger to Others: No Duty to Warn:no Physical Aggression / Violence:No  Access to Firearms a concern: No  Gang Involvement:No   Subjective:   Patient in session today reporting symptoms of situational anxiety, fear, resentment especially with husband, frustrated mostly about personal/family/ and the world.  We processed some of the symptoms and issues and then patient began acting differently as noted above, and exhibiting involuntary  movements, walking around with no specific purpose, stated she could not sit down, speech was affected but she still can talk and she explained that it was "due to her Lyme disease".  She has not mentioned this to me in the past and she is a newer patient to me.  She was not violent but just continued the pacing, almost nonstop talking, very emphatic and some of her speech, but of no harm to self or anyone else.  This obviously took over our session and I Ann patient vent her concerns which was quite different from how she normally is and being able to carry on a logical conversation.  A nurse in our practice to check in to make sure patient was okay and we did contact her husband who came to pick her up at the time near her session end.  They both stated that CBD oil can and has helped her symptoms like this previously however we did not have any at the office nor did patient have any on her.  She was very appreciative of how the situation was handled and she still was able to talk through some of her issues and did not have to go through this latest incident alone.  She is being approximately 3 weeks.  I have also reached out to inform her med provider of what occurred and patient session today.  Upon patient's return, we will pick up from our discussion and work that we began at the earlier part of session today.  She did  leave voluntarily with her husband and he was transporting her home.  Interventions: Cognitive Behavioral Therapy and Ego-Supportive  1.Reduce overall level, frequency, and intensity of her anxiety so that her motivation and follow through on goals is not  impaired. 2.Enhance her ability to handle effectively the full variety of life's anxieties. 3.Identify the major conflicts and present that form the basis for present anxiety.   Diagnosis:   ICD-10-CM   1. Situational mixed anxiety and depressive disorder  F43.23      Plan:  As noted above, patient did arrive for session and began  working on issues regarding her family, some situational anxiety, and some frustrations with personal/family/world situations.  Also as noted above shortly after she had spoken for a few minutes about these things, her behavior changed as did her speech.  Patient reported that the behavior change was due to her lung disease and "occasionally flares up."  She was cooperative and never presented as a threat.  The plan is to follow-up with her again in approximately 3 weeks for her next appointment.  She has earlier made progress and needs to continue working with goal-directed behaviors to move in a more healthier and positive direction.  Goal review and progress/challenges noted with patient.  Next appointment within approximately 3 weeks.   Ann Fare, LCSW

## 2023-05-16 ENCOUNTER — Other Ambulatory Visit: Payer: Self-pay | Admitting: Physician Assistant

## 2023-05-16 NOTE — Telephone Encounter (Signed)
Please schedule pt's fu, due in Feb.

## 2023-05-16 NOTE — Telephone Encounter (Signed)
Voice mail is full  

## 2023-05-28 ENCOUNTER — Ambulatory Visit: Payer: Medicare Other | Admitting: Psychiatry

## 2023-06-11 ENCOUNTER — Ambulatory Visit: Payer: Medicare Other | Admitting: Psychiatry

## 2023-06-18 ENCOUNTER — Other Ambulatory Visit (HOSPITAL_COMMUNITY): Payer: Self-pay

## 2023-06-18 ENCOUNTER — Encounter: Payer: Self-pay | Admitting: Pharmacy Technician

## 2023-06-18 ENCOUNTER — Telehealth: Payer: Self-pay | Admitting: Cardiology

## 2023-06-18 ENCOUNTER — Telehealth: Payer: Self-pay | Admitting: Pharmacy Technician

## 2023-06-18 DIAGNOSIS — H2513 Age-related nuclear cataract, bilateral: Secondary | ICD-10-CM | POA: Diagnosis not present

## 2023-06-18 DIAGNOSIS — H5203 Hypermetropia, bilateral: Secondary | ICD-10-CM | POA: Diagnosis not present

## 2023-06-18 DIAGNOSIS — H52223 Regular astigmatism, bilateral: Secondary | ICD-10-CM | POA: Diagnosis not present

## 2023-06-18 DIAGNOSIS — H35033 Hypertensive retinopathy, bilateral: Secondary | ICD-10-CM | POA: Diagnosis not present

## 2023-06-18 DIAGNOSIS — H524 Presbyopia: Secondary | ICD-10-CM | POA: Diagnosis not present

## 2023-06-18 NOTE — Telephone Encounter (Signed)
Pt c/o medication issue:  1. Name of Medication: Bempedoic Acid (NEXLETOL) 180 MG TABS   Evolocumab (REPATHA SURECLICK) 140 MG/ML SOAJ    2. How are you currently taking this medication (dosage and times per day)?  Take 1 tablet (180 mg total) by mouth daily.     INJECT 1 PEN INTO THE SKIN EVERY 14 DAYS      3. Are you having a reaction (difficulty breathing--STAT)? No  4. What is your medication issue? Pt is requesting a callback regarding her trying to pick up these medications but she was informed her grant has expired so instead of it being $0.00 it's now $300.00 plus so she'd like to discuss a grant being done again. Please advise

## 2023-06-18 NOTE — Telephone Encounter (Signed)
Patient Advocate Encounter   The patient was approved for a [employer] grant that will help cover the cost of Repatha and Nexletol Total amount awarded, $2,500.  Effective: 06/12/23 - 06/10/24   [id number]   I will call the Pharmacy to provide them with approval and processing information. Patient was informed via mychart

## 2023-06-18 NOTE — Telephone Encounter (Signed)
Patient assistance forms has been Approved. New Encounter created for follow up. For additional info see Pharmacy medication assitance telephone encounter from 06/18/23.

## 2023-06-21 DIAGNOSIS — M1711 Unilateral primary osteoarthritis, right knee: Secondary | ICD-10-CM | POA: Diagnosis not present

## 2023-06-21 DIAGNOSIS — M170 Bilateral primary osteoarthritis of knee: Secondary | ICD-10-CM | POA: Diagnosis not present

## 2023-06-21 DIAGNOSIS — M1712 Unilateral primary osteoarthritis, left knee: Secondary | ICD-10-CM | POA: Diagnosis not present

## 2023-06-25 ENCOUNTER — Ambulatory Visit: Payer: Medicare Other | Admitting: Psychiatry

## 2023-06-25 DIAGNOSIS — F4323 Adjustment disorder with mixed anxiety and depressed mood: Secondary | ICD-10-CM

## 2023-06-25 NOTE — Progress Notes (Signed)
[facility] Counselor/Therapist Progress Note  Patient ID: Ann Castillo, MRN: [id number],    Date: 06/25/2023  Time Spent: 53 minutes   Treatment Type: Individual Therapy  Reported Symptoms:  anxiety, anger with people, confusion, distracted thoughts   Mental Status Exam:  Appearance:   Casual     Behavior:  Appropriate, Sharing, and Motivated  Motor:  Normal  Speech/Language:   Fairly normal except some slight speech difference similar to last session when she experienced a "reaction to lyme disease"  Affect:  anxious  Mood:  anxious  Thought process:  goal directed  Thought content:    "I'm having a harder time concentrating  Sensory/Perceptual disturbances:    WNL  Orientation:  oriented to person, place, time/date, situation, day of week, month of year, year, and stated date of Jan. 28, 2025  Attention:  Fair  Concentration:  Good  Memory:  WNL  Fund of knowledge:   Good  Insight:    Fair  Judgment:   Fair  Impulse Control:  Good   Risk Assessment: Danger to Self:  No Self-injurious Behavior: No Danger to Others: No Duty to Warn:no Physical Aggression / Violence:No  Access to Firearms a concern: No  Gang Involvement:No   Subjective: Patient in today reporting anxiety, anger with others, confusion, distracted thoughts. States her anxiety now is mostly about the country and all the "crazy" stuff that goes on these days in our country and am concerned about the future. Is having some more of the mild tremors and speech is somewhat fractured and sometimes searches for words. States the only med she has used successfully is CBD. Has had sinus issues and inflammation  past 24 hrs and she feels that may have instigated the episode at last session. Easy to get overwhelmed. Pacing herself helps, and being intentional and setting limits does help. Was able to maintain and stay in session, and able to be goal-directed in session today, despite some change in speech.   "I'd like to be able to not feel like the weight of my family is on me , including all my family." "I feel things are my fault whether they are or not." Feels she has more power over things than she really does. "Gets frustrated that I'm not doing enough or what I should be doing."  Interventions: Cognitive Behavioral Therapy and Ego-Supportive  1.Reduce overall level, frequency, and intensity of her anxiety so that her motivation and follow through on goals is not  impaired. 2.Enhance her ability to handle effectively the full variety of life's anxieties. 3.Identify the major conflicts and present that form the basis for present anxiety.    Diagnosis:   ICD-10-CM   1. Situational mixed anxiety and depressive disorder  F43.23      Plan: Patient today working further on her situational anxiety and frustrations within her family and herself. Some low-grade symptoms similar to last session when she had the "neurological episode that she experienced due to prior lyme disease ." Very mild compared to last session when it occurred in a more pronounced way. Did take some CBD prior to session which seemed to help. She has made some earlier progress and needs to continue working with goal-directed behaviors to move in a more healthier and hopeful direction.   Goal review and progress/challenges noted with patient.  Next appt within 3 weeks.   Mathis Fare, LCSW

## 2023-06-26 ENCOUNTER — Ambulatory Visit (INDEPENDENT_AMBULATORY_CARE_PROVIDER_SITE_OTHER): Payer: Medicare Other | Admitting: Physician Assistant

## 2023-06-26 ENCOUNTER — Encounter: Payer: Self-pay | Admitting: Physician Assistant

## 2023-06-26 DIAGNOSIS — F32A Depression, unspecified: Secondary | ICD-10-CM | POA: Diagnosis not present

## 2023-06-26 DIAGNOSIS — F418 Other specified anxiety disorders: Secondary | ICD-10-CM | POA: Diagnosis not present

## 2023-06-26 DIAGNOSIS — G4700 Insomnia, unspecified: Secondary | ICD-10-CM | POA: Diagnosis not present

## 2023-06-26 MED ORDER — GABAPENTIN 300 MG PO CAPS: 300.0000 mg | ORAL_CAPSULE | Freq: Every evening | ORAL | 1 refills | Status: DC

## 2023-06-26 NOTE — Progress Notes (Signed)
[facility] Med Check  Patient ID: Ann Castillo,  MRN: [id number]  PCP: [place], Sarah T, MD  Date of Evaluation: 06/26/2023 Time spent:25 minutes  Chief Complaint:  Chief Complaint   Anxiety; Depression; Follow-up    HISTORY/CURRENT STATUS: HPI Routine Med check  Is having more anxiety. It's really affecting her daily life. She's having more 'episodes' of clipped speech, which is the most common part of an episode. But she's also having more times of agitation when she can't sit still and feels like she wants to jump out of her skin.  These episodes started after she was diagnosed with Lyme disease several years back.  She is not having any neurologic deficits.  Denies panic attacks.  She is very upset that Garnet Koyanagi was Network engineer.  She "cannot stand him or the people who voted for him."  Patient feels like those who did are uneducated idiots and the country is going to suffer b/c of their stupidity.  She is trying to avoid the news so as not to be triggered.  She is able to enjoy some things.  Energy and motivation are good.  Work is going well.   No extreme sadness, tearfulness, or feelings of hopelessness.  Sleeps well most of the time. ADLs and personal hygiene are normal.   Denies any changes in concentration, making decisions, or remembering things.  Appetite has not changed.  Weight is stable.  Denies suicidal or homicidal thoughts.  Patient denies increased energy with decreased need for sleep, increased talkativeness, racing thoughts, impulsivity or risky behaviors, increased spending, increased libido, grandiosity, increased irritability or anger, paranoia, or hallucinations.  Denies dizziness, syncope, seizures, numbness, tingling, tremor, tics, unsteady gait, slurred speech, confusion. Denies muscle or joint pain, stiffness, or dystonia. Denies unexplained weight loss, frequent infections, or sores that heal slowly.  No polyphagia, polydipsia, or polyuria.  Denies visual changes or paresthesias.   Individual Medical History/ Review of Systems: Changes? :No      Past medications for mental health diagnoses include: Lexapro, trazodone, loxapine, Ambien, Klonopin, melatonin   Allergies: Celecoxib, Codeine, Estrogenic substance, Hydromorphone hcl, Latex, Pravastatin sodium, Rosuvastatin, Codeine phosphate, Dilaudid [hydromorphone hcl], Estrogen, Pravastatin sodium, and Zetia [ezetimibe]  Current Medications:  Current Outpatient Medications:    Bempedoic Acid (NEXLETOL) 180 MG TABS, Take 1 tablet (180 mg total) by mouth daily., Disp: 90 tablet, Rfl: 3   Cholecalciferol (VITAMIN D) 125 MCG (5000 UT) CAPS, Take 1 capsule by mouth daily., Disp: , Rfl:    Coenzyme Q10 (COQ10) 100 MG CAPS, Take 1 capsule by mouth daily., Disp: , Rfl:    Cyanocobalamin (VITAMIN B-12) 2500 MCG SUBL, Place 1 tablet under the tongue daily., Disp: , Rfl:    escitalopram (LEXAPRO) 20 MG tablet, TAKE 2 TABLETS BY MOUTH DAILY, Disp: 180 tablet, Rfl: 0   Evolocumab (REPATHA SURECLICK) 140 MG/ML SOAJ, INJECT 1 PEN INTO THE SKIN EVERY 14 DAYS, Disp: 6 mL, Rfl: 3   gabapentin (NEURONTIN) 300 MG capsule, Take 1 capsule (300 mg total) by mouth every evening., Disp: 30 capsule, Rfl: 1   lisinopril-hydrochlorothiazide (ZESTORETIC) 10-12.5 MG tablet, Take 1 tablet by mouth daily., Disp: , Rfl:    magnesium gluconate (MAGONATE) 500 MG tablet, Take 500 mg by mouth daily., Disp: , Rfl:    meloxicam (MOBIC) 15 MG tablet, Take 15 mg by mouth daily as needed for pain. , Disp: , Rfl:    Milk Thistle 150 MG CAPS, Take 150 mg by mouth daily., Disp: , Rfl:  Nutritional Supplements (JOINT FORMULA PO), Take 1 tablet by mouth daily., Disp: , Rfl:    SEMAGLUTIDE PO, Take 48 mg by mouth once a week., Disp: , Rfl:    UNABLE TO FIND, 10 mg as needed. Med Name: CBD oil, Disp: , Rfl:    UNABLE TO FIND, Med Name: [place] 500 mg daily., Disp: , Rfl:    UNABLE TO FIND, Med Name: [place] 1000 mg  daily. (Patient not taking: Reported on 06/26/2023), Disp: , Rfl:  Medication Side Effects: none  Family Medical/ Social History: Changes?  No  MENTAL HEALTH EXAM:  Last menstrual period 12/26/2012.There is no height or weight on file to calculate BMI.  General Appearance: Casual, Well Groomed, and morbidly obese  Eye Contact:  Good  Speech:  Clear and Coherent and Normal Rate  Volume:  Normal  Mood:  Irritable  Affect:  Congruent  Thought Process:  Goal Directed and Descriptions of Associations: Circumstantial  Orientation:  Full (Time, Place, and Person)  Thought Content: Logical   Suicidal Thoughts:  No  Homicidal Thoughts:  No  Memory:  WNL  Judgement:  Good  Insight:  Good  Psychomotor Activity:  Normal  Concentration:  Concentration: Good and Attention Span: Good  Recall:  Good  Fund of Knowledge: Good  Language: Good  Assets:  Desire for Improvement Financial Resources/Insurance Housing Transportation Vocational/Educational  ADL's:  Intact  Cognition: WNL  Prognosis:  Good   DIAGNOSES:    ICD-10-CM   1. Mild depression  F32.A     2. Situational anxiety  F41.8     3. Insomnia, unspecified type  G47.00       Receiving Psychotherapy: Yes with Rockne Menghini, LCSW  RECOMMENDATIONS:  PDMP reviewed.  No controlled substances. I provided 25 minutes of face to face time during this encounter, including time spent before and after the visit in records review, medical decision making, counseling pertinent to today's visit, and charting.   We discussed the symptoms of increased anxiety and anger because President Trump is in office.  I agree that she should avoid the news.  That only triggers her more.  I recommend starting gabapentin to help with anxiety.  Benefits, risks and side effects were discussed and she accepts.  She would like to start at a very low dose and increase if needed.  Continue Lexapro 20 mg, 2 po daily. Start gabapentin 300 mg, 1 p.o.  nightly. Continue vitamins and supplements from her integrative doctor. Continue therapy with Rockne Menghini, LCSW. Return in 6 wks  Melony Overly, [place]

## 2023-07-17 ENCOUNTER — Other Ambulatory Visit: Payer: Self-pay | Admitting: Physician Assistant

## 2023-07-18 ENCOUNTER — Ambulatory Visit: Payer: Medicare Other | Admitting: Psychiatry

## 2023-07-30 ENCOUNTER — Ambulatory Visit: Payer: Medicare Other | Admitting: Psychiatry

## 2023-07-30 ENCOUNTER — Telehealth: Payer: Self-pay | Admitting: Physician Assistant

## 2023-07-30 DIAGNOSIS — F411 Generalized anxiety disorder: Secondary | ICD-10-CM | POA: Diagnosis not present

## 2023-07-30 NOTE — Progress Notes (Signed)
 [facility] Counselor/Therapist Progress Note  Patient ID: Ann Castillo, MRN: [id number],    Date: 07/30/2023  Time Spent: 50 minutes   Treatment Type: Individual Therapy  Reported Symptoms: anxiety, anger with other people, distracted thoughts    Mental Status Exam:  Appearance:   Casual and Neat     Behavior:  Appropriate, Sharing, and Motivated  Motor:  Normal  Speech/Language:   Clear and Coherent  Affect:  anxious  Mood:  anxious  Thought process:  goal directed  Thought content:    Some obsessive thoughts  Sensory/Perceptual disturbances:    WNL  Orientation:  oriented to person, place, time/date, situation, day of week, month of year, year, and stated date of July 30, 2023  Attention:  Fair  Concentration:  Fair and Poor  Memory:  WNL  Fund of knowledge:   Good  Insight:    Good and Fair  Judgment:   Good  Impulse Control:  Good   Risk Assessment: Danger to Self:  No Self-injurious Behavior: No Danger to Others: No Duty to Warn:no Physical Aggression / Violence:No  Access to Firearms a concern: No  Gang Involvement:No   Subjective:  Patient today in session reporting symptoms of anxiety, anger with other people, and distracted thoughts. Stated a lot of her frustration with current [place] president and finds it" hard not to be overwhelmingly angry and I feel hate about our current situation with current person in office." Talking almost non-stop in sessio today about her frustrations, anxiety, and not understanding the views of people who support president. Also upset today about adult son, wife, and child moving to [place] with son's job. Son and wife also expecting a baby in Sept of this year. Worries about oldest son who is "diabetic, under-employed and under-paid and weights about 400lbs. " Very absorbed with political issues going on right now." "Hard to see anything positive and I can't hope for anything better." Worked today on trying to help patient  better manage her anxiety and tried to discuss some strategies. Hard for patient to "turn off my feelings." States reading her books doesn't help as other "things don't help either." Vented freely in session which did seem to decrease her overall stress level and tearfulness. Denies any thoughts to harm self or others. Did show a slight bit of her sense of humor near session end today. Encouraged her being around other people, not avoiding family or others who can be a positive influence on her, stay on her meds as prescribed, take walks with her dog a few times daily, and limit her watching of news on TV or her phone. Has "spoken with her doctor about some tremors in her hands and sometimes other muscles."  Interventions: Cognitive Behavioral Therapy and Ego-Supportive  1.Reduce overall level, frequency, and intensity of her anxiety so that her motivation and follow through on goals is not  impaired. 2.Enhance her ability to handle effectively the full variety of life's anxieties. 3.Identify the major conflicts and present that form the basis for present anxiety.   Diagnosis:   ICD-10-CM   1. Generalized anxiety disorder  F41.1      Plan:  Patient today in session and working further on her anxiety, frustrations, and negative thought patterns.  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  This patient attends her appointments here and also receives her medication from a med provider in our practice as well.  Patient was angry and vented a lot regarding some political action and things that she has seen and heard on TV but states that she has now set limits with her watching of TV/online news.  Overall did better and communicating today and session was more effective.  Patient does need to continue working with goal-directed  behaviors to hopefully move in a more hopeful and healthier direction.  Review and progress/challenges noted with patient.  Next appointment within approximately 3 weeks.   Mathis Fare, LCSW

## 2023-07-30 NOTE — Telephone Encounter (Signed)
 Pt was in session with Ann Castillo. She would like to increase her gabapentin. Please talk to teresa and call pt back at [phone]

## 2023-07-31 NOTE — Telephone Encounter (Signed)
 Discussed with Traci.  Gabapentin 100 mg tablet is not on formulary; 100 mg capsule shows not reimbursible.

## 2023-07-31 NOTE — Telephone Encounter (Signed)
 On 1/29 patient was started on gabapentin 300 mg QPM for anxiety. She said she sleeps thru the night and wakes up rested, but the gabapentin does not address her anxiety during the day. She is asking if she can get a low dose, ? 100 mg, to help with her anxiety during the day.

## 2023-07-31 NOTE — Telephone Encounter (Signed)
 Yes, please send in gabapentin 100 mg, #60, 1 p.o. in the morning, and 1 p.o. in the afternoon as needed anxiety.  No refills.  Thank you

## 2023-08-05 ENCOUNTER — Other Ambulatory Visit: Payer: Self-pay | Admitting: Physician Assistant

## 2023-08-05 MED ORDER — GABAPENTIN 100 MG PO TABS: ORAL_TABLET | ORAL | 0 refills | Status: DC

## 2023-08-05 NOTE — Addendum Note (Signed)
 Addended by: Karin Lieu T on: 08/05/2023 09:05 AM   Modules accepted: Orders

## 2023-08-12 ENCOUNTER — Ambulatory Visit: Payer: Medicare Other | Admitting: Physician Assistant

## 2023-08-20 ENCOUNTER — Telehealth: Payer: Self-pay | Admitting: Physician Assistant

## 2023-08-20 ENCOUNTER — Ambulatory Visit (INDEPENDENT_AMBULATORY_CARE_PROVIDER_SITE_OTHER): Admitting: Psychiatry

## 2023-08-20 DIAGNOSIS — F411 Generalized anxiety disorder: Secondary | ICD-10-CM

## 2023-08-20 MED ORDER — GABAPENTIN 300 MG PO CAPS: 300.0000 mg | ORAL_CAPSULE | Freq: Every evening | ORAL | 1 refills | Status: DC

## 2023-08-20 NOTE — Telephone Encounter (Signed)
 Next visit is 08/20/23. Requesting refill on Gabapentin 300 mg called to:  [id number] - [place], [place] - [address] AT [facility] [address]   Phone: [phone]  Fax: [phone]

## 2023-08-20 NOTE — Telephone Encounter (Signed)
[facility] to [facility]

## 2023-08-20 NOTE — Progress Notes (Signed)
 [facility] Counselor/Therapist Progress Note  Patient ID: Ann Castillo, MRN: [id number],    Date: 08/20/2023  Time Spent: 55 minutes   Treatment Type: Individual Therapy  Reported Symptoms: anxiety, situational anger (decreased some), distracted thoughts (decreased)    Mental Status Exam:  Appearance:   Casual, Neat, and Well Groomed     Behavior:  Appropriate, Sharing, and Motivated  Motor:  Normal  Speech/Language:   Clear and Coherent  Affect:  anxious  Mood:  anxious  Thought process:  goal directed  Thought content:    WNL  Sensory/Perceptual disturbances:    WNL  Orientation:  oriented to person, place, time/date, situation, day of week, month of year, year, and stated date of August 20, 2023  Attention:  Good  Concentration:  Good  Memory:  WNL  Fund of knowledge:   Good  Insight:    Good  Judgment:   Good  Impulse Control:  Good   Risk Assessment: Danger to Self:  No Self-injurious Behavior: No Danger to Others: No Duty to Warn:no Physical Aggression / Violence:No  Access to Firearms a concern: No  Gang Involvement:No   Subjective:   Patient in for appointment today and reporting symptoms of anxiety, distracted thoughts, situational anger with other people but considers it to be more "mild".. Son and daughter-in-law and child have moved to [place] and patient plans to visit them for a month soon. Not talking non-stop today and appearing more calm. Does feel her Gabapentin is helping. Very "concerned about our country and we'll never get it back, it's gone and I'm grieving that loss already."  Processing her sadness that her son and family is moving away and needed time today to share and work through her sadness. Also brought up a very sensitive issue re: "possible identity theft" and was very upset talking and explaining situation. (Not all details included in this note due to patient privacy needs.) Discussed and shared in depth information which she  reported was helpful to her. States she does feel less angry but is not happy about it and has worked on letting it go more. Denies any thoughts of harm to self or others. Reports today feeling happier, more calm at this point.  So looking forward to visiting her son and his wife and grandchild.  She will be gone about a month and is scheduling an appointment for the week that she returns from that visit.  Does continue to be very absorbed with political issues but expresses no harm or desired to see anyone harmed.  Acknowledges that its "hard to turn off my feelings so I have to share them more openly and it does help."  She was calm upon leaving and also seem to be less anxious and much more focused on visiting family soon and having a good time with them for the next month.  Interventions: Cognitive Behavioral Therapy and Ego-Supportive  1.Reduce overall level, frequency, and intensity of her anxiety so that her motivation and follow through on goals is not  impaired. 2.Enhance her ability to handle effectively the full variety of life's anxieties. 3.Identify the major conflicts and present that form the basis for present anxiety.   Diagnosis:   ICD-10-CM   1. Generalized anxiety disorder  F41.1      Plan:   Patient in session today and encouraged to work further on her frustrations, anxiety, and negative thought patterns which she did spend some time with this and another issue as  noted above.  She was calm upon leaving and seems excited to be visiting family out of state especially a young grandson.  Encouraged to remain on her medications and she agreed with that stating that she feels like the gabapentin especially is helping her feel and be more stable currently.  Reports she is remaining on her medication as prescribed and really feels that it is helping her.  This represents some progress for patient and she is to continue working with goal-directed behaviors and taking her medications with a  goal of moving and a more hopeful, healthier, and more grounded direction.  Goal review and progress/challenges noted with patient.  Next appointment within approximately 3 weeks.   Mathis Fare, LCSW

## 2023-08-21 ENCOUNTER — Encounter: Payer: Self-pay | Admitting: Family Medicine

## 2023-08-21 ENCOUNTER — Ambulatory Visit (INDEPENDENT_AMBULATORY_CARE_PROVIDER_SITE_OTHER): Admitting: Family Medicine

## 2023-08-21 VITALS — BP 120/70 | HR 63 | Temp 98.0°F | Resp 16 | Ht 64.0 in | Wt 246.4 lb

## 2023-08-21 DIAGNOSIS — M546 Pain in thoracic spine: Secondary | ICD-10-CM | POA: Diagnosis not present

## 2023-08-21 MED ORDER — METHOCARBAMOL 500 MG PO TABS: 500.0000 mg | ORAL_TABLET | Freq: Two times a day (BID) | ORAL | 0 refills | Status: AC | PRN

## 2023-08-21 NOTE — Patient Instructions (Signed)
 A few things to remember from today's visit:  Acute right-sided thoracic back pain - Plan: methocarbamol (ROBAXIN) 500 MG tablet  Pain seems to be muscular. Avoid shallow breathing. Monitor for new symptoms, including rash. Stretching exercises may also help.  Do not use My Chart to request refills or for acute issues that need immediate attention. If you send a my chart message, it may take a few days to be addressed, specially if I am not in the office.  Please be sure medication list is accurate. If a new problem present, please set up appointment sooner than planned today.

## 2023-08-21 NOTE — Progress Notes (Signed)
 ACUTE VISIT Chief Complaint  Patient presents with   Back Pain    Lower kidney area, a week to 10 days   HPI: [name] is a 63 y.o. female, who is here today complaining of constant lower right thoracic pain.  She rates her pain last night an 8/10, her worst episode thus far.  In the past, these episodes self-resolved within an hours, but her pain is constant more recently.  Her pain tends to worsen when taking deep breaths.  Last night her pain radiated laterally and bilaterally.   Back Pain This is a new problem. The current episode started 1 to 4 weeks ago. The problem occurs constantly. The problem is unchanged. The pain is present in the thoracic spine. The quality of the pain is described as aching. The pain is moderate. The pain is Worse during the day. The symptoms are aggravated by bending and standing. Pertinent negatives include no abdominal pain, bladder incontinence, bowel incontinence, chest pain, dysuria, headaches, leg pain, numbness, pelvic pain, perianal numbness, tingling, weakness or weight loss. She has tried analgesics for the symptoms. The treatment provided mild relief.   She is taking Tylenol PM and Meloxicam daily.  Last night she used a topical cream and heating pad, which did help manage her pain.  Her pain has not interfered with her sleep at night.   Has a hx of gallstones but denies any hx of kidney stones.  Denies any fever, chills, chest pain wheezing, dyspnea, or any urinary symptoms.  No known trauma or recent unusual strenuous activity.   Lab Results  Component Value Date   NA 138 02/05/2023   CL 104 02/05/2023   K 3.7 02/05/2023   CO2 25 02/05/2023   BUN 19 02/05/2023   CREATININE 0.75 02/05/2023   GFR 86.21 02/05/2023   CALCIUM 9.8 02/05/2023   ALBUMIN 4.1 02/05/2023   GLUCOSE 96 02/05/2023   Review of Systems  Constitutional:  Positive for activity change. Negative for appetite change and weight loss.  HENT:  Negative for  mouth sores and sore throat.   Respiratory:  Negative for cough and wheezing.   Cardiovascular:  Negative for chest pain.  Gastrointestinal:  Negative for abdominal pain, bowel incontinence, nausea and vomiting.  Genitourinary:  Negative for bladder incontinence, decreased urine volume, dysuria, hematuria and pelvic pain.  Musculoskeletal:  Positive for back pain.  Skin:  Negative for rash.  Neurological:  Negative for tingling, weakness, numbness and headaches.  Psychiatric/Behavioral:  Negative for confusion.   See other pertinent positives and negatives in HPI.  Current Outpatient Medications on File Prior to Visit  Medication Sig Dispense Refill   Bempedoic Acid (NEXLETOL) 180 MG TABS Take 1 tablet (180 mg total) by mouth daily. 90 tablet 3   Cholecalciferol (VITAMIN D) 125 MCG (5000 UT) CAPS Take 1 capsule by mouth daily.     Coenzyme Q10 (COQ10) 100 MG CAPS Take 1 capsule by mouth daily.     Cyanocobalamin (VITAMIN B-12) 2500 MCG SUBL Place 1 tablet under the tongue daily.     escitalopram (LEXAPRO) 20 MG tablet TAKE 2 TABLETS BY MOUTH DAILY 180 tablet 3   Evolocumab (REPATHA SURECLICK) 140 MG/ML SOAJ INJECT 1 PEN INTO THE SKIN EVERY 14 DAYS 6 mL 3   gabapentin (NEURONTIN) 100 MG capsule TAKE 1 CAPSULE BY MOUTH EVERY MORNING AND TAKE 1 CAPSULE IN THE AFTERNOON AS NEEDED FOR ANXIETY 60 capsule 0   gabapentin (NEURONTIN) 300 MG capsule Take 1 capsule (  300 mg total) by mouth every evening. 30 capsule 1   lisinopril-hydrochlorothiazide (ZESTORETIC) 10-12.5 MG tablet Take 1 tablet by mouth daily.     magnesium gluconate (MAGONATE) 500 MG tablet Take 500 mg by mouth daily.     meloxicam (MOBIC) 15 MG tablet Take 15 mg by mouth daily as needed for pain.      Milk Thistle 150 MG CAPS Take 150 mg by mouth daily.     Nutritional Supplements (JOINT FORMULA PO) Take 1 tablet by mouth daily.     SEMAGLUTIDE PO Take 48 mg by mouth once a week.     UNABLE TO FIND 10 mg as needed. Med Name: CBD oil      UNABLE TO FIND Med Name: Lion's Mane 1000 mg daily.     UNABLE TO FIND Med Name: [place]ount[place] Beets 500 mg daily.     No current facility-administered medications on file prior to visit.    Past Medical History:  Diagnosis Date   Anemia, unspecified    Anxiety    Carotid stenosis, right    1-39% by doppler 10/2019   Cavus deformity of foot, acquired    Depression    Dysthymic disorder    Edema    bilateral   Esophageal reflux    Essential hypertension, benign    Hx pulmonary embolism 2006   was on estrogen    Insomnia, unspecified    Major depressive disorder, single episode, unspecified    Migraine, unspecified, without mention of intractable migraine without mention of status migrainosus    Myalgia and myositis, unspecified    Obesity, unspecified    Other and unspecified hyperlipidemia    Polycystic ovaries    Unspecified gastritis and gastroduodenitis without mention of hemorrhage    Allergies  Allergen Reactions   Celecoxib Anaphylaxis    REACTION: ? anaphylactic reaction 10/11 - throat closing, hand itching 1 1/2 hrs after dose but had taken this previously without difficulty. REACTION: ? anaphylactic reaction 10/11 - throat closing, hand itching 1 1/2 hrs after dose but had taken this previously without difficulty.   Codeine Other (See Comments)    REACTION: questionable: mental status changes   Estrogenic Substance Other (See Comments)    Blood clots   Hydromorphone Hcl Other (See Comments)    Pain  Increases.  blindness   Latex Rash   Pravastatin Sodium Other (See Comments)    REACTION: upper extremity muscle aches - ? anaphylactic reaction 3 days later 03/07/08   Rosuvastatin Other (See Comments)    REACTION: myalgia REACTION: myalgia   Codeine Phosphate     REACTION: questionable: mental status changes   Dilaudid [Hydromorphone Hcl]     Pain  Increases.  blindness   Estrogen    Pravastatin Sodium     REACTION: upper extremity muscle aches - ?  anaphylactic reaction 3 days later 03/07/08   Zetia [Ezetimibe]     Stomach cramping, diarrhea    Social History   Socioeconomic History   Marital status: Married    Spouse name: Not on file   Number of children: 3   Years of education: College   Highest education level: Not on file  Occupational History   Occupation: [employer]    Employer: [employer]  Tobacco Use   Smoking status: Never   Smokeless tobacco: Never  Vaping Use   Vaping status: Never Used  Substance and Sexual Activity   Alcohol use: Yes    Alcohol/week: 0.0 - 1.0 standard drinks  of alcohol   Drug use: No   Sexual activity: Not Currently    Comment: 1st intercourse- 20, partners- 1, married- 38 yr s  Other Topics Concern   Not on file  Social History Narrative   Married, works as a IT consultant.  She has significant stress from daughter, who is mentally ill and lives at home (age 45).     Caffeine: very rarely    Social Drivers of Corporate investment banker Strain: Not at Risk (09/10/2022)   Received from [place], [employer]    Financial Resource Strain: 1  Food Insecurity: Not at Risk (09/10/2022)   Received from [place], [place]   Food Insecurity    Food: 1  Transportation Needs: Not at Risk (09/10/2022)   Received from [place], [employer] Needs    Transportation: 1  Physical Activity: Not at Risk (09/10/2022)   Received from [place], [place]   Physical Activity    Physical Activity: 1  Stress: Not at Risk (09/10/2022)   Received from [place], [place]   Stress    Stress: 1  Social Connections: Not at Risk (09/10/2022)   Received from [facility]    Connectedness: 1   Vitals:   08/21/23 1301  BP: 120/70  Pulse: 63  Resp: 16  Temp: 98 F (36.7 C)  SpO2: 97%   Body mass index is 42.29 kg/m.  Physical Exam Vitals and nursing note reviewed.  Constitutional:      General: She is not in acute distress.    Appearance: She is well-developed.  HENT:      Head: Normocephalic and atraumatic.  Eyes:     Conjunctiva/sclera: Conjunctivae normal.  Cardiovascular:     Rate and Rhythm: Normal rate and regular rhythm.     Heart sounds: No murmur heard. Pulmonary:     Effort: Pulmonary effort is normal. No respiratory distress.     Breath sounds: Normal breath sounds.  Abdominal:     Palpations: Abdomen is soft. There is no mass.     Tenderness: There is no abdominal tenderness.  Musculoskeletal:     Thoracic back: Tenderness present.     Lumbar back: No tenderness or bony tenderness.       Back:  Skin:    General: Skin is warm.     Findings: No erythema.  Neurological:     General: No focal deficit present.     Mental Status: She is alert and oriented to person, place, and time.     Gait: Gait normal.  Psychiatric:        Mood and Affect: Mood and affect normal.   ASSESSMENT AND PLAN: Ms. Ann Castillo was seen today for constant lower right thoracic pain.  Acute right-sided thoracic back pain -     Methocarbamol; Take 1 tablet (500 mg total) by mouth 2 (two) times daily as needed for up to 14 days for muscle spasms.  Dispense: 28 tablet; Refill: 0  Discussed possible etiologies, history and examination suggest musculoskeletal causes. She is concerned about "kidney pain", on for a UA to check for microscopic hematuria, she prefers to hold on this for now. Monitor for new symptoms. Tylenol seems to help, so continue 500 mg 3-4 times per day and meloxicam 15 mg daily as needed. Muscle relaxant may help, she agrees with trial of methocarbamol 500 mg twice daily as needed, mainly at night if driving during the day or engaging in activities that  can increase risk for injuries. Continue daily activities as tolerated. I do not think imaging is needed at this time. Follow-up with PCP as needed.  I spent a total of 30 minutes in both face to face and non face to face activities for this visit on the date of this encounter. During  this time history was obtained and documented, examination was performed, prior labs reviewed, and assessment/plan discussed.  Return if symptoms worsen or fail to improve.  I, Isabelle Course, acting as a scribe for Irwin Toran [place], MD., have documented all relevant documentation on the behalf of Cabela Pacifico [place], MD, as directed by  Aseel Truxillo [place], MD while in the presence of Hady Niemczyk [place], MD.  I, Nishaan Stanke [place], MD, have reviewed all documentation for this visit. The documentation on 08/21/23 for the exam, diagnosis, procedures, and orders are all accurate and complete.  Shawna Kiener G. [place], MD  [facility]. [place] office.

## 2023-08-27 DIAGNOSIS — M545 Low back pain, unspecified: Secondary | ICD-10-CM | POA: Diagnosis not present

## 2023-08-27 DIAGNOSIS — S299XXA Unspecified injury of thorax, initial encounter: Secondary | ICD-10-CM | POA: Diagnosis not present

## 2023-08-27 DIAGNOSIS — S0101XA Laceration without foreign body of scalp, initial encounter: Secondary | ICD-10-CM | POA: Diagnosis not present

## 2023-08-27 DIAGNOSIS — M542 Cervicalgia: Secondary | ICD-10-CM | POA: Diagnosis not present

## 2023-08-27 DIAGNOSIS — M25572 Pain in left ankle and joints of left foot: Secondary | ICD-10-CM | POA: Diagnosis not present

## 2023-08-27 DIAGNOSIS — R0789 Other chest pain: Secondary | ICD-10-CM | POA: Diagnosis not present

## 2023-08-27 DIAGNOSIS — M25552 Pain in left hip: Secondary | ICD-10-CM | POA: Diagnosis not present

## 2023-08-27 DIAGNOSIS — M549 Dorsalgia, unspecified: Secondary | ICD-10-CM | POA: Diagnosis not present

## 2023-08-27 DIAGNOSIS — M546 Pain in thoracic spine: Secondary | ICD-10-CM | POA: Diagnosis not present

## 2023-08-27 DIAGNOSIS — M7989 Other specified soft tissue disorders: Secondary | ICD-10-CM | POA: Diagnosis not present

## 2023-08-27 DIAGNOSIS — S4992XA Unspecified injury of left shoulder and upper arm, initial encounter: Secondary | ICD-10-CM | POA: Diagnosis not present

## 2023-08-27 DIAGNOSIS — M25512 Pain in left shoulder: Secondary | ICD-10-CM | POA: Diagnosis not present

## 2023-08-27 DIAGNOSIS — S79912A Unspecified injury of left hip, initial encounter: Secondary | ICD-10-CM | POA: Diagnosis not present

## 2023-08-27 DIAGNOSIS — W108XXA Fall (on) (from) other stairs and steps, initial encounter: Secondary | ICD-10-CM | POA: Diagnosis not present

## 2023-08-27 DIAGNOSIS — R079 Chest pain, unspecified: Secondary | ICD-10-CM | POA: Diagnosis not present

## 2023-08-28 ENCOUNTER — Encounter: Payer: Self-pay | Admitting: Internal Medicine

## 2023-09-06 DIAGNOSIS — S0101XD Laceration without foreign body of scalp, subsequent encounter: Secondary | ICD-10-CM | POA: Diagnosis not present

## 2023-09-06 DIAGNOSIS — X58XXXD Exposure to other specified factors, subsequent encounter: Secondary | ICD-10-CM | POA: Diagnosis not present

## 2023-09-07 ENCOUNTER — Other Ambulatory Visit: Payer: Self-pay | Admitting: Cardiology

## 2023-09-07 DIAGNOSIS — E7801 Familial hypercholesterolemia: Secondary | ICD-10-CM

## 2023-09-07 DIAGNOSIS — I6521 Occlusion and stenosis of right carotid artery: Secondary | ICD-10-CM

## 2023-09-07 DIAGNOSIS — E7800 Pure hypercholesterolemia, unspecified: Secondary | ICD-10-CM

## 2023-09-30 LAB — TSH: TSH: 0.11 — AB (ref 0.41–5.90)

## 2023-09-30 LAB — BASIC METABOLIC PANEL WITH GFR
BUN: 20 (ref 4–21)
CO2: 30 — AB (ref 13–22)
Chloride: 103 (ref 99–108)
Creatinine: 0.8 (ref 0.5–1.1)
Glucose: 99
Potassium: 3.9 meq/L (ref 3.5–5.1)
Sodium: 141 (ref 137–147)

## 2023-09-30 LAB — HEPATIC FUNCTION PANEL
ALT: 17 U/L (ref 7–35)
AST: 20 (ref 13–35)
Alkaline Phosphatase: 54 (ref 25–125)
Bilirubin, Total: 0.7

## 2023-09-30 LAB — CBC AND DIFFERENTIAL
HCT: 38 (ref 36–46)
Hemoglobin: 13 (ref 12.0–16.0)
Platelets: 299 K/uL (ref 150–400)
WBC: 5.7

## 2023-09-30 LAB — COMPREHENSIVE METABOLIC PANEL WITH GFR: Albumin: 3.9 (ref 3.5–5.0)

## 2023-09-30 LAB — VITAMIN D 25 HYDROXY (VIT D DEFICIENCY, FRACTURES): Vit D, 25-Hydroxy: 59.4

## 2023-09-30 LAB — CBC: RBC: 4.25 (ref 3.87–5.11)

## 2023-09-30 LAB — HEMOGLOBIN A1C: Hemoglobin A1C: 5.4

## 2023-10-01 ENCOUNTER — Ambulatory Visit: Admitting: Physician Assistant

## 2023-10-04 ENCOUNTER — Other Ambulatory Visit: Payer: Self-pay | Admitting: Physician Assistant

## 2023-10-07 DIAGNOSIS — M545 Low back pain, unspecified: Secondary | ICD-10-CM | POA: Diagnosis not present

## 2023-10-14 DIAGNOSIS — S39012D Strain of muscle, fascia and tendon of lower back, subsequent encounter: Secondary | ICD-10-CM | POA: Diagnosis not present

## 2023-10-30 ENCOUNTER — Other Ambulatory Visit: Payer: Self-pay | Admitting: Physician Assistant

## 2023-11-07 ENCOUNTER — Encounter: Payer: Self-pay | Admitting: Physician Assistant

## 2023-11-07 ENCOUNTER — Ambulatory Visit: Admitting: Physician Assistant

## 2023-11-07 DIAGNOSIS — G4700 Insomnia, unspecified: Secondary | ICD-10-CM | POA: Diagnosis not present

## 2023-11-07 DIAGNOSIS — F4323 Adjustment disorder with mixed anxiety and depressed mood: Secondary | ICD-10-CM

## 2023-11-07 MED ORDER — GABAPENTIN 300 MG PO CAPS: 300.0000 mg | ORAL_CAPSULE | Freq: Every evening | ORAL | 3 refills | Status: DC

## 2023-11-07 MED ORDER — GABAPENTIN 100 MG PO CAPS: ORAL_CAPSULE | ORAL | 3 refills | Status: DC

## 2023-11-07 NOTE — Progress Notes (Signed)
 [facility]  Patient ID: Ann Castillo,  MRN: [id number]  PCP: [place], Betty G, MD  Date of Evaluation: 11/07/2023 Time spent:25 minutes  Chief Complaint:  Chief Complaint   Depression; Anxiety; Follow-up    HISTORY/CURRENT STATUS: HPI Routine Med check  Is doing well for the most part.  Has lost 50 # since being on Ozempic. Limits her CNN time to 15 mins/day which has helped with frustrations of current events. 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 Visited fm in [place] a few months ago and missed a step, which led to a fall down approximately 10 steps.. No fx but multiple bruises, sore muscles, and joints. Posterior scalp wound required sutures. Had another fall since then, skinned left knee. Jokes and says she's trying to learn to fly.   She feels like her medications are working well. No anhedonia.  No extreme sadness, tearfulness, or feelings of hopelessness.  Sleeps well most of the time. ADLs nl, hygiene nl.  No change in memory, focus.   Denies suicidal or homicidal thoughts.  Patient denies increased energy with decreased need for sleep, increased talkativeness, racing thoughts, impulsivity or risky behaviors, increased spending, increased libido, grandiosity, increased irritability or anger, paranoia, or hallucinations.  Denies dizziness, syncope, seizures, numbness, tingling, tremor, tics, slurred speech, confusion. Meds aren't the cause of falls.  Denies muscle or joint pain, stiffness, or dystonia.   Individual Medical History/ Review of Systems: Changes? :Yes    see HPI and notes on chart  Past medications for mental health diagnoses include: Lexapro , trazodone , loxapine, Ambien, Klonopin, melatonin   Allergies: Celecoxib, Codeine, Estrogenic substance, Hydromorphone hcl, Latex, Pravastatin sodium, Rosuvastatin, Codeine phosphate, Dilaudid [hydromorphone hcl], Estrogen, Pravastatin sodium, and Zetia  [ezetimibe ]  Current Medications:  Current  Outpatient Medications:    Bempedoic Acid  (NEXLETOL ) 180 MG TABS, Take 1 tablet (180 mg total) by mouth daily., Disp: 90 tablet, Rfl: 3   Cholecalciferol (VITAMIN D ) 125 MCG (5000 UT) CAPS, Take 1 capsule by mouth daily., Disp: , Rfl:    Coenzyme Q10 (COQ10) 100 MG CAPS, Take 1 capsule by mouth daily., Disp: , Rfl:    Cyanocobalamin (VITAMIN B-12) 2500 MCG SUBL, Place 1 tablet under the tongue daily., Disp: , Rfl:    escitalopram  (LEXAPRO ) 20 MG tablet, TAKE 2 TABLETS BY MOUTH DAILY, Disp: 180 tablet, Rfl: 3   Evolocumab  (REPATHA  SURECLICK) 140 MG/ML SOAJ, INJECT 1 PEN INTO THE SKIN EVERY 14 DAYS, Disp: 6 mL, Rfl: 0   levothyroxine (SYNTHROID) 50 MCG tablet, Take 50 mcg by mouth daily before breakfast., Disp: , Rfl:    lisinopril -hydrochlorothiazide  (ZESTORETIC) 10-12.5 MG tablet, Take 1 tablet by mouth daily., Disp: , Rfl:    magnesium gluconate (MAGONATE) 500 MG tablet, Take 500 mg by mouth daily., Disp: , Rfl:    meloxicam (MOBIC) 15 MG tablet, Take 15 mg by mouth daily as needed for pain. , Disp: , Rfl:    Milk Thistle 150 MG CAPS, Take 150 mg by mouth daily., Disp: , Rfl:    SEMAGLUTIDE PO, Take 48 mg by mouth once a week., Disp: , Rfl:    gabapentin  (NEURONTIN ) 100 MG capsule, TAKE 1 CAPSULE BY MOUTH EVERY MORNING AND 1 CAPSULE IN THE AFTERNOON AS NEEDED FOR ANXIETY, Disp: 180 capsule, Rfl: 3   gabapentin  (NEURONTIN ) 300 MG capsule, Take 1 capsule (300 mg total) by mouth every evening., Disp: 90 capsule, Rfl: 3   Nutritional Supplements (JOINT FORMULA PO), Take 1 tablet by mouth daily. (Patient not taking:  Reported on 11/07/2023), Disp: , Rfl:    UNABLE TO FIND, 10 mg as needed. Med Name: CBD oil, Disp: , Rfl:    UNABLE TO FIND, Med Name: Lion's Mane 1000 mg daily., Disp: , Rfl:    UNABLE TO FIND, Med Name: Bount[place] Beets 500 mg daily., Disp: , Rfl:  Medication Side Effects: none  Family Medical/ Social History: Changes?  No  MENTAL HEALTH EXAM:  Last menstrual period 12/26/2012.There  is no height or weight on file to calculate BMI.  General Appearance: Casual, Well Groomed, and Obese  Eye Contact:  Good  Speech:  Clear and Coherent and Normal Rate  Volume:  Normal  Mood:  Euthymic  Affect:  Congruent  Thought Process:  Goal Directed and Descriptions of Associations: Circumstantial  Orientation:  Full (Time, Place, and Person)  Thought Content: Logical   Suicidal Thoughts:  No  Homicidal Thoughts:  No  Memory:  WNL  Judgement:  Good  Insight:  Good  Psychomotor Activity:  Normal  Concentration:  Concentration: Good and Attention Span: Good  Recall:  Good  Fund of Knowledge: Good  Language: Good  Assets:  Desire for Improvement Financial Resources/Insurance Housing Transportation Vocational/Educational  ADL's:  Intact  Cognition: WNL  Prognosis:  Good   Labs 08/27/2023 CBC with diff and CMP reviewed  DIAGNOSES:    ICD-10-CM   1. Situational mixed anxiety and depressive disorder  F43.23     2. Insomnia, unspecified type  G47.00       Receiving Psychotherapy: Yes  Reid Capuchin, LCSW in the past  RECOMMENDATIONS:  PDMP reviewed.  Gabapentin  filled 10/30/2023. I provided 25 minutes of face to face time during this encounter, including time spent before and after the visit in records review, medical decision making, counseling pertinent to today's visit, and charting.   She is doing well on current medications so no changes need to be made.  Continue Lexapro  20 mg, 2 po daily. Continue gabapentin  300 mg, 1 p.o. nightly and 100 mg twice during the day as needed. Continue vitamins and supplements from her integrative doctor. Continue therapy with Reid Capuchin, LCSW as needed.   Return in 6 months.  Marvia Slocumb, PA-C

## 2023-11-24 ENCOUNTER — Other Ambulatory Visit: Payer: Self-pay | Admitting: Physician Assistant

## 2023-11-30 ENCOUNTER — Other Ambulatory Visit: Payer: Self-pay | Admitting: Physician Assistant

## 2023-12-01 ENCOUNTER — Other Ambulatory Visit: Payer: Self-pay | Admitting: Cardiology

## 2023-12-01 DIAGNOSIS — I6521 Occlusion and stenosis of right carotid artery: Secondary | ICD-10-CM

## 2023-12-01 DIAGNOSIS — E7801 Familial hypercholesterolemia: Secondary | ICD-10-CM

## 2023-12-01 DIAGNOSIS — E7800 Pure hypercholesterolemia, unspecified: Secondary | ICD-10-CM

## 2023-12-09 ENCOUNTER — Ambulatory Visit
Admission: RE | Admit: 2023-12-09 | Discharge: 2023-12-09 | Disposition: A | Discharge status: Home / Self Care | Source: Ambulatory Visit | Attending: Family Medicine | Admitting: Family Medicine

## 2023-12-09 ENCOUNTER — Ambulatory Visit (INDEPENDENT_AMBULATORY_CARE_PROVIDER_SITE_OTHER): Admitting: Family Medicine

## 2023-12-09 ENCOUNTER — Ambulatory Visit: Payer: Self-pay | Admitting: Family Medicine

## 2023-12-09 ENCOUNTER — Encounter: Payer: Self-pay | Admitting: Family Medicine

## 2023-12-09 VITALS — BP 124/80 | HR 64 | Temp 98.1°F | Resp 16 | Ht 64.0 in | Wt 245.0 lb

## 2023-12-09 DIAGNOSIS — T466X5A Adverse effect of antihyperlipidemic and antiarteriosclerotic drugs, initial encounter: Secondary | ICD-10-CM

## 2023-12-09 DIAGNOSIS — G720 Drug-induced myopathy: Secondary | ICD-10-CM | POA: Diagnosis not present

## 2023-12-09 DIAGNOSIS — E049 Nontoxic goiter, unspecified: Secondary | ICD-10-CM

## 2023-12-09 DIAGNOSIS — R296 Repeated falls: Secondary | ICD-10-CM | POA: Diagnosis not present

## 2023-12-09 DIAGNOSIS — Z780 Asymptomatic menopausal state: Secondary | ICD-10-CM

## 2023-12-09 DIAGNOSIS — E041 Nontoxic single thyroid nodule: Secondary | ICD-10-CM | POA: Diagnosis not present

## 2023-12-09 DIAGNOSIS — D351 Benign neoplasm of parathyroid gland: Secondary | ICD-10-CM

## 2023-12-09 DIAGNOSIS — E039 Hypothyroidism, unspecified: Secondary | ICD-10-CM | POA: Insufficient documentation

## 2023-12-09 LAB — TSH: TSH: 0.44 u[IU]/mL (ref 0.35–5.50)

## 2023-12-09 NOTE — Patient Instructions (Addendum)
 A few things to remember from today's visit:  Enlarged thyroid  gland - Plan: US  THYROID , TSH  Asymptomatic postmenopausal estrogen deficiency - Plan: DG Bone Density  Hypothyroidism (acquired)  Frequent falls  Tai shi a few times per week recommended.  If you need refills for medications you take chronically, please call your pharmacy. Do not use My Chart to request refills or for acute issues that need immediate attention. If you send a my chart message, it may take a few days to be addressed, specially if I am not in the office.  Please be sure medication list is accurate. If a new problem present, please set up appointment sooner than planned today.

## 2023-12-09 NOTE — Progress Notes (Signed)
 Discussed the use of AI scribe software for clinical note transcription with the patient, who gave verbal consent to proceed.  History of Present Illness Ann Castillo is a 63 year old female with hx of hypothyroidism,prediabetes,HTN, chronic pain, and Lyme disease who presents for ED follow-up after a fall and concerns about imaging findings.  She experienced a fall on August 27, 2023, fell down stairs in her son's new house,  resulting in a head laceration and loss of consciousness.   Imaging studies including CT scans of the head, neck, thoracic, lumbar, and chest were performed. Staples were removed subsequently, 09/06/23. No residual symptoms.  During the imaging, incidental findings included gallbladder stones and an enlarged left-sided thyroid . No gastrointestinal symptoms such as nausea or abdominal pain are present.  She has a history of frequent falls, having fallen over 100 times, and attributes some falls to orthostatic hypotension. 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  August 27, 2023 head CT without contrast: 1. No acute intracranial process.  2. Posterior occipital scalp laceration without subjacent calvarial fracture.  3. Mild chronic microvascular ischemic changes.   Chest CT with contrast: 1. No CT evidence of visceral organ injury in the chest, abdomen, or pelvis.  2. Posttraumatic stranding left upper posterior lateral chest wall, and right gluteal regions. No organized hematoma or active bleeding.  3. Cholelithiasis with a very mild nonspecific wall thickening. If concern for chronic biliary pathology consider ultrasound or HIDA scan.  4. Hepatic steatosis.   Cervical, thoracic, lumbar CT: 1. No acute fracture or traumatic subluxation in the cervical, thoracic, or lumbar spine.  2. Multilevel degenerative disc and facet changes as detailed above.   3. 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   Left hip/pelvis x-ray: 1. No acute fracture or malalignment.  2. Mild left hip osteoarthritis.  3. No pubic symphysis or SI joint diastases   Left shoulder x-ray: 1. No acute fracture or traumatic malalignment.  2. Osseous demineralization with arthritic changes.   Hypothyroidism: Regarding her thyroid , she was seen by an integrative health specialist on Sep 30, 2023, she states that thyroid  medication was increased, from 50 mcg to 60 mcg based on lab results. No new symptoms such as palpitations, worsening tremors, or diarrhea.  TSH 0.11 on 09/30/23.  She has some concerns about Gabapentin . She is on Gabapentin  300 mg at night and 200 mg in the morning, prescribed for seizures and symptoms related to Lyme disease. She is concerned about a recent study suggesting a potential link between gabapentin  and cognitive decline, especially given her family history of Alzheimer's and dementia.  HLD: She has not tolerated statins in the past.  Review of Systems  Constitutional:  Negative for activity change, appetite change and fever.  HENT:  Negative for mouth sores and sore throat.   Respiratory:  Negative for cough, shortness of breath and wheezing.   Cardiovascular:  Negative for chest pain.  Gastrointestinal:  Negative for abdominal pain, nausea and vomiting.  Endocrine: Negative for cold intolerance and heat intolerance.  Genitourinary:  Negative for decreased urine volume, dysuria and hematuria.  Skin:  Negative for rash.  Neurological:  Negative for syncope and headaches.  See other pertinent positives and negatives in HPI.  Current Outpatient Medications on File Prior to Visit  Medication Sig Dispense Refill   Bempedoic Acid  (NEXLETOL )  180 MG TABS Take 1 tablet (180 mg total) by mouth daily. 90 tablet 3   Cholecalciferol (VITAMIN D ) 125 MCG (5000 UT) CAPS Take 1 capsule by  mouth daily.     Coenzyme Q10 (COQ10) 100 MG CAPS Take 1 capsule by mouth daily.     Cyanocobalamin (VITAMIN B-12) 2500 MCG SUBL Place 1 tablet under the tongue daily.     escitalopram  (LEXAPRO ) 20 MG tablet TAKE 2 TABLETS BY MOUTH DAILY 180 tablet 3   Evolocumab  (REPATHA  SURECLICK) 140 MG/ML SOAJ INJECT 1 PEN INTO THE SKIN EVERY 14 DAYS 6 mL 3   gabapentin  (NEURONTIN ) 100 MG capsule TAKE 1 CAPSULE BY MOUTH EVERY MORNING AND 1 CAPSULE IN THE AFTERNOON AS NEEDED FOR ANXIETY 60 capsule 3   gabapentin  (NEURONTIN ) 300 MG capsule TAKE 1 CAPSULE BY MOUTH EVERY EVENING 90 capsule 0   lisinopril -hydrochlorothiazide  (ZESTORETIC) 10-12.5 MG tablet Take 1 tablet by mouth daily.     magnesium gluconate (MAGONATE) 500 MG tablet Take 500 mg by mouth daily.     meloxicam (MOBIC) 15 MG tablet Take 15 mg by mouth daily as needed for pain.      Milk Thistle 150 MG CAPS Take 150 mg by mouth daily.     Nutritional Supplements (JOINT FORMULA PO) Take 1 tablet by mouth daily.     SEMAGLUTIDE PO Take 48 mg by mouth once a week.     thyroid  (ARMOUR) 60 MG tablet Take 60 mg by mouth daily before breakfast.     UNABLE TO FIND 10 mg as needed. Med Name: CBD oil     UNABLE TO FIND Med Name: Lion's Mane 1000 mg daily.     UNABLE TO FIND Med Name: Bountiful Beets 500 mg daily.     No current facility-administered medications on file prior to visit.    Past Medical History:  Diagnosis Date   Anemia, unspecified    Anxiety    Carotid stenosis, right    1-39% by doppler 10/2019   Cavus deformity of foot, acquired    Depression    Dysthymic disorder    Edema    bilateral   Esophageal reflux    Essential hypertension, benign    Hx pulmonary embolism 2006   was on estrogen    Insomnia, unspecified    Major depressive disorder, single episode, unspecified    Migraine, unspecified, without mention of intractable migraine without mention of status migrainosus    Myalgia and myositis, unspecified    Obesity,  unspecified    Other and unspecified hyperlipidemia    Polycystic ovaries    Unspecified gastritis and gastroduodenitis without mention of hemorrhage    Allergies  Allergen Reactions   Celecoxib Anaphylaxis    REACTION: ? anaphylactic reaction 10/11 - throat closing, hand itching 1 1/2 hrs after dose but had taken this previously without difficulty. REACTION: ? anaphylactic reaction 10/11 - throat closing, hand itching 1 1/2 hrs after dose but had taken this previously without difficulty.   Codeine Other (See Comments)    REACTION: questionable: mental status changes   Estrogenic Substance Other (See Comments)    Blood clots   Hydromorphone Hcl Other (See Comments)    Pain  Increases.  blindness   Latex Rash   Pravastatin Sodium Other (See Comments)    REACTION: upper extremity muscle aches - ? anaphylactic reaction 3 days later 03/07/08   Rosuvastatin Other (See Comments)    REACTION: myalgia REACTION: myalgia   Codeine Phosphate  REACTION: questionable: mental status changes   Dilaudid [Hydromorphone Hcl]     Pain  Increases.  blindness   Estrogen    Pravastatin Sodium     REACTION: upper extremity muscle aches - ? anaphylactic reaction 3 days later 03/07/08   Zetia  [Ezetimibe ]     Stomach cramping, diarrhea    Social History   Socioeconomic History   Marital status: Married    Spouse name: Not on file   Number of children: 3   Years of education: College   Highest education level: Not on file  Occupational History   Occupation: [employer]    Employer: [employer]  Tobacco Use   Smoking status: Never   Smokeless tobacco: Never  Vaping Use   Vaping status: Never Used  Substance and Sexual Activity   Alcohol use: Yes    Alcohol/week: 0.0 - 1.0 standard drinks of alcohol   Drug use: No   Sexual activity: Not Currently    Comment: 1st intercourse- 20, partners- 1, married- 38 yr s  Other Topics Concern   Not on file  Social History Narrative    Married, works as a IT consultant.  She has significant stress from daughter, who is mentally ill and lives at home (age 73).     Caffeine: very rarely    Social Drivers of Corporate investment banker Strain: Not at Risk (09/10/2022)   Received from [facility]    Financial Resource Strain: 1  Food Insecurity: Not at Risk (09/10/2022)   Received from [facility] Insecurity    Food: 1  Transportation Needs: Not at Risk (09/10/2022)   Received from [facility] Needs    Transportation: 1  Physical Activity: Not at Risk (09/10/2022)   Received from [facility]   Physical Activity    Physical Activity: 1  Stress: Not at Risk (09/10/2022)   Received from [facility]   Stress    Stress: 1  Social Connections: Not at Risk (09/10/2022)   Received from [facility]   Social Connections    Connectedness: 1   Vitals:   12/09/23 0844  BP: 124/80  Pulse: 64  Resp: 16  Temp: 98.1 F (36.7 C)  SpO2: 98%   Body mass index is 42.05 kg/m.  Physical Exam Vitals and nursing note reviewed.  Constitutional:      General: She is not in acute distress.    Appearance: She is well-developed.  HENT:     Head: Normocephalic and atraumatic.     Mouth/Throat:     Mouth: Mucous membranes are moist.     Pharynx: Oropharynx is clear.  Eyes:     Conjunctiva/sclera: Conjunctivae normal.  Neck:     Thyroid : No thyroid  mass or thyromegaly.  Cardiovascular:     Rate and Rhythm: Normal rate and regular rhythm.     Heart sounds: No murmur heard. Pulmonary:     Effort: Pulmonary effort is normal. No respiratory distress.     Breath sounds: Normal breath sounds.  Abdominal:     Palpations: Abdomen is soft. There is no mass.     Tenderness: There is no abdominal tenderness.  Lymphadenopathy:     Cervical: No cervical adenopathy.  Skin:    General: Skin is warm.     Findings: No erythema or rash.  Neurological:     General: No focal deficit present.     Mental Status: She is alert and  oriented to person, place, and time.  Cranial Nerves: No cranial nerve deficit.     Gait: Gait normal.  Psychiatric:        Mood and Affect: Mood and affect normal.   ASSESSMENT AND PLAN:  Ms. Sharise was seen today for results.  Diagnoses and all orders for this visit:  Orders Placed This Encounter  Procedures   DG Bone Density   US  THYROID    TSH   Lab Results  Component Value Date   TSH 0.44 12/09/2023   Enlarged thyroid  gland Noted on chest CT scan, left sided. Further recommendations according to thyroid  US  results.  -     TSH; Future -     US  THYROID ; Future  Hypothyroidism (acquired) Assessment & Plan: According to patient, after TSH done in 09/2023 her armour thyroid  dose was increased from 50 mcg to 60 mcg daily. TSH will be checked today and further recommendation will be given accordingly.  Frequent falls Assessment & Plan: Some os her chronic co-morbilities can certainly increase risk for falls as well as some medications. Fall precautions discussed. She has done PT in the past Recommend Tai chi few times per week.  Asymptomatic postmenopausal estrogen deficiency Osseous demineralization with arthritic changes seen on shoulder X ray.   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  In regard to Gabapentin  , we discussed some side effects. Recent study she mentioned was an observational analysis.  In her care it seems like benefit is greater that risk. Recommend continuing medication and discussing her concerns with prescriber.  I spent a total of 43 minutes in both face to face and non face to face activities for this visit on the date of this encounter. During this time history was obtained and documented, examination was performed, prior labs/imaging reviewed, and assessment/plan discussed.   Return if symptoms worsen or fail to improve, for keep next  appointment.  Leonda Cristo G. [place], MD  [facility]. [place] office.

## 2023-12-09 NOTE — Assessment & Plan Note (Signed)
 Some os her chronic co-morbilities can certainly increase risk for falls as well as some medications. Fall precautions discussed. She has done PT in the past Recommend Tai chi few times per week.

## 2023-12-09 NOTE — Assessment & Plan Note (Signed)
 She has not tolerated Rosuvastatin, Pravastatin,and Zetia .

## 2023-12-09 NOTE — Assessment & Plan Note (Signed)
 According to patient, after TSH done in 09/2023 her armour thyroid  dose was increased from 50 mcg to 60 mcg daily. TSH will be checked today and further recommendation will be given accordingly.

## 2023-12-16 ENCOUNTER — Other Ambulatory Visit (INDEPENDENT_AMBULATORY_CARE_PROVIDER_SITE_OTHER)

## 2023-12-16 DIAGNOSIS — D351 Benign neoplasm of parathyroid gland: Secondary | ICD-10-CM

## 2023-12-17 ENCOUNTER — Ambulatory Visit: Payer: Self-pay | Admitting: Family Medicine

## 2023-12-17 LAB — PTH, INTACT AND CALCIUM
Calcium: 10 mg/dL (ref 8.6–10.4)
PTH: 34 pg/mL (ref 16–77)

## 2023-12-20 ENCOUNTER — Other Ambulatory Visit: Payer: Self-pay | Admitting: Family Medicine

## 2023-12-20 DIAGNOSIS — E041 Nontoxic single thyroid nodule: Secondary | ICD-10-CM | POA: Diagnosis not present

## 2023-12-20 DIAGNOSIS — E049 Nontoxic goiter, unspecified: Secondary | ICD-10-CM

## 2023-12-24 ENCOUNTER — Other Ambulatory Visit (HOSPITAL_COMMUNITY)
Admission: RE | Admit: 2023-12-24 | Discharge: 2023-12-24 | Disposition: A | Discharge status: Home / Self Care | Source: Ambulatory Visit | Attending: Family Medicine | Admitting: Family Medicine

## 2023-12-24 ENCOUNTER — Ambulatory Visit
Admission: RE | Admit: 2023-12-24 | Discharge: 2023-12-24 | Disposition: A | Discharge status: Home / Self Care | Source: Ambulatory Visit | Attending: Family Medicine | Admitting: Family Medicine

## 2023-12-24 DIAGNOSIS — E049 Nontoxic goiter, unspecified: Secondary | ICD-10-CM | POA: Insufficient documentation

## 2023-12-24 DIAGNOSIS — E041 Nontoxic single thyroid nodule: Secondary | ICD-10-CM | POA: Diagnosis not present

## 2023-12-26 ENCOUNTER — Telehealth: Payer: Self-pay | Admitting: *Deleted

## 2023-12-26 ENCOUNTER — Encounter: Payer: Self-pay | Admitting: Internal Medicine

## 2023-12-26 ENCOUNTER — Ambulatory Visit (AMBULATORY_SURGERY_CENTER): Admitting: *Deleted

## 2023-12-26 VITALS — Ht 64.0 in | Wt 240.0 lb

## 2023-12-26 DIAGNOSIS — Z1211 Encounter for screening for malignant neoplasm of colon: Secondary | ICD-10-CM

## 2023-12-26 LAB — CYTOLOGY - NON PAP

## 2023-12-26 MED ORDER — NA SULFATE-K SULFATE-MG SULF 17.5-3.13-1.6 GM/177ML PO SOLN
1.0000 | Freq: Once | ORAL | 0 refills | Status: AC
Start: 2023-12-26 — End: 2023-12-26

## 2023-12-26 NOTE — Progress Notes (Signed)
 Pt's name and DOB verified at the beginning of the pre-visit with 2 identifiers  Permission given to speak with  Pt denies any difficulty with ambulating,sitting, laying down or rolling side to side   Pt uses ambulation assistance device or has issues with mobiiity  Pt has no issues moving head neck or swallowing  No egg or soy allergy known to patient   No issues known to pt with past sedation with any surgeries or procedures  Patient denies ever being intubated  No FH of Malignant Hyperthermia  Pt is not on home 02   Pt is not on blood thinners   Pt denies issues with constipation   Pt has frequent issues with constipation RN instructed pt to use Miralax per bottles instructions a week before prep days. Pt states they will  Pt is not on dialysis  Pt denise any abnormal heart rhythms   Pt denies any upcoming cardiac testing  Patient states last seizure was Apr 27 2024 TE to MD will be placed.  Chart not reviewed by CRNA prior to [facility]  Visit by phone  Pt states weight is 240 lb  IInstructions reviewed. Pt given  both [facility] main # and MD on call # prior to instructions.  Informed pt to come inat the time discussed and that the procedure and that it is an hour prior to the actual procedure time due to need to place IV, change into gown, check and sign concent as well as meet CRNA and MD. Pt states they understand. paperwork s Pt states understanding after opportunity to ask questions the instructions given. Instructed pt to review instructions again prior to procedure and call main # given if has any questions.. Pt states they will.   Instructed pt on where to find instructions on My Chart.

## 2023-12-26 NOTE — Telephone Encounter (Signed)
 Does not need to be seen prior. Keep plans for her procedure as scheduled. Thanks for checking Dr. Abran

## 2024-01-01 DIAGNOSIS — D0439 Carcinoma in situ of skin of other parts of face: Secondary | ICD-10-CM | POA: Diagnosis not present

## 2024-01-01 DIAGNOSIS — D485 Neoplasm of uncertain behavior of skin: Secondary | ICD-10-CM | POA: Diagnosis not present

## 2024-01-07 ENCOUNTER — Ambulatory Visit: Admitting: Internal Medicine

## 2024-01-07 ENCOUNTER — Encounter: Payer: Self-pay | Admitting: Internal Medicine

## 2024-01-07 VITALS — BP 113/77 | HR 61 | Temp 97.9°F | Resp 14 | Ht 64.0 in | Wt 240.0 lb

## 2024-01-07 DIAGNOSIS — Z1211 Encounter for screening for malignant neoplasm of colon: Secondary | ICD-10-CM

## 2024-01-07 DIAGNOSIS — K635 Polyp of colon: Secondary | ICD-10-CM

## 2024-01-07 DIAGNOSIS — K6389 Other specified diseases of intestine: Secondary | ICD-10-CM

## 2024-01-07 DIAGNOSIS — D124 Benign neoplasm of descending colon: Secondary | ICD-10-CM | POA: Diagnosis not present

## 2024-01-07 DIAGNOSIS — D120 Benign neoplasm of cecum: Secondary | ICD-10-CM | POA: Diagnosis not present

## 2024-01-07 DIAGNOSIS — G4730 Sleep apnea, unspecified: Secondary | ICD-10-CM | POA: Diagnosis not present

## 2024-01-07 MED ORDER — SODIUM CHLORIDE 0.9 % IV SOLN: 500.0000 mL | Freq: Once | INTRAVENOUS | Status: DC

## 2024-01-07 NOTE — Progress Notes (Signed)
 Pt's states no medical or surgical changes since previsit or office visit.

## 2024-01-07 NOTE — Progress Notes (Signed)
 Called to room to assist during endoscopic procedure.  Patient ID and intended procedure confirmed with present staff. Received instructions for my participation in the procedure from the performing physician.

## 2024-01-07 NOTE — Patient Instructions (Signed)
 Repeat colonoscopy in 7-10 years for surveillance  Resume previous diet Continue present medications  Await pathology results  See handout for polyps YOU HAD AN ENDOSCOPIC PROCEDURE TODAY AT THE [facility]:   Refer to the procedure report that was given to you for any specific questions about what was found during the examination.  If the procedure report does not answer your questions, please call your gastroenterologist to clarify.  If you requested that your care partner not be given the details of your procedure findings, then the procedure report has been included in a sealed envelope for you to review at your convenience later.  YOU SHOULD EXPECT: Some feelings of bloating in the abdomen. Passage of more gas than usual.  Walking can help get rid of the air that was put into your GI tract during the procedure and reduce the bloating. If you had a lower endoscopy (such as a colonoscopy or flexible sigmoidoscopy) you may notice spotting of blood in your stool or on the toilet paper. If you underwent a bowel prep for your procedure, you may not have a normal bowel movement for a few days.  Please Note:  You might notice some irritation and congestion in your nose or some drainage.  This is from the oxygen used during your procedure.  There is no need for concern and it should clear up in a day or so.  SYMPTOMS TO REPORT IMMEDIATELY: Following lower endoscopy (colonoscopy or flexible sigmoidoscopy):  Excessive amounts of blood in the stool  Significant tenderness or worsening of abdominal pains  Swelling of the abdomen that is new, acute  Fever of 100F or higher  For urgent or emergent issues, a gastroenterologist can be reached at any hour by calling [phone]. Do not use MyChart messaging for urgent concerns.   DIET:  We do recommend a small meal at first, but then you may proceed to your regular diet.  Drink plenty of fluids but you should avoid alcoholic beverages for 24  hours.  ACTIVITY:  You should plan to take it easy for the rest of today and you should NOT DRIVE or use heavy machinery until tomorrow (because of the sedation medicines used during the test).    FOLLOW UP: Our staff will call the number listed on your records the next business day following your procedure.  We will call around 7:15- 8:00 am to check on you and address any questions or concerns that you may have regarding the information given to you following your procedure. If we do not reach you, we will leave a message.     If any biopsies were taken you will be contacted by phone or by letter within the next 1-3 weeks.  Please call us  at [phone] if you have not heard about the biopsies in 3 weeks.   SIGNATURES/CONFIDENTIALITY: You and/or your care partner have signed paperwork which will be entered into your electronic medical record.  These signatures attest to the fact that that the information above on your After Visit Summary has been reviewed and is understood.  Full responsibility of the confidentiality of this discharge information lies with you and/or your care-partner.

## 2024-01-07 NOTE — Op Note (Signed)
 [facility] Patient Name: Jezreel Sisk Procedure Date: 01/07/2024 11:07 AM MRN: [id number] Endoscopist: Norleen SAILOR. Abran , MD, [id number] Age: 63 Referring MD:  Date of Birth: 1961-05-09 Gender: Female Account #: [id number] Procedure:                Colonoscopy with cold snare polypectomy x 2; with                            biopsies Indications:              Screening for colorectal malignant neoplasm.                            Previous examination 2015 was normal Medicines:                Monitored Anesthesia Care Procedure:                Pre-Anesthesia Assessment:                           - Prior to the procedure, a History and Physical                            was performed, and patient medications and                            allergies were reviewed. The patient's tolerance of                            previous anesthesia was also reviewed. The risks                            and benefits of the procedure and the sedation                            options and risks were discussed with the patient.                            All questions were answered, and informed consent                            was obtained. Prior Anticoagulants: The patient has                            taken no anticoagulant or antiplatelet agents. ASA                            Grade Assessment: II - A patient with mild systemic                            disease. After reviewing the risks and benefits,                            the patient was deemed in satisfactory condition to  undergo the procedure.                           After obtaining informed consent, the colonoscope                            was passed under direct vision. Throughout the                            procedure, the patient's blood pressure, pulse, and                            oxygen saturations were monitored continuously. The                            Olympus Scope [id number]  was introduced through the                            anus and advanced to the the cecum, identified by                            appendiceal orifice and ileocecal valve. The                            ileocecal valve, appendiceal orifice, and rectum                            were photographed. The quality of the bowel                            preparation was excellent. The colonoscopy was                            performed without difficulty. The patient tolerated                            the procedure well. The bowel preparation used was                            SUPREP via split dose instruction. Scope In: 11:19:33 AM Scope Out: 11:34:49 AM Scope Withdrawal Time: 0 hours 12 minutes 57 seconds  Total Procedure Duration: 0 hours 15 minutes 16 seconds  Findings:                 Two sessile polyps were found in the descending                            colon and cecum. The polyps were 2 to 3 mm in size.                            These polyps were removed with a cold snare.                            Resection and retrieval were complete.  A diffuse area of moderate melanosis was found in                            the entire colon. Biopsies were taken with a cold                            forceps for histology.                           The exam was otherwise without abnormality on                            direct and retroflexion views. Complications:            No immediate complications. Estimated blood loss:                            None. Estimated Blood Loss:     Estimated blood loss: none. Impression:               - Two 2 to 3 mm polyps in the descending colon and                            in the cecum, removed with a cold snare. Resected                            and retrieved.                           - Melanosis in the colon. Biopsied.                           - The examination was otherwise normal on direct                             and retroflexion views. Recommendation:           - Repeat colonoscopy in 7-10 years for surveillance.                           - Patient has a contact number available for                            emergencies. The signs and symptoms of potential                            delayed complications were discussed with the                            patient. Return to normal activities tomorrow.                            Written discharge instructions were provided to the                            patient.                           -  Resume previous diet.                           - Continue present medications.                           - Await pathology results. Norleen SAILOR. Abran, MD 01/07/2024 11:40:03 AM This report has been signed electronically.

## 2024-01-07 NOTE — Progress Notes (Signed)
 Sedate, gd SR, tolerated procedure well, VSS, report to RN

## 2024-01-07 NOTE — Progress Notes (Signed)
 HISTORY OF PRESENT ILLNESS:  Ann Castillo is a 63 y.o. female presents for routine screening colonoscopy.  Previous examination in 2015 was normal  REVIEW OF SYSTEMS:  All non-GI ROS negative except for  Past Medical History:  Diagnosis Date   Allergy    Anemia, unspecified    Anxiety    Carotid stenosis, right    1-39% by doppler 10/2019   Cavus deformity of foot, acquired    Depression    Dysthymic disorder    Edema    bilateral   Esophageal reflux    Essential hypertension, benign    Hx pulmonary embolism 2006   was on estrogen    Insomnia, unspecified    Kidney stones    Lyme disease    Major depressive disorder, single episode, unspecified    Migraine, unspecified, without mention of intractable migraine without mention of status migrainosus    Myalgia and myositis, unspecified    Obesity, unspecified    Other and unspecified hyperlipidemia    Polycystic ovaries    Seizure (HCC)    Seizures (HCC)    Sleep apnea    Thyroid  nodule    2025 discovered   Tremors of nervous system    Unspecified gastritis and gastroduodenitis without mention of hemorrhage     Past Surgical History:  Procedure Laterality Date   COLONOSCOPY     no surgical history      Social History STEHANIE EKSTROM  reports that she has never smoked. She has never used smokeless tobacco. She reports current alcohol use. She reports that she does not use drugs.  family history includes ADD / ADHD in her daughter and daughter; Alzheimer's disease in her maternal uncle, mother, and paternal grandfather; Breast cancer in her maternal aunt and maternal grandmother; Diabetes in her father; Gallbladder disease in her father; Heart attack in her father; Heart disease in her father; Hypertension in her son; Learning disabilities in her daughter; Liver cancer in her father; Miscarriages / Stillbirths in her mother; Pancreatic cancer in her father; Personality disorder in her daughter; Tourette syndrome  in her son.  Allergies  Allergen Reactions   Celecoxib Anaphylaxis    REACTION: ? anaphylactic reaction 10/11 - throat closing, hand itching 1 1/2 hrs after dose but had taken this previously without difficulty. REACTION: ? anaphylactic reaction 10/11 - throat closing, hand itching 1 1/2 hrs after dose but had taken this previously without difficulty.   Pravastatin Sodium Other (See Comments)    REACTION: upper extremity muscle aches - ? anaphylactic reaction 3 days later 03/07/08   Codeine Other (See Comments)    REACTION: questionable: mental status changes   Codeine Phosphate Other (See Comments)    REACTION: questionable: mental status changes   Dilaudid [Hydromorphone Hcl] Other (See Comments)    Pain  Increases.  blindness   Estrogen Other (See Comments)    hives   Estrogenic Substance Other (See Comments)    Blood clots   Hydromorphone Hcl Other (See Comments)    Pain  Increases.  blindness   Latex Rash   Other Hives   Pravastatin Sodium Other (See Comments)    REACTION: upper extremity muscle aches - ? anaphylactic reaction 3 days later 03/07/08   Rosuvastatin Other (See Comments)    REACTION: myalgia REACTION: myalgia   Zetia  [Ezetimibe ] Other (See Comments)    Stomach cramping, diarrhea       PHYSICAL EXAMINATION: Vital signs: BP 119/65   Pulse (!) 57   Temp  97.9 F (36.6 C)   Ht 5' 4 (1.626 m)   Wt 240 lb (108.9 kg)   LMP 12/26/2012   SpO2 99%   BMI 41.20 kg/m  General: Well-developed, well-nourished, no acute distress HEENT: Sclerae are anicteric, conjunctiva pink. Oral mucosa intact Lungs: Clear Heart: Regular Abdomen: soft, nontender, nondistended, no obvious ascites, no peritoneal signs, normal bowel sounds. No organomegaly. Extremities: No edema Psychiatric: alert and oriented x3. Cooperative     ASSESSMENT:  Colon cancer screening   PLAN:  Screening colonoscopy

## 2024-01-08 ENCOUNTER — Telehealth: Payer: Self-pay | Admitting: *Deleted

## 2024-01-08 NOTE — Telephone Encounter (Signed)
 Post procedure follow up call placed, no answer and no VM option.

## 2024-01-09 LAB — SURGICAL PATHOLOGY

## 2024-01-11 ENCOUNTER — Encounter (HOSPITAL_COMMUNITY): Payer: Self-pay | Admitting: Radiology

## 2024-01-11 ENCOUNTER — Emergency Department (HOSPITAL_COMMUNITY)

## 2024-01-11 ENCOUNTER — Ambulatory Visit: Payer: Self-pay | Admitting: Internal Medicine

## 2024-01-11 ENCOUNTER — Other Ambulatory Visit: Payer: Self-pay

## 2024-01-11 ENCOUNTER — Emergency Department (HOSPITAL_COMMUNITY)
Admission: EM | Admit: 2024-01-11 | Discharge: 2024-01-11 | Disposition: A | Discharge status: Home / Self Care | Attending: Emergency Medicine | Admitting: Emergency Medicine

## 2024-01-11 DIAGNOSIS — N132 Hydronephrosis with renal and ureteral calculous obstruction: Secondary | ICD-10-CM | POA: Insufficient documentation

## 2024-01-11 DIAGNOSIS — N201 Calculus of ureter: Secondary | ICD-10-CM

## 2024-01-11 DIAGNOSIS — K8020 Calculus of gallbladder without cholecystitis without obstruction: Secondary | ICD-10-CM | POA: Diagnosis not present

## 2024-01-11 DIAGNOSIS — R1031 Right lower quadrant pain: Secondary | ICD-10-CM | POA: Diagnosis not present

## 2024-01-11 DIAGNOSIS — Z91040 Latex allergy status: Secondary | ICD-10-CM | POA: Insufficient documentation

## 2024-01-11 DIAGNOSIS — K760 Fatty (change of) liver, not elsewhere classified: Secondary | ICD-10-CM | POA: Diagnosis not present

## 2024-01-11 LAB — URINALYSIS, ROUTINE W REFLEX MICROSCOPIC
Bacteria, UA: NONE SEEN
Bilirubin Urine: NEGATIVE
Glucose, UA: NEGATIVE mg/dL
Ketones, ur: NEGATIVE mg/dL
Nitrite: NEGATIVE
Protein, ur: NEGATIVE mg/dL
RBC / HPF: >50 RBC/hpf (ref 0–5)
Specific Gravity, Urine: 1.02 (ref 1.005–1.030)
pH: 9 — ABNORMAL HIGH (ref 5.0–8.0)

## 2024-01-11 LAB — COMPREHENSIVE METABOLIC PANEL WITH GFR
ALT: 18 U/L (ref 0–44)
AST: 28 U/L (ref 15–41)
Albumin: 4.1 g/dL (ref 3.5–5.0)
Alkaline Phosphatase: 54 U/L (ref 38–126)
Anion gap: 12 (ref 5–15)
BUN: 21 mg/dL (ref 8–23)
CO2: 24 mmol/L (ref 22–32)
Calcium: 10.3 mg/dL (ref 8.9–10.3)
Chloride: 105 mmol/L (ref 98–111)
Creatinine, Ser: 1.11 mg/dL — ABNORMAL HIGH (ref 0.44–1.00)
GFR, Estimated: 57 mL/min — ABNORMAL LOW (ref >60)
Glucose, Bld: 142 mg/dL — ABNORMAL HIGH (ref 70–99)
Potassium: 4.4 mmol/L (ref 3.5–5.1)
Sodium: 141 mmol/L (ref 135–145)
Total Bilirubin: 0.8 mg/dL (ref 0.0–1.2)
Total Protein: 7 g/dL (ref 6.5–8.1)

## 2024-01-11 LAB — CBC WITH DIFFERENTIAL/PLATELET
Abs Immature Granulocytes: 0.05 K/uL (ref 0.00–0.07)
Basophils Absolute: 0.1 K/uL (ref 0.0–0.1)
Basophils Relative: 1 %
Eosinophils Absolute: 0.1 K/uL (ref 0.0–0.5)
Eosinophils Relative: 1 %
HCT: 40.5 % (ref 36.0–46.0)
Hemoglobin: 13.6 g/dL (ref 12.0–15.0)
Immature Granulocytes: 1 %
Lymphocytes Relative: 15 %
Lymphs Abs: 1.5 K/uL (ref 0.7–4.0)
MCH: 30 pg (ref 26.0–34.0)
MCHC: 33.6 g/dL (ref 30.0–36.0)
MCV: 89.4 fL (ref 80.0–100.0)
Monocytes Absolute: 0.6 K/uL (ref 0.1–1.0)
Monocytes Relative: 6 %
Neutro Abs: 7.9 K/uL — ABNORMAL HIGH (ref 1.7–7.7)
Neutrophils Relative %: 76 %
Platelets: 354 K/uL (ref 150–400)
RBC: 4.53 MIL/uL (ref 3.87–5.11)
RDW: 12.1 % (ref 11.5–15.5)
WBC: 10.2 K/uL (ref 4.0–10.5)
nRBC: 0 % (ref 0.0–0.2)

## 2024-01-11 LAB — LIPASE, BLOOD: Lipase: 41 U/L (ref 11–51)

## 2024-01-11 MED ORDER — KETOROLAC TROMETHAMINE 15 MG/ML IJ SOLN
15.0000 mg | Freq: Once | INTRAMUSCULAR | Status: AC
  Administered 2024-01-11: 15 mg via INTRAVENOUS
  Filled 2024-01-11: qty 1

## 2024-01-11 MED ORDER — FENTANYL CITRATE PF 50 MCG/ML IJ SOSY
50.0000 ug | PREFILLED_SYRINGE | Freq: Once | INTRAMUSCULAR | Status: AC
  Administered 2024-01-11: 50 ug via INTRAVENOUS
  Filled 2024-01-11: qty 1

## 2024-01-11 MED ORDER — IOHEXOL 350 MG/ML SOLN
75.0000 mL | Freq: Once | INTRAVENOUS | Status: AC | PRN
  Administered 2024-01-11: 75 mL via INTRAVENOUS

## 2024-01-11 MED ORDER — LACTATED RINGERS IV BOLUS
1000.0000 mL | Freq: Once | INTRAVENOUS | Status: AC
  Administered 2024-01-11: 1000 mL via INTRAVENOUS

## 2024-01-11 MED ORDER — ONDANSETRON HCL 4 MG/2ML IJ SOLN
4.0000 mg | Freq: Once | INTRAMUSCULAR | Status: AC
  Administered 2024-01-11: 4 mg via INTRAVENOUS
  Filled 2024-01-11: qty 2

## 2024-01-11 MED ORDER — ONDANSETRON 4 MG PO TBDP: 4.0000 mg | ORAL_TABLET | Freq: Three times a day (TID) | ORAL | 0 refills | Status: AC | PRN

## 2024-01-11 MED ORDER — HYDROCODONE-ACETAMINOPHEN 5-325 MG PO TABS: 1.0000 | ORAL_TABLET | Freq: Four times a day (QID) | ORAL | 0 refills | Status: AC | PRN

## 2024-01-11 MED ORDER — HYDROCODONE-ACETAMINOPHEN 5-325 MG PO TABS
1.0000 | ORAL_TABLET | Freq: Once | ORAL | Status: AC
  Administered 2024-01-11: 1 via ORAL
  Filled 2024-01-11: qty 1

## 2024-01-11 NOTE — ED Provider Triage Note (Signed)
 Emergency Medicine Provider Triage Evaluation Note  Ann Castillo , a 63 y.o. female  was evaluated in triage.  Pt complains of abdominal pain. Reports that pain began yesterday and has progressively worsened today.  This pain is present in the right lower quadrant with radiation from the right flank.  History of kidney stones.  Does endorse some gross hematuria earlier but that has cleared up.  No reported fever, chills or bodyaches.  Not tolerating p.o. given nausea.  Does endorse that she had a colonoscopy earlier this week with 2 polyps removed.  No reported blood in stools.  Review of Systems  Positive: As above Negative: As above  Physical Exam  BP 128/77   Pulse 68   Temp (!) 97.3 F (36.3 C)   Resp 18   Ht 5' 4 (1.626 m)   Wt 108.9 kg   LMP 12/26/2012   SpO2 96%   BMI 41.20 kg/m  Gen:   Awake, no distress   Resp:  Normal effort  MSK:   Moves extremities without difficulty  Other:  Right flank tenderness, right abdominal tenderness  Medical Decision Making  Medically screening exam initiated at 6:51 PM.  Appropriate orders placed.  Ann Castillo was informed that the remainder of the evaluation will be completed by another provider, this initial triage assessment does not replace that evaluation, and the importance of remaining in the ED until their evaluation is complete.     Ann Castillo A, PA-C 01/11/24 [phone]

## 2024-01-11 NOTE — ED Triage Notes (Signed)
 Pt endorses pain that goes from her lower back to her lower abd. She has had a couple of episodes of vomiting. She has had kidney stones in the past. She describes the pain as a sword going from her back to her stomach. She had a colonoscopy on Tuesday but doesn't feel anything is wrong from that. She feels like this is the worst cramp she has ever had. She had 2 polyps removed during the colonoscopy.

## 2024-01-11 NOTE — Discharge Instructions (Signed)
 Your CT scan shows you have a 2 mm stone in your right ureter, which is the tube that connects your kidney to your bladder.  We are providing you a prescription of hydrocodone  for severe pain.  You may take your home ketorolac  and over-the-counter Tylenol  for pain as well.  Have caution as the hydrocodone  also has Tylenol  and combined you may not take more than 4000 mg of Tylenol  per day.  Do not take the hydrocodone  while driving, operating heavy machinery, or combine with other meds or alcohol.  If you develop fever, new or worsening pain, vomiting, urinary tract infection symptoms, or any other new/concerning symptoms then return to the ER.

## 2024-01-11 NOTE — ED Provider Notes (Signed)
 [place] EMERGENCY DEPARTMENT AT [facility] Provider Note   CSN: [id number] Arrival date & time: 01/11/24  1747     Patient presents with: Abdominal Pain and Back Pain   Ann Castillo is a 63 y.o. female.   HPI 63 year old female presents with severe abdominal pain.  Pain is in her right lower abdomen and goes straight through to her right back like a knife.  Started all of a sudden a couple hours ago.  She did notice some hematuria once but not on the next 1.  No dysuria.  No fevers.  The pain is currently severe.  She tried taking ketorolac  2 different times but was unable to keep it down.  Has vomited about 3 times.  Has a history of prior kidney stones but this feels a little different.  She had a colonoscopy with 2 polyps removed about 4 days ago but has been having no issues including no abdominal pain or hematochezia since.  Prior to Admission medications   Medication Sig Start Date End Date Taking? Authorizing Provider  HYDROcodone -acetaminophen  (NORCO/VICODIN) 5-325 MG tablet Take 1 tablet by mouth every 6 (six) hours as needed for severe pain (pain score 7-10). 01/11/24  Yes Freddi Hamilton, MD  ondansetron  (ZOFRAN -ODT) 4 MG disintegrating tablet Take 1 tablet (4 mg total) by mouth every 8 (eight) hours as needed for nausea or vomiting. 01/11/24  Yes Freddi Hamilton, MD  Bempedoic Acid  (NEXLETOL ) 180 MG TABS Take 1 tablet (180 mg total) by mouth daily. 01/24/23   Shlomo Wilbert SAUNDERS, MD  Cholecalciferol (VITAMIN D ) 125 MCG (5000 UT) CAPS Take 1 capsule by mouth daily.    [provider]  Coenzyme Q10 (COQ10) 100 MG CAPS Take 1 capsule by mouth daily.    [provider]  escitalopram  (LEXAPRO ) 20 MG tablet TAKE 2 TABLETS BY MOUTH DAILY 07/18/23   Rhys Boyer T, PA-C  Evolocumab  (REPATHA  SURECLICK) 140 MG/ML SOAJ INJECT 1 PEN INTO THE SKIN EVERY 14 DAYS 12/03/23   Shlomo Wilbert SAUNDERS, MD  gabapentin  (NEURONTIN ) 100 MG capsule TAKE 1 CAPSULE BY MOUTH EVERY  MORNING AND 1 CAPSULE IN THE AFTERNOON AS NEEDED FOR ANXIETY 12/02/23   Rhys Boyer T, PA-C  gabapentin  (NEURONTIN ) 300 MG capsule TAKE 1 CAPSULE BY MOUTH EVERY EVENING 11/24/23   Hurst, Boyer T, PA-C  lisinopril -hydrochlorothiazide  (ZESTORETIC) 10-12.5 MG tablet Take 1 tablet by mouth daily.    [provider]  magnesium gluconate (MAGONATE) 500 MG tablet Take 500 mg by mouth daily.    [provider]  meloxicam (MOBIC) 15 MG tablet Take 15 mg by mouth daily as needed for pain.  02/05/11   [provider]  Milk Thistle 150 MG CAPS Take 150 mg by mouth daily.    [provider]  Nutritional Supplements (JOINT FORMULA PO) Take 1 tablet by mouth daily. Patient not taking: Reported on 12/26/2023    [provider]  SEMAGLUTIDE PO Take 48 mg by mouth once a week. Patient taking differently: Take 48 mg by mouth once a week. 10 mg    [provider]  thyroid  (ARMOUR) 60 MG tablet Take 60 mg by mouth daily before breakfast.    [provider]  UNABLE TO FIND 10 mg as needed. Med Name: CBD oil    [provider]  UNABLE TO FIND Med Name: [place] Beets 500 mg daily.    [provider]    Allergies: Celecoxib, Pravastatin sodium, Codeine, Codeine phosphate, Dilaudid [hydromorphone hcl],  Estrogen, Estrogenic substance, Hydromorphone hcl, Latex, Other, Pravastatin sodium, Rosuvastatin, and Zetia  [ezetimibe ]    Review of Systems  Constitutional:  Negative for fever.  Gastrointestinal:  Positive for abdominal pain and vomiting. Negative for nausea.  Genitourinary:  Positive for hematuria. Negative for dysuria.  Musculoskeletal:  Positive for back pain.    Updated Vital Signs BP 138/72   Pulse 71   Temp 98.1 F (36.7 C)   Resp 18   Ht 5' 4 (1.626 m)   Wt 108.9 kg   LMP 12/26/2012   SpO2 99%   BMI 41.20 kg/m   Physical Exam Vitals and nursing note reviewed.  Constitutional:      General: She is in acute distress  (in severe pain).     Appearance: She is well-developed. She is obese.  HENT:     Head: Normocephalic and atraumatic.  Cardiovascular:     Rate and Rhythm: Normal rate and regular rhythm.     Heart sounds: Normal heart sounds.  Pulmonary:     Effort: Pulmonary effort is normal.     Breath sounds: Normal breath sounds.  Abdominal:     Palpations: Abdomen is soft.     Tenderness: There is abdominal tenderness in the right lower quadrant.  Skin:    General: Skin is warm and dry.  Neurological:     Mental Status: She is alert.     (all labs ordered are listed, but only abnormal results are displayed) Labs Reviewed  CBC WITH DIFFERENTIAL/PLATELET - Abnormal; Notable for the following components:      Result Value   Neutro Abs 7.9 (*)    All other components within normal limits  COMPREHENSIVE METABOLIC PANEL WITH GFR - Abnormal; Notable for the following components:   Glucose, Bld 142 (*)    Creatinine, Ser 1.11 (*)    GFR, Estimated 57 (*)    All other components within normal limits  URINALYSIS, ROUTINE W REFLEX MICROSCOPIC - Abnormal; Notable for the following components:   pH 9.0 (*)    Hgb urine dipstick MODERATE (*)    Leukocytes,Ua MODERATE (*)    All other components within normal limits  URINE CULTURE  LIPASE, BLOOD    EKG: None  Radiology: CT ABDOMEN PELVIS W CONTRAST Result Date: 01/11/2024 CLINICAL DATA:  Right lower quadrant abdominal pain EXAM: CT ABDOMEN AND PELVIS WITH CONTRAST TECHNIQUE: Multidetector CT imaging of the abdomen and pelvis was performed using the standard protocol following bolus administration of intravenous contrast. RADIATION DOSE REDUCTION: This exam was performed according to the departmental dose-optimization program which includes automated exposure control, adjustment of the mA and/or kV according to patient size and/or use of iterative reconstruction technique. CONTRAST:  75mL OMNIPAQUE  IOHEXOL  350 MG/ML SOLN COMPARISON:  01/03/2018  FINDINGS: Lower chest: No acute abnormality. Hepatobiliary: Hepatic steatosis. Cholelithiasis. No evidence of cholecystitis. No biliary dilation. Pancreas: Unremarkable. Spleen: Unremarkable. Adrenals/Urinary Tract: Normal adrenal glands. Delayed right nephrogram. 2 mm stone in the proximal right ureter with mild adjacent periureteral stranding. Mild right hydronephrosis. Left kidney and ureter are unremarkable. Normal bladder. Stomach/Bowel: No bowel obstruction or bowel wall thickening. Stomach and appendix are within normal limits. Vascular/Lymphatic: Aortic atherosclerosis. No enlarged abdominal or pelvic lymph nodes. Reproductive: Uterus and bilateral adnexa are unremarkable. Other: No free intraperitoneal fluid or air. Musculoskeletal: No acute fracture. IMPRESSION: 1. 2 mm stone in the proximal right ureter with mild right hydronephrosis. 2. Hepatic steatosis. 3. Cholelithiasis. 4. Aortic Atherosclerosis (ICD10-I70.0). Electronically Signed   By: Norman Gatlin  M.D.   On: 01/11/2024 21:09     .Ultrasound ED Peripheral IV (Provider)  Date/Time: 01/11/2024 7:59 PM  Performed by: Freddi Hamilton, MD Authorized by: Freddi Hamilton, MD   Procedure details:    Indications: multiple failed IV attempts     Skin Prep: chlorhexidine gluconate     Location:  Left AC   Angiocath:  20 G   Bedside Ultrasound Guided: Yes     Patient tolerated procedure without complications: Yes     Dressing applied: Yes      Medications Ordered in the ED  ondansetron  (ZOFRAN ) injection 4 mg (4 mg Intravenous Given 01/11/24 1957)  fentaNYL  (SUBLIMAZE ) injection 50 mcg (50 mcg Intravenous Given 01/11/24 1958)  lactated ringers  bolus 1,000 mL (0 mLs Intravenous Stopped 01/11/24 2213)  ketorolac  (TORADOL ) 15 MG/ML injection 15 mg (15 mg Intravenous Given 01/11/24 2011)  fentaNYL  (SUBLIMAZE ) injection 50 mcg (50 mcg Intravenous Given 01/11/24 2031)  iohexol  (OMNIPAQUE ) 350 MG/ML injection 75 mL (75 mLs Intravenous Contrast  Given 01/11/24 2042)  HYDROcodone -acetaminophen  (NORCO/VICODIN) 5-325 MG per tablet 1 tablet (1 tablet Oral Given 01/11/24 2223)                                    Medical Decision Making Amount and/or Complexity of Data Reviewed Labs: ordered.    Details: Normal WBC.  Unremarkable creatinine Radiology: ordered and independent interpretation performed.    Details: Ureteral stone  Risk Prescription drug management.   Patient presents with pain that is likely from a ureteral stone.  She is feeling better after fentanyl  and Toradol .  Given some fluids.  She now feels well enough for discharge.  She has not had any UTI symptoms and while she has a few leukocytes, there is no significant bacteria and I think this is not an infection.  I think this is all just a symptomatic stone.  Will send her urine for culture but she has had UTIs before and this does not feel similar.  Otherwise, is feeling well enough for discharge, will refer to urology and give return precautions.     Final diagnoses:  Right ureteral stone    ED Discharge Orders          Ordered    HYDROcodone -acetaminophen  (NORCO/VICODIN) 5-325 MG tablet  Every 6 hours PRN        01/11/24 2202    ondansetron  (ZOFRAN -ODT) 4 MG disintegrating tablet  Every 8 hours PRN        01/11/24 2202               Freddi Hamilton, MD 01/11/24 2355

## 2024-01-13 LAB — URINE CULTURE: Culture: NO GROWTH

## 2024-01-14 DIAGNOSIS — Z1231 Encounter for screening mammogram for malignant neoplasm of breast: Secondary | ICD-10-CM | POA: Diagnosis not present

## 2024-01-14 LAB — HM MAMMOGRAPHY

## 2024-01-16 ENCOUNTER — Encounter: Payer: Self-pay | Admitting: Family Medicine

## 2024-01-22 ENCOUNTER — Ambulatory Visit (INDEPENDENT_AMBULATORY_CARE_PROVIDER_SITE_OTHER): Admitting: Family Medicine

## 2024-01-22 ENCOUNTER — Encounter: Payer: Self-pay | Admitting: Family Medicine

## 2024-01-22 VITALS — BP 110/70 | HR 70 | Resp 16 | Ht 64.0 in | Wt 242.5 lb

## 2024-01-22 DIAGNOSIS — Z6841 Body Mass Index (BMI) 40.0 and over, adult: Secondary | ICD-10-CM

## 2024-01-22 DIAGNOSIS — K8020 Calculus of gallbladder without cholecystitis without obstruction: Secondary | ICD-10-CM | POA: Diagnosis not present

## 2024-01-22 DIAGNOSIS — N200 Calculus of kidney: Secondary | ICD-10-CM | POA: Insufficient documentation

## 2024-01-22 DIAGNOSIS — E039 Hypothyroidism, unspecified: Secondary | ICD-10-CM | POA: Diagnosis not present

## 2024-01-22 DIAGNOSIS — E041 Nontoxic single thyroid nodule: Secondary | ICD-10-CM | POA: Insufficient documentation

## 2024-01-22 DIAGNOSIS — E66813 Obesity, class 3: Secondary | ICD-10-CM

## 2024-01-22 MED ORDER — THYROID 60 MG PO TABS: 60.0000 mg | ORAL_TABLET | Freq: Every day | ORAL | 1 refills | Status: DC

## 2024-01-22 NOTE — Assessment & Plan Note (Addendum)
 Symptoms have resolved. We discussed diagnosis, prognosis, treatment options. According to ED discharge from ED urology referral was placed, she has not received a call at this time. Stressed the importance of adequate hydration.

## 2024-01-22 NOTE — Progress Notes (Signed)
 ACUTE VISIT Chief Complaint  Patient presents with   Hospitalization Follow-up   HPI: [name] is a 63 y.o. female with a PMHx significant for HTN; OSA; GERD; Hypothyroidism; HLD; Pre-diabetes; Anxiety/Depression, who is here today for ED F/u for Right Ureteral Stone.  Presented to ED 01/11/2024 with abdominal and back pain associated with hematuria and vomiting. Lab Results  Component Value Date   WBC 10.2 01/11/2024   HGB 13.6 01/11/2024   HCT 40.5 01/11/2024   MCV 89.4 01/11/2024   PLT 354 01/11/2024   Lab Results  Component Value Date   NA 141 01/11/2024   CL 105 01/11/2024   K 4.4 01/11/2024   CO2 24 01/11/2024   BUN 21 01/11/2024   CREATININE 1.11 (H) 01/11/2024   GFRNONAA 57 (L) 01/11/2024   CALCIUM 10.3 01/11/2024   ALBUMIN 4.1 01/11/2024   GLUCOSE 142 (H) 01/11/2024   Lab Results  Component Value Date   ALT 18 01/11/2024   AST 28 01/11/2024   ALKPHOS 54 01/11/2024   BILITOT 0.8 01/11/2024   Lab Results  Component Value Date   LIPASE 41 01/11/2024   Urine was cultured, and was negative.   Abdominal CT 01/11/2024 saw one 2 mm stone in proximal right ureter w/ mild right hydronephrosis, hepatic steatosis, cholelithiasis, and aortic atherosclerosis. While in ED she was given Zofran  IV, Fentanyl  IV, Toradol  IV, and Hydrocodone -Acetaminophen  5-325 PO to treat her symptoms. Discharged with Hydrocodone -Acetaminophen  5-325 PO as needed q6 hours and Zofran  as needed q8 hours. Referral placed for Urology, she has not received information about referral. Reports that symptoms have resolved.  Negative for RUQ abdominal pain, nausea, vomiting, or changes in bowel habits associated with food intake.  -She is concerned about possible kidney disease, wants to be sure this is being checked regularly.  No history of CKD, EGFR slightly abnormal during recent ED visit.  She acknowledges she does not drink enough fluids.  -Seen by Dr. Polly on 12/20/2023 and he  notes that though there was evidence of possible right parathyroid adenoma on US , labs did not suggest primary hyperparathyroidism.  Thyroid  US  on 12/09/2023, which showed nodule 1 (TI-RADS 3), measuring 3.9 cm, located in the mid LEFT thyroid  lobe, meets criteria for FNA. 0.6 cm hypoechoic mass posterior to the RIGHT thyroid  lobe is suspicious for a parathyroid adenoma.  She is inquiring about thyroid  nodule Bx report.  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  Lab Results  Component Value Date   PTH 34 12/16/2023   CALCIUM 10.3 01/11/2024   -Hypothyroidism: She is currently on Armour thyroid  60 mg daily. Medication has been prescribed by integrative health specialist but due to cost, she would like for me to continue prescribing medication. She is not sure if she tried other medications in the past. She has tolerated medication well.  Lab Results  Component Value Date   TSH 0.44 12/09/2023   TSH 0.11 (A) 09/30/2023   -Mentions that she is frustrated about not been able to lose wt, she has plateau for the past 6 months despite of still been on Semaglutide. Medication has been prescribed by integrating medicine provider, who recommend continuing medications to keep wt stable and to treat prediabetes. Says that she does try to walk 10K steps regularly and cooks at home. In general she feels like she follows a healthful diet.  Review of Systems  Constitutional:  Negative for activity change, appetite change and fever.  HENT:  Negative for sore throat and trouble swallowing.   Respiratory:  Negative for cough and shortness of breath.   Cardiovascular:  Negative for chest pain and palpitations.  Endocrine: Negative for cold intolerance and heat intolerance.  Genitourinary:  Negative for decreased urine volume, dysuria and hematuria.  Musculoskeletal:  Negative for gait problem.  Skin:  Negative for rash.  Neurological:   Negative for syncope, weakness and headaches.  Psychiatric/Behavioral:  Negative for confusion.   See other pertinent positives and negatives in HPI.  Current Outpatient Medications on File Prior to Visit  Medication Sig Dispense Refill   Bempedoic Acid  (NEXLETOL ) 180 MG TABS Take 1 tablet (180 mg total) by mouth daily. 90 tablet 3   Cholecalciferol (VITAMIN D ) 125 MCG (5000 UT) CAPS Take 1 capsule by mouth daily.     Coenzyme Q10 (COQ10) 100 MG CAPS Take 1 capsule by mouth daily.     escitalopram  (LEXAPRO ) 20 MG tablet TAKE 2 TABLETS BY MOUTH DAILY 180 tablet 3   Evolocumab  (REPATHA  SURECLICK) 140 MG/ML SOAJ INJECT 1 PEN INTO THE SKIN EVERY 14 DAYS 6 mL 3   gabapentin  (NEURONTIN ) 100 MG capsule TAKE 1 CAPSULE BY MOUTH EVERY MORNING AND 1 CAPSULE IN THE AFTERNOON AS NEEDED FOR ANXIETY 60 capsule 3   gabapentin  (NEURONTIN ) 300 MG capsule TAKE 1 CAPSULE BY MOUTH EVERY EVENING 90 capsule 0   HYDROcodone -acetaminophen  (NORCO/VICODIN) 5-325 MG tablet Take 1 tablet by mouth every 6 (six) hours as needed for severe pain (pain score 7-10). 15 tablet 0   lisinopril -hydrochlorothiazide  (ZESTORETIC) 10-12.5 MG tablet Take 1 tablet by mouth daily.     magnesium gluconate (MAGONATE) 500 MG tablet Take 500 mg by mouth daily.     meloxicam (MOBIC) 15 MG tablet Take 15 mg by mouth daily as needed for pain.      Milk Thistle 150 MG CAPS Take 150 mg by mouth daily.     Nutritional Supplements (JOINT FORMULA PO) Take 1 tablet by mouth daily.     ondansetron  (ZOFRAN -ODT) 4 MG disintegrating tablet Take 1 tablet (4 mg total) by mouth every 8 (eight) hours as needed for nausea or vomiting. 10 tablet 0   SEMAGLUTIDE PO Take 48 mg by mouth once a week. (Patient taking differently: Take 48 mg by mouth once a week. 10 mg)     UNABLE TO FIND 10 mg as needed. Med Name: CBD oil     UNABLE TO FIND Med Name: [place] Beets 500 mg daily.     No current facility-administered medications on file prior to visit.   Past  Medical History:  Diagnosis Date   Allergy    Anemia, unspecified    Anxiety    Carotid stenosis, right    1-39% by doppler 10/2019   Cavus deformity of foot, acquired    Depression    Dysthymic disorder    Edema    bilateral   Esophageal reflux    Essential hypertension, benign    Hx pulmonary embolism 2006   was on estrogen    Insomnia, unspecified    Kidney stones    Lyme disease    Major depressive disorder, single episode, unspecified    Migraine, unspecified, without mention of intractable migraine without mention of status migrainosus    Myalgia and myositis, unspecified    Obesity, unspecified    Other and unspecified hyperlipidemia    Polycystic ovaries    Seizure (HCC)    Seizures (HCC)    Sleep apnea  Thyroid  nodule    2025 discovered   Tremors of nervous system    Unspecified gastritis and gastroduodenitis without mention of hemorrhage    Allergies  Allergen Reactions   Celecoxib Anaphylaxis    REACTION: ? anaphylactic reaction 10/11 - throat closing, hand itching 1 1/2 hrs after dose but had taken this previously without difficulty. REACTION: ? anaphylactic reaction 10/11 - throat closing, hand itching 1 1/2 hrs after dose but had taken this previously without difficulty.   Pravastatin Sodium Other (See Comments)    REACTION: upper extremity muscle aches - ? anaphylactic reaction 3 days later 03/07/08   Codeine Other (See Comments)    REACTION: questionable: mental status changes   Codeine Phosphate Other (See Comments)    REACTION: questionable: mental status changes   Dilaudid [Hydromorphone Hcl] Other (See Comments)    Pain  Increases.  blindness   Estrogen Other (See Comments)    hives   Estrogenic Substance Other (See Comments)    Blood clots   Hydromorphone Hcl Other (See Comments)    Pain  Increases.  blindness   Latex Rash   Other Hives   Pravastatin Sodium Other (See Comments)    REACTION: upper extremity muscle aches - ? anaphylactic  reaction 3 days later 03/07/08   Rosuvastatin Other (See Comments)    REACTION: myalgia REACTION: myalgia   Zetia  [Ezetimibe ] Other (See Comments)    Stomach cramping, diarrhea    Social History   Socioeconomic History   Marital status: Married    Spouse name: Not on file   Number of children: 3   Years of education: College   Highest education level: Not on file  Occupational History   Occupation: PAR[employer]    Employer: [employer]  Tobacco Use   Smoking status: Never   Smokeless tobacco: Never  Vaping Use   Vaping status: Never Used  Substance and Sexual Activity   Alcohol use: Yes    Alcohol/week: 0.0 - 1.0 standard drinks of alcohol   Drug use: No   Sexual activity: Not Currently    Birth control/protection: Post-menopausal    Comment: 1st intercourse- 71, partners- 1, married- 38 yr s  Other Topics Concern   Not on file  Social History Narrative   Married, works as a IT consultant.  She has significant stress from daughter, who is mentally ill and lives at home (age 49).     Caffeine: very rarely    Social Drivers of Corporate investment banker Strain: Not at Risk (09/10/2022)   Received from [facility]    Financial Resource Strain: 1  Food Insecurity: Not at Risk (09/10/2022)   Received from [facility] Insecurity    Food: 1  Transportation Needs: Not at Risk (09/10/2022)   Received from [facility] Needs    Transportation: 1  Physical Activity: Not at Risk (09/10/2022)   Received from [facility]   Physical Activity    Physical Activity: 1  Stress: Not at Risk (09/10/2022)   Received from [facility]   Stress    Stress: 1  Social Connections: Not at Risk (09/10/2022)   Received from [facility]   Social Connections    Connectedness: 1   Vitals:   01/22/24 0843  BP: 110/70  Pulse: 70  Resp: 16  SpO2: 98%   Wt Readings from Last 3 Encounters:  01/22/24 242 lb 8 oz (110 kg)  01/11/24 240 lb (108.9 kg)  01/07/24 240 lb (  108.9  kg)   Body mass index is 41.63 kg/m.  Physical Exam Vitals and nursing note reviewed.  Constitutional:      General: She is not in acute distress.    Appearance: She is well-developed.  HENT:     Head: Normocephalic and atraumatic.     Mouth/Throat:     Mouth: Mucous membranes are moist.     Pharynx: Oropharynx is clear.  Eyes:     Conjunctiva/sclera: Conjunctivae normal.  Neck:     Thyroid : Thyromegaly (left thryoid nodule) present.  Cardiovascular:     Rate and Rhythm: Normal rate and regular rhythm.     Pulses:          Dorsalis pedis pulses are 2+ on the right side and 2+ on the left side.     Heart sounds: No murmur heard.    Comments: Trace pitting LE edema, bilateral. Pulmonary:     Effort: Pulmonary effort is normal. No respiratory distress.     Breath sounds: Normal breath sounds.  Abdominal:     Palpations: Abdomen is soft. There is no mass.     Tenderness: There is no abdominal tenderness. There is no right CVA tenderness or left CVA tenderness.  Lymphadenopathy:     Cervical: No cervical adenopathy.  Skin:    General: Skin is warm.     Findings: No erythema or rash.  Neurological:     General: No focal deficit present.     Mental Status: She is alert and oriented to person, place, and time.     Gait: Gait normal.  Psychiatric:        Mood and Affect: Mood and affect normal.    ASSESSMENT AND PLAN: [name] was seen here today for Ed follow up and to address other concerns.  Hypothyroidism (acquired) Assessment & Plan: Problem has been adequately controlled. This has been managed by integrative medicine provider.  She would like for me to continue prescribed medication due to cost, continue Armour thyroid  60 mg daily. Last TSH 0.4 in 11/2023.  Orders: -     Thyroid ; Take 1 tablet (60 mg total) by mouth daily before breakfast.  Dispense: 90 tablet; Refill: 1  Class 3 severe obesity due to excess calories without serious comorbidity with  body mass index (BMI) of 45.0 to 49.9 in adult Assessment & Plan: Problem is stable. She is frustrated because her wt has plateau, has not noted changes in the past 6 months. Currently on Semaglutide,  which has been prescribed by integrative medicine provider. Consistency with healthy diet and physical activity encouraged.   Nephrolithiasis Assessment & Plan: Symptoms have resolved. We discussed diagnosis, prognosis, treatment options. According to ED discharge from ED urology referral was placed, she has not received a call at this time. Stressed the importance of adequate hydration.   Calculus of gallbladder without cholecystitis without obstruction Assessment & Plan: Incidental finding on abdominal CT, asymptomatic. Instructed about warning signs. I do not think surgical consultation is needed at this time.   Thyroid  nodule Assessment & Plan: Left thyroid  nodule. We discussed pathology result. Biopsy negative for malignancy.12/24/23: Benign follicular nodule (Bethesda category II)  We will plan on thyroid  US  in 11/2024.   Abnormal renal function: Mild. Concerned about possible kidney disease, creatinine and eGFR has been in normal range. We reviewed prior lab results, Cr and e GFR's, offered repeating it today, agrees with repeating lab next f/u visit.  Continue adequate hydration, low-salt diet, and caution with NSAIDs.  I personally spent a total of 43 minutes in the care of the patient today including preparing to see the patient, getting/reviewing separately obtained history, performing a medically appropriate exam/evaluation, counseling and educating, documenting clinical information in the EHR, and communicating results.  Return if symptoms worsen or fail to improve, for keep next appointment.  I,Ann Castillo,acting as a Neurosurgeon for Ann Hinojosa [place], MD.,have documented all relevant documentation on the behalf of Ann Kinnison [place], MD,as directed by  Foster Sonnier [place], MD while in  the presence of Ann Fusco [place], MD.  I, Ann Hinch [place], MD, have reviewed all documentation for this visit. The documentation on 01/22/24 for the exam, diagnosis, procedures, and orders are all accurate and complete.  Monika Chestang G. [place], MD  [facility]. [place] office.

## 2024-01-22 NOTE — Assessment & Plan Note (Addendum)
 Problem has been adequately controlled. This has been managed by integrative medicine provider.  She would like for me to continue prescribed medication due to cost, continue Armour thyroid  60 mg daily. Last TSH 0.4 in 11/2023.

## 2024-01-22 NOTE — Patient Instructions (Addendum)
 A few things to remember from today's visit:  Hypothyroidism (acquired)  Class 3 severe obesity due to excess calories without serious comorbidity with body mass index (BMI) of 45.0 to 49.9 in adult  Nephrolithiasis  Calculus of gallbladder without cholecystitis without obstruction  No changes today.  If you need refills for medications you take chronically, please call your pharmacy. Do not use My Chart to request refills or for acute issues that need immediate attention. If you send a my chart message, it may take a few days to be addressed, specially if I am not in the office.  Please be sure medication list is accurate. If a new problem present, please set up appointment sooner than planned today.

## 2024-01-22 NOTE — Assessment & Plan Note (Addendum)
 Left thyroid  nodule. We discussed pathology result. Biopsy negative for malignancy.12/24/23: Benign follicular nodule (Bethesda category II)  We will plan on thyroid  US  in 11/2024.

## 2024-01-22 NOTE — Assessment & Plan Note (Addendum)
 Incidental finding on abdominal CT, asymptomatic. Instructed about warning signs. I do not think surgical consultation is needed at this time.

## 2024-01-22 NOTE — Assessment & Plan Note (Addendum)
 Problem is stable. She is frustrated because her wt has plateau, has not noted changes in the past 6 months. Currently on Semaglutide,  which has been prescribed by integrative medicine provider. Consistency with healthy diet and physical activity encouraged.

## 2024-01-23 DIAGNOSIS — D0439 Carcinoma in situ of skin of other parts of face: Secondary | ICD-10-CM | POA: Diagnosis not present

## 2024-02-01 ENCOUNTER — Encounter: Payer: Self-pay | Admitting: Family Medicine

## 2024-02-01 DIAGNOSIS — Z23 Encounter for immunization: Secondary | ICD-10-CM

## 2024-02-02 DIAGNOSIS — H9202 Otalgia, left ear: Secondary | ICD-10-CM | POA: Diagnosis not present

## 2024-02-04 MED ORDER — COVID-19 MRNA VACC (MODERNA) 50 MCG/0.5ML IM SUSP: 0.5000 mL | Freq: Once | INTRAMUSCULAR | 0 refills | Status: AC

## 2024-02-17 ENCOUNTER — Other Ambulatory Visit: Payer: Self-pay | Admitting: Cardiology

## 2024-02-18 DIAGNOSIS — S90811A Abrasion, right foot, initial encounter: Secondary | ICD-10-CM | POA: Diagnosis not present

## 2024-02-18 DIAGNOSIS — W19XXXA Unspecified fall, initial encounter: Secondary | ICD-10-CM | POA: Diagnosis not present

## 2024-02-18 DIAGNOSIS — T148XXA Other injury of unspecified body region, initial encounter: Secondary | ICD-10-CM | POA: Diagnosis not present

## 2024-02-18 DIAGNOSIS — S99921A Unspecified injury of right foot, initial encounter: Secondary | ICD-10-CM | POA: Diagnosis not present

## 2024-02-24 ENCOUNTER — Encounter: Admitting: Obstetrics and Gynecology

## 2024-03-10 ENCOUNTER — Ambulatory Visit: Admitting: Family Medicine

## 2024-03-10 DIAGNOSIS — Z0000 Encounter for general adult medical examination without abnormal findings: Secondary | ICD-10-CM

## 2024-03-10 NOTE — Progress Notes (Signed)
 PATIENT CHECK-IN and HEALTH RISK ASSESSMENT QUESTIONNAIRE:  -completed by phone/video for upcoming Medicare Preventive Visit   Pre-Visit Check-in: 1)Vitals (height, wt, BP, etc) - record in vitals section for visit on day of visit Request home vitals (wt, BP, etc.) and enter into vitals, THEN update Vital Signs SmartPhrase below at the top of the HPI. See below.  2)Review and Update Medications, Allergies PMH, Surgeries, Social history in Epic 3)Hospitalizations in the last year with date/reason? n  4)Review and Update Care Team (patient's specialists) in Epic 5) Complete PHQ9 in Epic  6) Complete Fall Screening in Epic 7)Review all Health Maintenance Due and order if not done.  Medicare Wellness Patient Questionnaire:  Answer theses question about your habits: How often do you have a drink containing alcohol?rare, once a month How many drinks containing alcohol do you have on a typical day when you are drinking? 1 glass How often do you have six or more drinks on one occasion?n Have you ever smoked?n Quit date if applicable? na How many packs a day do/did you smoke? na Do you use smokeless tobacco?na Do you use an illicit drugs?na On average, how many days per week do you engage in moderate to strenuous exercise (like a brisk walk)?12000 a day stretching, some balance Typical breakfast: cottage cheese and blueberries or palin greek yogurt and blueberries Typical lunch: salad with grilled chicken or [place] sandwich on keto bread Typical dinner: fish twice a week, [place], chicken, veggies, broccoli and broccoli sprouts Typical snacks: sweet tooth, chocolate, sweets, salty  Beverages: water, tea - herbal tea Sleep: is good Stress: some with current events in the world  Answer theses question about your everyday activities: Can you perform most household chores?y Are you deaf or have significant trouble hearing?had hearing test last month and will be getting hearing aids Do you feel  that you have a problem with memory?some, sometimes will have difficulty with word Do you feel safe at home?y Last dentist visit?goes on a regular basis 8. Do you have any difficulty performing your everyday activities?n Are you having any difficulty walking, taking medications on your own, and or difficulty managing daily home needs?n Do you have difficulty walking or climbing stairs?n Do you have difficulty dressing or bathing?n Do you have difficulty doing errands alone such as visiting a doctor's office or shopping?n Do you currently have any difficulty preparing food and eating?n Do you currently have any difficulty using the toilet?n Do you have any difficulty managing your finances?n Do you have any difficulties with housekeeping of managing your housekeeping?n   Do you have Advanced Directives in place (Living Will, Healthcare Power or Attorney)? Yes has hcpoa   Last eye Exam and location?goes yearly, [facility] and now [facility] on [address]   Do you currently use prescribed or non-prescribed narcotic or opioid pain medications?n - only took when had injury or rarely when back spasms  Do you have a history or close family history of breast, ovarian, tubal or peritoneal cancer or a family member with BRCA (breast cancer susceptibility 1 and 2) gene mutations? PMH/FH   ----------------------------------------------------------------------------------------------------------------------------------------------------------------------------------------------------------------------  Because this visit was a virtual/telehealth visit, some criteria may be missing or patient reported. Any vitals not documented were not able to be obtained and vitals that have been documented are patient reported.    MEDICARE ANNUAL PREVENTIVE VISIT WITH PROVIDER: (Welcome to Medicare, initial annual wellness or annual wellness exam)  Virtual Visit via Video Note  I connected with Earleen K  Delany on 03/10/24 by a video enabled telemedicine application and verified that I am speaking with the correct person using two identifiers.  Location patient: car Location provider:work or home office Persons participating in the virtual visit: patient, provider  Concerns and/or follow up today: has started walking 87999 steps per day and is stretching. She is taking several supplements including Vit D, Vit B12, tumeric. Also is working on her diet. She has had several falls this year - has had PT, changed shoes, holds on to railings and is cautious when stands now. Reports has orthostatic issues. Taking Tai Chi which helps.   See HM section in Epic for other details of completed HM.    ROS: negative for report of fevers, unintentional weight loss, vision changes, vision loss, hearing loss or change, chest pain, sob, hemoptysis, melena, hematochezia, hematuria, falls, bleeding or bruising, thoughts of suicide or self harm, memory loss  Patient-completed extensive health risk assessment - reviewed and discussed with the patient: See Health Risk Assessment completed with patient prior to the visit either above or in recent phone note. This was reviewed in detailed with the patient today and appropriate recommendations, orders and referrals were placed as needed per Summary below and patient instructions.   Review of Medical History: -PMH, PSH, Family History and current specialty and care providers reviewed and updated and listed below   Patient Care Team: [place], Betty G, MD as PCP - General (Family Medicine) Shlomo Wilbert SAUNDERS, MD as PCP - Cardiology (Cardiology)   Past Medical History:  Diagnosis Date   Allergy    Anemia, unspecified    Anxiety    Carotid stenosis, right    1-39% by doppler 10/2019   Cavus deformity of foot, acquired    Depression    Dysthymic disorder    Edema    bilateral   Esophageal reflux    Essential hypertension, benign    Hx pulmonary embolism 2006    was on estrogen    Insomnia, unspecified    Kidney stones    Lyme disease    Major depressive disorder, single episode, unspecified    Migraine, unspecified, without mention of intractable migraine without mention of status migrainosus    Myalgia and myositis, unspecified    Obesity, unspecified    Other and unspecified hyperlipidemia    Polycystic ovaries    Seizure (HCC)    Seizures (HCC)    Sleep apnea    Thyroid  nodule    2025 discovered   Tremors of nervous system    Unspecified gastritis and gastroduodenitis without mention of hemorrhage     Past Surgical History:  Procedure Laterality Date   COLONOSCOPY     no surgical history      Social History   Socioeconomic History   Marital status: Married    Spouse name: Not on file   Number of children: 3   Years of education: College   Highest education level: Not on file  Occupational History   Occupation: [employer]    Employer: [employer]  Tobacco Use   Smoking status: Never   Smokeless tobacco: Never  Vaping Use   Vaping status: Never Used  Substance and Sexual Activity   Alcohol use: Yes    Alcohol/week: 0.0 - 1.0 standard drinks of alcohol   Drug use: No   Sexual activity: Not Currently    Birth control/protection: Post-menopausal    Comment: 1st intercourse- 68, partners- 1, married- 38 yr s  Other Topics Concern  Not on file  Social History Narrative   Married, works as a IT consultant.  She has significant stress from daughter, who is mentally ill and lives at home (age 1).     Caffeine: very rarely    Social Drivers of Corporate investment banker Strain: Not at Risk (09/10/2022)   Received from [facility]    Financial Resource Strain: 1  Food Insecurity: Not at Risk (09/10/2022)   Received from [facility]    Food: 1  Transportation Needs: Not at Risk (09/10/2022)   Received from [facility] [employer] [facility]    Transportation: 1  Physical Activity: Not  at Risk (09/10/2022)   Received from [facility]   Physical Activity    Physical Activity: 1  Stress: Not at Risk (09/10/2022)   Received from [facility]   Stress    Stress: 1  Social Connections: Not at Risk (09/10/2022)   Received from [facility]   Social Connections    Connectedness: 1  Intimate Partner Violence: Low Risk  (09/06/2023)   Received from [facility]   Interpersonal Safety    How often does anyone, including family and friends, physically hurt you? : Never    How often does anyone, including family and friends, insult or talk down to you?  : Never    How often does anyone, including family and friends, threaten you with harm? : Never    How often does anyone, including family and friends, scream or curse at you? : Never    Family History  Problem Relation Age of Onset   Miscarriages / [place] Mother    Alzheimer's disease Mother    Diabetes Father    Heart disease Father    Liver cancer Father    Pancreatic cancer Father    Gallbladder disease Father    Heart attack Father        x5, first @ 40   Breast cancer Maternal Aunt    Alzheimer's disease Maternal Uncle    Breast cancer Maternal Grandmother    Alzheimer's disease Paternal Grandfather    Learning disabilities Daughter        61 y.o mental capacity   ADD / ADHD Daughter    Personality disorder Daughter    ADD / ADHD Daughter    Hypertension Son    Tourette syndrome Son    Colon cancer Neg Hx    Colon polyps Neg Hx    Esophageal cancer Neg Hx    Stomach cancer Neg Hx    Rectal cancer Neg Hx     Current Outpatient Medications on File Prior to Visit  Medication Sig Dispense Refill   Cholecalciferol (VITAMIN D ) 125 MCG (5000 UT) CAPS Take 1 capsule by mouth daily.     Coenzyme Q10 (COQ10) 100 MG CAPS Take 1 capsule by mouth daily.     escitalopram  (LEXAPRO ) 20 MG tablet TAKE 2 TABLETS BY MOUTH DAILY 180 tablet 3   Evolocumab  (REPATHA  SURECLICK) 140 MG/ML SOAJ INJECT 1 PEN INTO THE SKIN EVERY 14 DAYS  6 mL 3   gabapentin  (NEURONTIN ) 100 MG capsule TAKE 1 CAPSULE BY MOUTH EVERY MORNING AND 1 CAPSULE IN THE AFTERNOON AS NEEDED FOR ANXIETY 60 capsule 3   gabapentin  (NEURONTIN ) 300 MG capsule TAKE 1 CAPSULE BY MOUTH EVERY EVENING 90 capsule 0   HYDROcodone -acetaminophen  (NORCO/VICODIN) 5-325 MG tablet Take 1 tablet by mouth every 6 (six) hours as needed for severe pain (pain score 7-10).  15 tablet 0   lisinopril -hydrochlorothiazide  (ZESTORETIC) 10-12.5 MG tablet Take 1 tablet by mouth daily.     magnesium gluconate (MAGONATE) 500 MG tablet Take 500 mg by mouth daily.     meloxicam (MOBIC) 15 MG tablet Take 15 mg by mouth daily as needed for pain.      Milk Thistle 150 MG CAPS Take 150 mg by mouth daily.     NEXLETOL  180 MG TABS TAKE 1 TABLET BY MOUTH EVERY DAY 90 tablet 3   Nutritional Supplements (JOINT FORMULA PO) Take 1 tablet by mouth daily.     ondansetron  (ZOFRAN -ODT) 4 MG disintegrating tablet Take 1 tablet (4 mg total) by mouth every 8 (eight) hours as needed for nausea or vomiting. 10 tablet 0   SEMAGLUTIDE PO Take 48 mg by mouth once a week. (Patient taking differently: Take 48 mg by mouth once a week. 10 mg)     thyroid  (ARMOUR) 60 MG tablet Take 1 tablet (60 mg total) by mouth daily before breakfast. 90 tablet 1   UNABLE TO FIND 10 mg as needed. Med Name: CBD oil     UNABLE TO FIND Med Name: [place] Beets 500 mg daily.     No current facility-administered medications on file prior to visit.    Allergies  Allergen Reactions   Celecoxib Anaphylaxis    REACTION: ? anaphylactic reaction 10/11 - throat closing, hand itching 1 1/2 hrs after dose but had taken this previously without difficulty. REACTION: ? anaphylactic reaction 10/11 - throat closing, hand itching 1 1/2 hrs after dose but had taken this previously without difficulty.   Pravastatin Sodium Other (See Comments)    REACTION: upper extremity muscle aches - ? anaphylactic reaction 3 days later 03/07/08   Codeine Other (See  Comments)    REACTION: questionable: mental status changes   Codeine Phosphate Other (See Comments)    REACTION: questionable: mental status changes   Dilaudid [Hydromorphone Hcl] Other (See Comments)    Pain  Increases.  blindness   Estrogen Other (See Comments)    hives   Estrogenic Substance Other (See Comments)    Blood clots   Hydromorphone Hcl Other (See Comments)    Pain  Increases.  blindness   Latex Rash   Other Hives   Pravastatin Sodium Other (See Comments)    REACTION: upper extremity muscle aches - ? anaphylactic reaction 3 days later 03/07/08   Rosuvastatin Other (See Comments)    REACTION: myalgia REACTION: myalgia   Zetia  [Ezetimibe ] Other (See Comments)    Stomach cramping, diarrhea       Physical Exam Vitals requested from patient and listed below if patient had equipment and was able to obtain at home for this virtual visit: There were no vitals filed for this visit. Estimated body mass index is 41.63 kg/m as calculated from the following:   Height as of 01/22/24: 5' 4 (1.626 m).   Weight as of 01/22/24: 242 lb 8 oz (110 kg).  EKG (optional): deferred due to virtual visit  GENERAL: alert, oriented, no acute distress detected, full vision exam deferred due to pandemic and/or virtual encounter  HEENT: atraumatic, conjunttiva clear, no obvious abnormalities on inspection of external nose and ears  NECK: normal movements of the head and neck  LUNGS: on inspection no signs of respiratory distress, breathing rate appears normal, no obvious gross SOB, gasping or wheezing  CV: no obvious cyanosis  MS: moves all visible extremities without noticeable abnormality  PSYCH/NEURO: pleasant and cooperative, no  obvious depression or anxiety, speech and thought processing grossly intact, Cognitive function grossly intact  Flowsheet Row Office Visit from [facility]/05/2023 in [facility] [place]  PHQ-9 Total Score 6        03/10/2024    6:32 PM  02/05/2023   10:12 AM 08/20/2011    2:09 PM  Depression screen PHQ 2/9  Decreased Interest 0 0 0  Down, Depressed, Hopeless 0 1 0  PHQ - 2 Score 0 1 0   Altered sleeping  1   Tired, decreased energy  1   Change in appetite  0   Feeling bad or failure about yourself   1   Trouble concentrating  2   Moving slowly or fidgety/restless  0   Suicidal thoughts  0   PHQ-9 Score  6   Difficult doing work/chores  Not difficult at all      Data saved with a previous flowsheet row definition       12/09/2023    8:56 AM 03/10/2024    6:27 PM  Fall Risk  Falls in the past year? 0 1  Was there an injury with Fall? 0 1  Fall Risk Category Calculator 0 3  Patient at Risk for Falls Due to Other (Comment)   Fall risk Follow up Falls evaluation completed Falls evaluation completed;Education provided     SUMMARY AND PLAN:  Encounter for Medicare annual wellness exam   Discussed applicable health maintenance/preventive health measures and advised and referred or ordered per patient preferences: -she plans to schedule her pap -reports had her flu and covid shots in september Health Maintenance  Topic Date Due   Cervical Cancer Screening (HPV/Pap Cotest)  04/01/2023   Influenza Vaccine  12/27/2023   COVID-19 Vaccine (4 - 2025-26 season) 01/27/2024   Medicare Annual Wellness (AWV)  03/10/2025   Mammogram  01/13/2026   Colonoscopy  01/07/2031   DTaP/Tdap/Td (4 - Td or Tdap) 02/24/2033   Pneumococcal Vaccine: 50+ Years  Completed   Hepatitis C Screening  Completed   HIV Screening  Completed   Zoster Vaccines- Shingrix  Completed   Hepatitis B Vaccines 19-59 Average Risk  Aged Out   HPV VACCINES  Aged Out   Meningococcal B Vaccine  Aged Out      Education and counseling on the following was provided based on the above review of health and a plan/checklist for the patient, along with additional information discussed, was provided for the patient in the patient instructions  :   -Provided counseling and plan for increased risk of falling if applicable per above screening. Reviewed and demonstrated safe balance exercises that can be done at home to improve balance and discussed exercise guidelines for adults with include balance exercises at least 3 days per week.  -Advised and counseled on a healthy lifestyle - including the importance of a healthy diet, regular physical activity, sleep, and stress management. -Reviewed patient's current diet. Advised and counseled on a whole foods based healthy diet. A summary of a healthy diet was provided in the Patient Instructions.  -reviewed patient's current physical activity level and discussed exercise guidelines for adults. Congratulated on healthy habits. Discussed community resources and ideas for safe exercise at home to assist in meeting exercise guideline recommendations in a safe and healthy way.  -Advise yearly dental visits at minimum and regular eye exams   Follow up: see patient instructions     Patient Instructions  I really enjoyed getting to talk with you  today! I am available on Tuesdays and Thursdays for virtual visits if you have any questions or concerns, or if I can be of any further assistance.   CHECKLIST FROM ANNUAL WELLNESS VISIT:  -Follow up (please call to schedule if not scheduled after visit):   -yearly for annual wellness visit with primary care office  Here is a list of your preventive care/health maintenance measures and the plan for each if any are due:  PLAN For any measures below that may be due:    1. Please schedule your pap smear.   2. Please send proof of receipt of your vaccines so that we can update your vaccine record. Thanks!  Health Maintenance  Topic Date Due   Cervical Cancer Screening (HPV/Pap Cotest)  04/01/2023   Medicare Annual Wellness (AWV)  09/10/2023   Influenza Vaccine  12/27/2023   COVID-19 Vaccine (4 - 2025-26 season) 01/27/2024   Mammogram  01/13/2026    Colonoscopy  01/07/2031   DTaP/Tdap/Td (4 - Td or Tdap) 02/24/2033   Pneumococcal Vaccine: 50+ Years  Completed   Hepatitis C Screening  Completed   HIV Screening  Completed   Zoster Vaccines- Shingrix  Completed   Hepatitis B Vaccines 19-59 Average Risk  Aged Out   HPV VACCINES  Aged Out   Meningococcal B Vaccine  Aged Out    -See a dentist at least yearly  -Get your eyes checked and then per your eye specialist's recommendations  -Other issues addressed today:   -I have included below further information regarding a healthy whole foods based diet, physical activity guidelines for adults, stress management and opportunities for social connections. I hope you find this information useful.   -----------------------------------------------------------------------------------------------------------------------------------------------------------------------------------------------------------------------------------------------------------    NUTRITION: -eat real food: lots of colorful vegetables (half the plate) and fruits -5-7 servings of vegetables and fruits per day (fresh or steamed is best), exp. 2 servings of vegetables with lunch and dinner and 2 servings of fruit per day. Berries and greens such as kale and collards are great choices.  -consume on a regular basis:  fresh fruits, fresh veggies, fish, nuts, seeds, healthy oils (such as olive oil, avocado oil), whole grains (make sure for bread/pasta/crackers/etc., that the first ingredient on label contains the word whole), legumes. -can eat small amounts of dairy and lean meat (no larger than the palm of your hand), but avoid processed meats such as ham, bacon, lunch meat, etc. -drink water -try to avoid fast food and pre-packaged foods, processed meat, ultra processed foods/beverages (donuts, candy, etc.) -most experts advise limiting sodium to < 2300mg  per day, should limit further is any chronic conditions such as high  blood pressure, heart disease, diabetes, etc. The American Heart Association advised that < 1500mg  is is ideal -try to avoid foods/beverages that contain any ingredients with names you do not recognize  -try to avoid foods/beverages  with added sugar or sweeteners/sweets  -try to avoid sweet drinks (including diet drinks): soda, juice, Gatorade, sweet tea, power drinks, diet drinks -try to avoid white rice, white bread, pasta (unless whole grain)  EXERCISE GUIDELINES FOR ADULTS: -if you wish to increase your physical activity, do so gradually and with the approval of your doctor -STOP and seek medical care immediately if you have any chest pain, chest discomfort or trouble breathing when starting or increasing exercise  -move and stretch your body, legs, feet and arms when sitting for long periods -Physical activity guidelines for optimal health in adults: -get at least 150 minutes per week  of moderate exercise (can talk, but not sing); this is about 20-30 minutes of sustained activity 5-7 days per week or two 10-15 minute episodes of sustained activity 5-7 days per week -do some muscle building/resistance training/strength training at least 2 days per week  -balance exercises 3+ days per week:   Stand somewhere where you have something sturdy to hold onto if you lose balance    1) lift up on toes, then back down, start with 5x per day and work up to 20x   2) stand and lift one leg straight out to the side so that foot is a few inches of the floor, start with 5x each side and work up to 20x each side   3) stand on one foot, start with 5 seconds each side and work up to 20 seconds on each side  If you need ideas or help with getting more active:  -Silver sneakers https://tools.silversneakers.com  -Walk with a Doc: http://www.duncan-williams.com/  -try to include resistance (weight lifting/strength building) and balance exercises twice per week: or the following link for  ideas: http://castillo-powell.com/  BuyDucts.dk  STRESS MANAGEMENT: -can try meditating, or just sitting quietly with deep breathing while intentionally relaxing all parts of your body for 5 minutes daily -if you need further help with stress, anxiety or depression please follow up with your primary doctor or contact the wonderful folks at [facility]: [phone]  SOCIAL CONNECTIONS: -options in [place] if you wish to engage in more social and exercise related activities:  -Silver sneakers https://tools.silversneakers.com  -Walk with a Doc: http://www.duncan-williams.com/  -Check out the [facility] Active Adults 50+ section on the [place] [employer] (hiking clubs, book clubs, cards and games, chess, exercise classes, aquatic classes and much more) - see the website for details: https://www.[place].gov/departments/parks-recreation/active-adults50  -YouTube has lots of exercise videos for different ages and abilities as well  -[facility] (a variety of indoor and outdoor inperson activities for adults). [phone]. [address]  -Virtual Online Classes (a variety of topics): see seniorplanet.org or call ([phone])688-4485  -consider volunteering at a school, hospice center, church, senior center or elsewhere            Ann Castillo Cramp, DO

## 2024-03-10 NOTE — Patient Instructions (Signed)
 I really enjoyed getting to talk with you today! I am available on Tuesdays and Thursdays for virtual visits if you have any questions or concerns, or if I can be of any further assistance.   CHECKLIST FROM ANNUAL WELLNESS VISIT:  -Follow up (please call to schedule if not scheduled after visit):   -yearly for annual wellness visit with primary care office  Here is a list of your preventive care/health maintenance measures and the plan for each if any are due:  PLAN For any measures below that may be due:    1. Please schedule your pap smear.   2. Please send proof of receipt of your vaccines so that we can update your vaccine record. Thanks!  Health Maintenance  Topic Date Due   Cervical Cancer Screening (HPV/Pap Cotest)  04/01/2023   Medicare Annual Wellness (AWV)  09/10/2023   Influenza Vaccine  12/27/2023   COVID-19 Vaccine (4 - 2025-26 season) 01/27/2024   Mammogram  01/13/2026   Colonoscopy  01/07/2031   DTaP/Tdap/Td (4 - Td or Tdap) 02/24/2033   Pneumococcal Vaccine: 50+ Years  Completed   Hepatitis C Screening  Completed   HIV Screening  Completed   Zoster Vaccines- Shingrix  Completed   Hepatitis B Vaccines 19-59 Average Risk  Aged Out   HPV VACCINES  Aged Out   Meningococcal B Vaccine  Aged Out    -See a dentist at least yearly  -Get your eyes checked and then per your eye specialist's recommendations  -Other issues addressed today:   -I have included below further information regarding a healthy whole foods based diet, physical activity guidelines for adults, stress management and opportunities for social connections. I hope you find this information useful.   -----------------------------------------------------------------------------------------------------------------------------------------------------------------------------------------------------------------------------------------------------------    NUTRITION: -eat real food: lots of colorful  vegetables (half the plate) and fruits -5-7 servings of vegetables and fruits per day (fresh or steamed is best), exp. 2 servings of vegetables with lunch and dinner and 2 servings of fruit per day. Berries and greens such as kale and collards are great choices.  -consume on a regular basis:  fresh fruits, fresh veggies, fish, nuts, seeds, healthy oils (such as olive oil, avocado oil), whole grains (make sure for bread/pasta/crackers/etc., that the first ingredient on label contains the word whole), legumes. -can eat small amounts of dairy and lean meat (no larger than the palm of your hand), but avoid processed meats such as ham, bacon, lunch meat, etc. -drink water -try to avoid fast food and pre-packaged foods, processed meat, ultra processed foods/beverages (donuts, candy, etc.) -most experts advise limiting sodium to < 2300mg  per day, should limit further is any chronic conditions such as high blood pressure, heart disease, diabetes, etc. The American Heart Association advised that < 1500mg  is is ideal -try to avoid foods/beverages that contain any ingredients with names you do not recognize  -try to avoid foods/beverages  with added sugar or sweeteners/sweets  -try to avoid sweet drinks (including diet drinks): soda, juice, Gatorade, sweet tea, power drinks, diet drinks -try to avoid white rice, white bread, pasta (unless whole grain)  EXERCISE GUIDELINES FOR ADULTS: -if you wish to increase your physical activity, do so gradually and with the approval of your doctor -STOP and seek medical care immediately if you have any chest pain, chest discomfort or trouble breathing when starting or increasing exercise  -move and stretch your body, legs, feet and arms when sitting for long periods -Physical activity guidelines for optimal health in adults: -  get at least 150 minutes per week of moderate exercise (can talk, but not sing); this is about 20-30 minutes of sustained activity 5-7 days per week  or two 10-15 minute episodes of sustained activity 5-7 days per week -do some muscle building/resistance training/strength training at least 2 days per week  -balance exercises 3+ days per week:   Stand somewhere where you have something sturdy to hold onto if you lose balance    1) lift up on toes, then back down, start with 5x per day and work up to 20x   2) stand and lift one leg straight out to the side so that foot is a few inches of the floor, start with 5x each side and work up to 20x each side   3) stand on one foot, start with 5 seconds each side and work up to 20 seconds on each side  If you need ideas or help with getting more active:  -Silver sneakers https://tools.silversneakers.com  -Walk with a Doc: http://www.duncan-williams.com/  -try to include resistance (weight lifting/strength building) and balance exercises twice per week: or the following link for ideas: http://castillo-powell.com/  BuyDucts.dk  STRESS MANAGEMENT: -can try meditating, or just sitting quietly with deep breathing while intentionally relaxing all parts of your body for 5 minutes daily -if you need further help with stress, anxiety or depression please follow up with your primary doctor or contact the wonderful folks at [facility]: [phone]  SOCIAL CONNECTIONS: -options in [place] if you wish to engage in more social and exercise related activities:  -Silver sneakers https://tools.silversneakers.com  -Walk with a Doc: http://www.duncan-williams.com/  -Check out the [facility] Active Adults 50+ section on the [place] [employer] (hiking clubs, book clubs, cards and games, chess, exercise classes, aquatic classes and much more) - see the website for details: https://www.[place].gov/departments/parks-recreation/active-adults50  -YouTube has lots of exercise videos for different ages and  abilities as well  -[facility] (a variety of indoor and outdoor inperson activities for adults). [phone]. [address]  -Virtual Online Classes (a variety of topics): see seniorplanet.org or call 6465434849  -consider volunteering at a school, hospice center, church, senior center or elsewhere

## 2024-04-02 ENCOUNTER — Other Ambulatory Visit: Payer: Self-pay | Admitting: Family Medicine

## 2024-04-02 DIAGNOSIS — E039 Hypothyroidism, unspecified: Secondary | ICD-10-CM

## 2024-04-02 MED ORDER — THYROID 60 MG PO TABS
60.0000 mg | ORAL_TABLET | Freq: Every day | ORAL | 1 refills | Status: DC
Start: 2024-04-02 — End: 2024-04-13

## 2024-04-13 ENCOUNTER — Other Ambulatory Visit (HOSPITAL_COMMUNITY)
Admission: RE | Admit: 2024-04-13 | Discharge: 2024-04-13 | Disposition: A | Discharge status: Home / Self Care | Source: Ambulatory Visit | Attending: Family Medicine | Admitting: Family Medicine

## 2024-04-13 ENCOUNTER — Ambulatory Visit (INDEPENDENT_AMBULATORY_CARE_PROVIDER_SITE_OTHER): Admitting: Family Medicine

## 2024-04-13 ENCOUNTER — Encounter: Payer: Self-pay | Admitting: Family Medicine

## 2024-04-13 VITALS — BP 120/80 | HR 52 | Temp 98.0°F | Resp 16 | Ht 64.0 in | Wt 250.4 lb

## 2024-04-13 DIAGNOSIS — Z124 Encounter for screening for malignant neoplasm of cervix: Secondary | ICD-10-CM

## 2024-04-13 DIAGNOSIS — Z6841 Body Mass Index (BMI) 40.0 and over, adult: Secondary | ICD-10-CM

## 2024-04-13 DIAGNOSIS — I10 Essential (primary) hypertension: Secondary | ICD-10-CM

## 2024-04-13 DIAGNOSIS — R102 Pelvic and perineal pain unspecified side: Secondary | ICD-10-CM | POA: Diagnosis not present

## 2024-04-13 DIAGNOSIS — E039 Hypothyroidism, unspecified: Secondary | ICD-10-CM

## 2024-04-13 DIAGNOSIS — Z1151 Encounter for screening for human papillomavirus (HPV): Secondary | ICD-10-CM | POA: Insufficient documentation

## 2024-04-13 DIAGNOSIS — Z01419 Encounter for gynecological examination (general) (routine) without abnormal findings: Secondary | ICD-10-CM | POA: Diagnosis present

## 2024-04-13 DIAGNOSIS — R350 Frequency of micturition: Secondary | ICD-10-CM | POA: Diagnosis not present

## 2024-04-13 LAB — POCT URINALYSIS DIPSTICK
Bilirubin, UA: NEGATIVE
Blood, UA: NEGATIVE
Glucose, UA: NEGATIVE
Ketones, UA: NEGATIVE
Nitrite, UA: NEGATIVE
Protein, UA: NEGATIVE
Spec Grav, UA: 1.015 (ref 1.010–1.025)
Urobilinogen, UA: 0.2 U/dL
pH, UA: 7.5 (ref 5.0–8.0)

## 2024-04-13 NOTE — Patient Instructions (Addendum)
 A few things to remember from today's visit:  Urine frequency - Plan: POC Urinalysis Dipstick, Culture, Urine  Screening for cervical cancer - Plan: Cytology - PAP  Monitor blood pressure and let me know about readings in 2-3 weeks. TSH in 8-10 weeks. Stop biotin , if taking any, 5 days before lab.  If you need refills for medications you take chronically, please call your pharmacy. Do not use My Chart to request refills or for acute issues that need immediate attention. If you send a my chart message, it may take a few days to be addressed, specially if I am not in the office.  Please be sure medication list is accurate. If a new problem present, please set up appointment sooner than planned today.

## 2024-04-13 NOTE — Assessment & Plan Note (Signed)
 States that she was started on thyroid  hormone replacement to help with weight loss rather than for hypothyroidism. Last TSH was 0.4 in 11/2023. She does not feel like medication is helping with weight loss, she would like to discontinue. TSH in 8 to 10 weeks.

## 2024-04-13 NOTE — Progress Notes (Signed)
 Chief Complaint  Patient presents with   Gynecologic Exam   Medical Management of Chronic Issues    Pt would like to discuss if can get off of lisinopril  and thyroid .    Urinary Frequency   Discussed the use of AI scribe software for clinical note transcription with the patient, who gave verbal consent to proceed. History of Present Illness Ann Castillo is a 63 year old female who presents for a Pap smear after a six-year interval and follow up. She was last seen on 01/22/24 for hospital follow up. Since her last visit she has been evaluated in UC after a fall, 02/18/24. She has no concerns in this regard.  She has no history of abnormal Pap smears and has not been sexually active for approximately twenty years. She denies any vaginal bleeding or discharge and has no risk factors for sexually transmitted diseases. Mammogram on 01/14/24 Bi rads 1. M:10 LMP at age 41. G6 A1 miscarriages 2 and L3.  She experiences cramping sensations similar to menstrual cramps, occurring monthly for about a year, but without any bleeding. She also reports urinary frequency without burning, which has been present for the last three weeks. Negative for gross hematuria.  Immunization History  Administered Date(s) Administered   Influenza Split 03/20/2011   Influenza Whole 05/05/2009   Influenza, Mdck, Trivalent,PF 6+ MOS(egg free) 02/03/2024   Influenza, Seasonal, Injecte, Preservative Fre 02/25/2023   MMR 08/12/2023, 11/23/2023   Moderna Covid-19 Fall Seasonal Vaccine 30yrs & older 02/25/2023, 02/05/2024   PFIZER(Purple Top)SARS-COV-2 Vaccination 08/13/2019, 09/07/2019   PNEUMOCOCCAL CONJUGATE-20 08/12/2023   Respiratory Syncytial Virus Vaccine,Recomb Aduvanted(Arexvy) 04/26/2022   Td 06/16/2009   Tdap 10/19/2015, 02/25/2023   Zoster Recombinant(Shingrix) 04/15/2019, 06/14/2019   Health Maintenance  Topic Date Due   Cervical Cancer Screening (HPV/Pap Cotest)  04/01/2023   Medicare Annual  Wellness (AWV)  03/10/2025   Mammogram  01/13/2026   Colonoscopy  01/07/2031   DTaP/Tdap/Td (4 - Td or Tdap) 02/24/2033   Pneumococcal Vaccine: 50+ Years  Completed   Influenza Vaccine  Completed   COVID-19 Vaccine  Completed   Hepatitis C Screening  Completed   HIV Screening  Completed   Zoster Vaccines- Shingrix  Completed   Hepatitis B Vaccines 19-59 Average Risk  Aged Out   HPV VACCINES  Aged Out   Meningococcal B Vaccine  Aged Out   Currently on Armor thyroid  60 mg daily, initially prescribed byintegrative health specialist. She states that she has no hx of hypothyroidism and she was placed on medication to help with wt loss. She has not noted any intended benefit from medication, so would like to discontinue medication. She was also on Semaglutide for weight loss, which she discontinued due to cost and hitting a weight loss plateau. She lost sixty pounds while on the medication and noted a reduction in cravings. She is inquiring about starting medication. She is trying to walk a few times per week and cooks her meals at home.  Lab Results  Component Value Date   TSH 0.44 12/09/2023   HTN: She would like to stop Lisinopril -hydrochlorothiazide  10-12.5 mg, states that it was prescribed for LE edema, she reports hx of hypotension, is not reporting hypotensive episodes at home. Negative for severe/frequent headache, visual changes, chest pain, dyspnea, focal weakness, or worsening edema.  Lab Results  Component Value Date   NA 141 01/11/2024   CL 105 01/11/2024   K 4.4 01/11/2024   CO2 24 01/11/2024   BUN 21  01/11/2024   CREATININE 1.11 (H) 01/11/2024   GFRNONAA 57 (L) 01/11/2024   CALCIUM 10.3 01/11/2024   ALBUMIN 4.1 01/11/2024   GLUCOSE 142 (H) 01/11/2024   Review of Systems  Constitutional:  Negative for activity change, appetite change, chills and fever.  HENT:  Negative for sore throat.   Respiratory:  Negative for cough and wheezing.   Gastrointestinal:  Negative for  abdominal pain, nausea and vomiting.  Endocrine: Negative for cold intolerance and heat intolerance.  Genitourinary:  Negative for decreased urine volume, difficulty urinating and flank pain.  Skin:  Negative for rash.  Neurological:  Negative for syncope and facial asymmetry.  Psychiatric/Behavioral:  Negative for confusion.    Current Outpatient Medications on File Prior to Visit  Medication Sig Dispense Refill   Cholecalciferol (VITAMIN D ) 125 MCG (5000 UT) CAPS Take 1 capsule by mouth daily.     Coenzyme Q10 (COQ10) 100 MG CAPS Take 1 capsule by mouth daily.     CREATINE PO Take by mouth daily.     Cyanocobalamin (B-12 PO) Take by mouth daily.     escitalopram  (LEXAPRO ) 20 MG tablet TAKE 2 TABLETS BY MOUTH DAILY 180 tablet 3   Evolocumab  (REPATHA  SURECLICK) 140 MG/ML SOAJ INJECT 1 PEN INTO THE SKIN EVERY 14 DAYS 6 mL 3   gabapentin  (NEURONTIN ) 100 MG capsule TAKE 1 CAPSULE BY MOUTH EVERY MORNING AND 1 CAPSULE IN THE AFTERNOON AS NEEDED FOR ANXIETY 60 capsule 3   gabapentin  (NEURONTIN ) 300 MG capsule TAKE 1 CAPSULE BY MOUTH EVERY EVENING 90 capsule 0   HYDROcodone -acetaminophen  (NORCO/VICODIN) 5-325 MG tablet Take 1 tablet by mouth every 6 (six) hours as needed for severe pain (pain score 7-10). 15 tablet 0   magnesium gluconate (MAGONATE) 500 MG tablet Take 500 mg by mouth daily.     meloxicam (MOBIC) 15 MG tablet Take 15 mg by mouth daily as needed for pain.      Menaquinone-7 (K2 PO) Take by mouth daily.     Milk Thistle 150 MG CAPS Take 150 mg by mouth daily.     NEXLETOL  180 MG TABS TAKE 1 TABLET BY MOUTH EVERY DAY 90 tablet 3   Nutritional Supplements (JOINT FORMULA PO) Take 1 tablet by mouth daily.     ondansetron  (ZOFRAN -ODT) 4 MG disintegrating tablet Take 1 tablet (4 mg total) by mouth every 8 (eight) hours as needed for nausea or vomiting. 10 tablet 0   TURMERIC CURCUMIN PO Take by mouth daily.     UNABLE TO FIND 10 mg as needed. Med Name: CBD oil     UNABLE TO FIND Med Name:  Bount[place] Beets 500 mg daily.     SEMAGLUTIDE PO Take 48 mg by mouth once a week. (Patient not taking: Reported on 04/13/2024)     No current facility-administered medications on file prior to visit.    Past Medical History:  Diagnosis Date   Allergy    Anemia, unspecified    Anxiety    Carotid stenosis, right    1-39% by doppler 10/2019   Cavus deformity of foot, acquired    Depression    Dysthymic disorder    Edema    bilateral   Esophageal reflux    Essential hypertension, benign    Hx pulmonary embolism 2006   was on estrogen    Insomnia, unspecified    Kidney stones    Lyme disease    Major depressive disorder, single episode, unspecified    Migraine, unspecified, without mention  of intractable migraine without mention of status migrainosus    Myalgia and myositis, unspecified    Obesity, unspecified    Other and unspecified hyperlipidemia    Polycystic ovaries    Seizure (HCC)    Seizures (HCC)    Sleep apnea    Thyroid  nodule    2025 discovered   Tremors of nervous system    Unspecified gastritis and gastroduodenitis without mention of hemorrhage     Past Surgical History:  Procedure Laterality Date   COLONOSCOPY     no surgical history      Allergies  Allergen Reactions   Celecoxib Anaphylaxis    REACTION: ? anaphylactic reaction 10/11 - throat closing, hand itching 1 1/2 hrs after dose but had taken this previously without difficulty. REACTION: ? anaphylactic reaction 10/11 - throat closing, hand itching 1 1/2 hrs after dose but had taken this previously without difficulty.   Pravastatin Sodium Other (See Comments)    REACTION: upper extremity muscle aches - ? anaphylactic reaction 3 days later 03/07/08   Codeine Other (See Comments)    REACTION: questionable: mental status changes   Codeine Phosphate Other (See Comments)    REACTION: questionable: mental status changes   Dilaudid [Hydromorphone Hcl] Other (See Comments)    Pain  Increases.  blindness    Estrogen Other (See Comments)    hives   Estrogenic Substance Other (See Comments)    Blood clots   Hydromorphone Hcl Other (See Comments)    Pain  Increases.  blindness   Latex Rash   Other Hives   Pravastatin Sodium Other (See Comments)    REACTION: upper extremity muscle aches - ? anaphylactic reaction 3 days later 03/07/08   Rosuvastatin Other (See Comments)    REACTION: myalgia REACTION: myalgia   Zetia  [Ezetimibe ] Other (See Comments)    Stomach cramping, diarrhea    Family History  Problem Relation Age of Onset   Miscarriages / Stillbirths Mother    Alzheimer's disease Mother    Diabetes Father    Heart disease Father    Liver cancer Father    Pancreatic cancer Father    Gallbladder disease Father    Heart attack Father        x5, first @ 46   Breast cancer Maternal Aunt    Alzheimer's disease Maternal Uncle    Breast cancer Maternal Grandmother    Alzheimer's disease Paternal Grandfather    Learning disabilities Daughter        24 y.o mental capacity   ADD / ADHD Daughter    Personality disorder Daughter    ADD / ADHD Daughter    Hypertension Son    Tourette syndrome Son    Colon cancer Neg Hx    Colon polyps Neg Hx    Esophageal cancer Neg Hx    Stomach cancer Neg Hx    Rectal cancer Neg Hx     Social History   Socioeconomic History   Marital status: Married    Spouse name: Not on file   Number of children: 3   Years of education: College   Highest education level: Not on file  Occupational History   Occupation: [employer]    Employer: [employer]  Tobacco Use   Smoking status: Never   Smokeless tobacco: Never  Vaping Use   Vaping status: Never Used  Substance and Sexual Activity   Alcohol use: Yes    Alcohol/week: 0.0 - 1.0 standard drinks of alcohol   Drug use:  No   Sexual activity: Not Currently    Birth control/protection: Post-menopausal    Comment: 1st intercourse- 82, partners- 1, married- 38 yr s  Other Topics Concern    Not on file  Social History Narrative   Married, works as a it consultant.  She has significant stress from daughter, who is mentally ill and lives at home (age 61).     Caffeine: very rarely    Social Drivers of Corporate Investment Banker Strain: Not at Risk (09/10/2022)   Received from [employer]    Financial Resource Strain: 1  Food Insecurity: Not at Risk (09/10/2022)   Received from [employer]    Food: 1  Transportation Needs: Not at Risk (09/10/2022)   Received from [employer] [facility]eds    Transportation: 1  Physical Activity: Not at Risk (09/10/2022)   Received from [facility]   Physical Activity    Physical Activity: 1  Stress: Not at Risk (09/10/2022)   [facility]   Stress    Stress: 1  Social Connections: Not at Risk (09/10/2022)   Received from [employer]   Social Connections    Connectedness: 1   Vitals:   04/13/24 1141  BP: 120/80  Pulse: (!) 52  Resp: 16  Temp: 98 F (36.7 C)  SpO2: 98%   Body mass index is 42.98 kg/m.  Wt Readings from Last 3 Encounters:  04/13/24 250 lb 6.4 oz (113.6 kg)  01/22/24 242 lb 8 oz (110 kg)  01/11/24 240 lb (108.9 kg)    Physical Exam Vitals and nursing note reviewed. Exam conducted with a chaperone present.  Constitutional:      General: She is not in acute distress.    Appearance: She is well-developed.  HENT:     Head: Normocephalic and atraumatic.  Eyes:     Conjunctiva/sclera: Conjunctivae normal.  Cardiovascular:     Rate and Rhythm: Regular rhythm. Bradycardia present.     Heart sounds: No murmur heard. Pulmonary:     Effort: Pulmonary effort is normal. No respiratory distress.     Breath sounds: Normal breath sounds.  Abdominal:     Palpations: Abdomen is soft. There is no mass.     Tenderness: There is no abdominal tenderness. There is no right CVA tenderness or left CVA tenderness.  Genitourinary:    Exam position: Lithotomy position.     Labia:        Right:  No rash, tenderness, lesion or injury.        Left: No rash, tenderness, lesion or injury.      Vagina: Prolapsed vaginal walls (Minimal anterior cystocele.) present. No vaginal discharge, erythema, tenderness, bleeding or lesions.     Uterus: Not deviated, not enlarged and not tender.      Adnexa:        Right: No mass, tenderness or fullness.         Left: No mass, tenderness or fullness.    Lymphadenopathy:     Lower Body: No right inguinal adenopathy. No left inguinal adenopathy.  Skin:    General: Skin is warm.     Findings: No erythema or rash.  Neurological:     General: No focal deficit present.     Mental Status: She is alert and oriented to person, place, and time.     Gait: Gait normal.  Psychiatric:        Mood and Affect: Mood and affect normal.  ASSESSMENT AND PLAN:  Ms. Tahiri K Shimmin was here today for pap smear and follow up.  Orders Placed This Encounter  Procedures   Culture, Urine   US  PELVIC COMPLETE WITH TRANSVAGINAL   TSH   POC Urinalysis Dipstick   Pelvic pain Problem has been going on for a year. Pelvic examination difficult to body habits but I do not appreciate enlarged uterus or adnexal masses. Pelvic/transvaginal US  will be arranged.  -     US  PELVIC COMPLETE WITH TRANSVAGINAL; Future  Urine frequency Possible etiologies discussed. ? Overactive bladder. Urine dipstick today negative except for 1+ leukocytes. Will follow Ucx.  -     POCT urinalysis dipstick -     Urine Culture  Essential hypertension, benign Assessment & Plan: BP is adequately controlled. She would like to discontinue lisinopril -HCTZ 10-12.5 mg, which she is taking daily and according to patient it was recommended to help with lower extremity edema. States that she has history of hypotension. We discussed possible complications of hypertension. She will stop lisinopril -HCTZ as requested, recommend monitoring BP at home and to let me know about BP readings in about  2 to 3 weeks, before if needed. Continue low-salt diet.  Hypothyroidism (acquired) Assessment & Plan: States that she was started on thyroid  hormone replacement to help with weight loss rather than for hypothyroidism. Last TSH was 0.4 in 11/2023. She does not feel like medication is helping with weight loss, she would like to discontinue. TSH in 8 to 10 weeks.  Orders: -     TSH; Future  Morbid obesity with BMI of 40.0-44.9, adult [facility]) Assessment & Plan: She has gained some wt since her last visit in 12/2023. Consistency with healthy diet and physical activity encouraged. She is no longer on semaglutide she did not feel like it was longer helping.  She would like to resume pharmacologic treatment, so recommend following with prescriber, integrative medicine.  Screening for cervical cancer -     Cytology - PAP  Return if symptoms worsen or fail to improve, for keep next appointment.  Simora Dingee G. Annie Saephan, MD  [facility]. [place] office.

## 2024-04-13 NOTE — Assessment & Plan Note (Signed)
 She would like to discontinue lisinopril -HCTZ 10-12.5 mg, which she is taking daily and according to patient it was recommended to help with lower extremity edema. States that she has history of hypotension. We discussed possible complications of hypertension. She will stop lisinopril -HCTZ as requested, recommend monitoring BP at home and to let me know about BP readings in about 2 to 3 weeks, before if needed. Continue low-salt diet.

## 2024-04-13 NOTE — Assessment & Plan Note (Addendum)
 She has gained some wt since her last visit in 12/2023. Consistency with healthy diet and physical activity encouraged. She is no longer on semaglutide she did not feel like it was longer helping.  She would like to resume pharmacologic treatment, so recommend following with prescriber, integrative medicine.

## 2024-04-14 LAB — URINE CULTURE
MICRO NUMBER:: 17243866
Result:: NO GROWTH
SPECIMEN QUALITY:: ADEQUATE

## 2024-04-15 ENCOUNTER — Encounter: Payer: Self-pay | Admitting: Family Medicine

## 2024-04-15 LAB — CYTOLOGY - PAP
Comment: NEGATIVE
Diagnosis: NEGATIVE
High risk HPV: NEGATIVE

## 2024-04-16 ENCOUNTER — Encounter: Payer: Self-pay | Admitting: Family Medicine

## 2024-04-16 ENCOUNTER — Ambulatory Visit: Payer: Self-pay

## 2024-04-16 ENCOUNTER — Ambulatory Visit: Admitting: Family Medicine

## 2024-04-16 VITALS — BP 148/64 | HR 69 | Temp 97.9°F | Wt 253.0 lb

## 2024-04-16 DIAGNOSIS — N3289 Other specified disorders of bladder: Secondary | ICD-10-CM | POA: Diagnosis not present

## 2024-04-16 DIAGNOSIS — R3129 Other microscopic hematuria: Secondary | ICD-10-CM | POA: Diagnosis not present

## 2024-04-16 DIAGNOSIS — R339 Retention of urine, unspecified: Secondary | ICD-10-CM | POA: Diagnosis not present

## 2024-04-16 DIAGNOSIS — N8110 Cystocele, unspecified: Secondary | ICD-10-CM | POA: Diagnosis not present

## 2024-04-16 LAB — POC URINALSYSI DIPSTICK (AUTOMATED)
Bilirubin, UA: NEGATIVE
Blood, UA: POSITIVE — AB
Glucose, UA: NEGATIVE
Ketones, UA: NEGATIVE
Nitrite, UA: NEGATIVE
Protein, UA: NEGATIVE
Spec Grav, UA: 1.01 (ref 1.010–1.025)
Urobilinogen, UA: 0.2 U/dL
pH, UA: 7 (ref 5.0–8.0)

## 2024-04-16 NOTE — Progress Notes (Signed)
 Established Patient Office Visit   Subjective  Patient ID: Ann Castillo, female    DOB: 1960/05/29  Age: 63 y.o. MRN: [id number]  Chief Complaint  Patient presents with   Acute Visit    Patient came in today for urine retention, started 10 days ago with urgency but not emptying bladder, pain started today at 5am.  Patient saw Dr [name] on Monday.    Pt is a 63 yo female followed by Dr. [name] and seen for acute concern.  She woke up at 5:00 AM with severe abdominal pain and a strong urge to urinate, but was unable to pass much urine. The sensation of needing to urinate persisted after voiding a small amount. The pain is described as similar to severe menstrual cramps, radiating to the lower back.  She took Toradol  at 8:00 AM and 1:00 PM for pain relief. She has a history of kidney stones, with the most recent episode in August, but states that the current pain does not feel the same as previous kidney stone episodes.  She drinks approximately 70 ounces of water daily, along with herbal tea and almond milk. She avoids caffeine and spicy foods. She experienced mild nausea in the morning, which resolved with movement, and felt cold but not feverish. No constipation, bloating, or significant gas, although she felt gassy after a recent Pap smear.  She had a Pap smear and urinalysis on Monday, which showed slight abnormalities. The patient reports no blood in her urine since the Pap smear, except on the day of the procedure. She describes the pain as a pressure in the lower abdomen, similar to menstrual cramps, but not localized to the ovaries.  She has had three vaginal deliveries, with the largest baby weighing 7 pounds 6 ounces. Her blood pressure was 148/64 today, whereas it is usually around 117/60.    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 07/03/2014   Facial pain 11/19/2012   Dyspnea 09/19/2011   Allergic rhinitis 08/20/2011   Chest pain on exertion 04/17/2011   Diarrhea 03/01/2011   Abnormal facial hair 01/01/2011   Chronic cough 10/31/2009   DEPRESSION/ANXIETY 08/11/2009   Essential hypertension, benign 08/11/2009   INSOMNIA 05/05/2009   CAVUS DEFORMITY OF FOOT, ACQUIRED 09/30/2008   Morbid obesity with BMI of 40.0-44.9, adult (HCC) 07/08/2008   UNSPECIFIED ANEMIA 07/08/2008   LEG EDEMA, BILATERAL 07/08/2008   ADVERSE DRUG REACTION 03/08/2008   GERD 05/01/2007   HLD (hyperlipidemia) 07/25/2006   Migraine headache 07/25/2006   Pulmonary embolism and infarction (HCC) 07/25/2006   Past Medical History:  Diagnosis Date   Allergy    Anemia, unspecified    Anxiety    Carotid stenosis, right    1-39% by doppler 10/2019   Cavus deformity of foot, acquired    Depression    Dysthymic disorder    Edema    bilateral   Esophageal reflux    Essential hypertension, benign    Hx pulmonary embolism 2006   was on estrogen  Insomnia, unspecified    Kidney stones    Lyme disease    Major depressive disorder, single episode, unspecified    Migraine, unspecified, without mention of intractable migraine without mention of status migrainosus    Myalgia and myositis, unspecified    Obesity, unspecified    Other and unspecified hyperlipidemia    Polycystic ovaries    Seizure (HCC)    Seizures (HCC)    Sleep apnea    Thyroid  nodule    2025 discovered   Tremors of nervous system    Unspecified  gastritis and gastroduodenitis without mention of hemorrhage    Past Surgical History:  Procedure Laterality Date   COLONOSCOPY     no surgical history     Social History   Tobacco Use   Smoking status: Never   Smokeless tobacco: Never  Vaping Use   Vaping status: Never Used  Substance Use Topics   Alcohol use: Yes    Alcohol/week: 0.0 - 1.0 standard drinks of alcohol   Drug use: No   Family History  Problem Relation Age of Onset   Miscarriages / Stillbirths Mother    Alzheimer's disease Mother    Diabetes Father    Heart disease Father    Liver cancer Father    Pancreatic cancer Father    Gallbladder disease Father    Heart attack Father        x5, first @ 9   Breast cancer Maternal Aunt    Alzheimer's disease Maternal Uncle    Breast cancer Maternal Grandmother    Alzheimer's disease Paternal Grandfather    Learning disabilities Daughter        21 y.o mental capacity   ADD / ADHD Daughter    Personality disorder Daughter    ADD / ADHD Daughter    Hypertension Son    Tourette syndrome Son    Colon cancer Neg Hx    Colon polyps Neg Hx    Esophageal cancer Neg Hx    Stomach cancer Neg Hx    Rectal cancer Neg Hx    Allergies  Allergen Reactions   Celecoxib Anaphylaxis    REACTION: ? anaphylactic reaction 10/11 - throat closing, hand itching 1 1/2 hrs after dose but had taken this previously without difficulty. REACTION: ? anaphylactic reaction 10/11 - throat closing, hand itching 1 1/2 hrs after dose but had taken this previously without difficulty.   Pravastatin Sodium Other (See Comments)    REACTION: upper extremity muscle aches - ? anaphylactic reaction 3 days later 03/07/08   Codeine Other (See Comments)    REACTION: questionable: mental status changes   Codeine Phosphate Other (See Comments)    REACTION: questionable: mental status changes   Dilaudid [Hydromorphone Hcl] Other (See Comments)    Pain  Increases.  blindness   Estrogen Other (See Comments)     hives   Estrogenic Substance Other (See Comments)    Blood clots   Hydromorphone Hcl Other (See Comments)    Pain  Increases.  blindness   Latex Rash   Other Hives   Pravastatin Sodium Other (See Comments)    REACTION: upper extremity muscle aches - ? anaphylactic reaction 3 days later 03/07/08   Rosuvastatin Other (See Comments)    REACTION: myalgia REACTION: myalgia   Zetia  [Ezetimibe ] Other (See Comments)    Stomach cramping, diarrhea    ROS Negative unless stated above    Objective:     BP (!) 148/64 (BP Location: Right Wrist, Patient Position:  Sitting, Cuff Size: Normal)   Pulse 69   Temp 97.9 F (36.6 C) (Oral)   Wt 253 lb (114.8 kg)   LMP 12/26/2012   SpO2 100%   BMI 43.43 kg/m  BP Readings from Last 3 Encounters:  04/16/24 (!) 148/64  04/13/24 120/80  01/22/24 110/70   Wt Readings from Last 3 Encounters:  04/16/24 253 lb (114.8 kg)  04/13/24 250 lb 6.4 oz (113.6 kg)  01/22/24 242 lb 8 oz (110 kg)      Physical Exam Constitutional:      General: She is not in acute distress.    Appearance: Normal appearance.  HENT:     Head: Normocephalic and atraumatic.     Nose: Nose normal.     Mouth/Throat:     Mouth: Mucous membranes are moist.  Cardiovascular:     Rate and Rhythm: Normal rate and regular rhythm.     Heart sounds: Normal heart sounds. No murmur heard.    No gallop.  Pulmonary:     Effort: Pulmonary effort is normal. No respiratory distress.     Breath sounds: Normal breath sounds. No wheezing, rhonchi or rales.  Abdominal:     General: Bowel sounds are normal.     Palpations: Abdomen is soft.     Tenderness: There is no abdominal tenderness. There is no right CVA tenderness, left CVA tenderness or guarding.  Skin:    General: Skin is warm and dry.  Neurological:     Mental Status: She is alert and oriented to person, place, and time.        03/10/2024    6:32 PM 02/05/2023   10:12 AM 08/20/2011    2:09 PM  Depression screen PHQ  2/9  Decreased Interest 0 0 0  Down, Depressed, Hopeless 0 1 0  PHQ - 2 Score 0 1 0   Altered sleeping  1   Tired, decreased energy  1   Change in appetite  0   Feeling bad or failure about yourself   1   Trouble concentrating  2   Moving slowly or fidgety/restless  0   Suicidal thoughts  0   PHQ-9 Score  6    Difficult doing work/chores  Not difficult at all      Data saved with a previous flowsheet row definition      02/05/2023   10:12 AM  GAD 7 : Generalized Anxiety Score  Nervous, Anxious, on Edge 1  Control/stop worrying 0  Worry too much - different things 1  Trouble relaxing 0  Restless 0  Easily annoyed or irritable 0  Afraid - awful might happen 0  Total GAD 7 Score 2  Anxiety Difficulty Not difficult at all     Results for orders placed or performed in visit on 04/16/24  POCT Urinalysis Dipstick (Automated)  Result Value Ref Range   Color, UA yellow    Clarity, UA clear    Glucose, UA Negative Negative   Bilirubin, UA neg    Ketones, UA neg    Spec Grav, UA 1.010 1.010 - 1.025   Blood, UA Positive (A)    pH, UA 7.0 5.0 - 8.0   Protein, UA Negative Negative   Urobilinogen, UA 0.2 0.2 or 1.0 E.U./dL   Nitrite, UA neg    Leukocytes, UA Small (1+) (A) Negative      Assessment & Plan:   Urine retention -     POCT Urinalysis Dipstick (Automated) -  Urine Microscopic; Future  Bladder spasms  Other microscopic hematuria -     Urine Microscopic; Future  Cystocele, unspecified  Other orders -     Urine Culture  Concerning for UTI.  Also considered renal calculi.  UA with leuks and RBCs.  Obtain urine culture and microscopic.  Cystocele reported on chart review during recent attempt at Pap.  Consider pelvic floor exercises,  pelvic floor physical therapy, or urology referral.  Further recommendations based on results.  Given strict precautions.  Return if symptoms worsen or fail to improve.   Clotilda JONELLE Single, MD

## 2024-04-16 NOTE — Telephone Encounter (Signed)
 Appt scheduled with Dr. Mercer today at 1530.

## 2024-04-16 NOTE — Telephone Encounter (Signed)
 FYI Only or Action Required?: Action required by provider: update on patient condition.  Patient was last seen in primary care on 04/13/2024 by Ann Castillo, [name], MD.  Called Nurse Triage reporting Urinary Retention.  Symptoms began a week ago.  Interventions attempted: Prescription medications: Toradol .  Symptoms are: gradually worsening.  Triage Disposition: Go to ED Now (Notify PCP)  Patient/caregiver understands and will follow disposition?: No, wishes to speak with PCP          Copied from CRM #[id number]. Topic: Clinical - Red Word Triage >> Apr 16, 2024 10:38 AM Ann Castillo wrote: [facility] that prompted transfer to Nurse Triage: Pt states that she recently had a pap smear done and did a urinalysis that came back abnormal, pt woke up this morning experiencing a lot pain and being unable to urinate. Reason for Disposition  [1] Unable to urinate (or only a few drops) > 4 hours AND [2] bladder feels very full (e.Castillo., palpable bladder or strong urge to urinate)  Answer Assessment - Initial Assessment Questions This RN recommends pt goes to ED. Pt refuses. Pt call [phone]  Pt states she was scheduled for an internal ultrasound that she needs to reschedule bc she can't do that with her pain. She wants Dr. [name] to be aware.   Urinalysis that came back abnormal per pt (leukocytes were abnormal per pt but states she did not get any guidance) Pt states her recent pap smear was normal Pt states she is having difficulty with urination, only a dribble Onset: one week ago Feels like really bad cramps; 4/10 pain level Pt states she had a kidney stone back in Aug  Lower back pain started today Denies fever, vomiting  Protocols used: Urinary Symptoms-A-AH

## 2024-04-16 NOTE — Patient Instructions (Signed)
 If you would like to see a pelvic floor physical therapist of the Urologist Dr. [name] can place the referral for you based on insurance preference if any.

## 2024-04-17 ENCOUNTER — Ambulatory Visit: Payer: Self-pay | Admitting: Family Medicine

## 2024-04-17 DIAGNOSIS — N3001 Acute cystitis with hematuria: Secondary | ICD-10-CM

## 2024-04-17 LAB — URINALYSIS, MICROSCOPIC ONLY

## 2024-04-18 LAB — URINE CULTURE
MICRO NUMBER:: 17262692
SPECIMEN QUALITY:: ADEQUATE

## 2024-04-27 MED ORDER — AMOXICILLIN-POT CLAVULANATE 500-125 MG PO TABS: 1.0000 | ORAL_TABLET | Freq: Two times a day (BID) | ORAL | 0 refills | Status: AC

## 2024-05-01 ENCOUNTER — Encounter: Payer: Self-pay | Admitting: Physician Assistant

## 2024-05-01 ENCOUNTER — Ambulatory Visit: Admitting: Physician Assistant

## 2024-05-01 DIAGNOSIS — F4323 Adjustment disorder with mixed anxiety and depressed mood: Secondary | ICD-10-CM

## 2024-05-01 DIAGNOSIS — G4700 Insomnia, unspecified: Secondary | ICD-10-CM

## 2024-05-01 NOTE — Progress Notes (Signed)
 [facility] Med Check  Patient ID: Ann Castillo,  MRN: [id number]  PCP: [name], Betty G, MD  Date of Evaluation: 05/01/2024 Time spent:25 minutes  Chief Complaint:  Chief Complaint   Depression; Anxiety; Follow-up    HISTORY/CURRENT STATUS: HPI Routine Med check  Feels like her meds are working well.  She is able to enjoy things.  Energy and motivation are good most of the time. 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 No extreme sadness, tearfulness, or feelings of hopelessness.  Sleeps well most of the time.  ADLs and personal hygiene are normal.   Denies any changes in concentration, making decisions, or remembering things.  Appetite has not changed.  Weight is stable.  Is going to the gym again, working on the recumbent bike and doing yoga.  Still has situational anxiety sometimes but she's good at recognizing when it's starting to happen and is able to try and prevent it. No mania, delirium, AH/VH.  No SI/HI.  Individual Medical History/ Review of Systems: Changes? :No      Past medications for mental health diagnoses include: Lexapro , trazodone , loxapine, Ambien, Klonopin, melatonin   Allergies: Celecoxib, Pravastatin sodium, Codeine, Codeine phosphate, Dilaudid [hydromorphone hcl], Estrogen, Estrogenic substance, Hydromorphone hcl, Latex, Other, Pravastatin sodium, Rosuvastatin, and Zetia  [ezetimibe ]  Current Medications:  Current Outpatient Medications:    amoxicillin -clavulanate (AUGMENTIN ) 500-125 MG tablet, Take 1 tablet by mouth in the morning and at bedtime for 7 days., Disp: 14 tablet, Rfl: 0   Cholecalciferol (VITAMIN D ) 125 MCG (5000 UT) CAPS, Take 1 capsule by mouth daily., Disp: , Rfl:    Coenzyme Q10 (COQ10) 100 MG CAPS, Take 1 capsule by mouth daily., Disp: , Rfl:    CREATINE PO, Take by mouth daily., Disp: , Rfl:    Cyanocobalamin (B-12 PO), Take by mouth daily., Disp: , Rfl:    escitalopram  (LEXAPRO ) 20 MG tablet, TAKE 2 TABLETS BY MOUTH DAILY, Disp: 180  tablet, Rfl: 3   gabapentin  (NEURONTIN ) 100 MG capsule, TAKE 1 CAPSULE BY MOUTH EVERY MORNING AND 1 CAPSULE IN THE AFTERNOON AS NEEDED FOR ANXIETY, Disp: 60 capsule, Rfl: 3   gabapentin  (NEURONTIN ) 300 MG capsule, TAKE 1 CAPSULE BY MOUTH EVERY EVENING, Disp: 90 capsule, Rfl: 0   HYDROcodone -acetaminophen  (NORCO/VICODIN) 5-325 MG tablet, Take 1 tablet by mouth every 6 (six) hours as needed for severe pain (pain score 7-10)., Disp: 15 tablet, Rfl: 0   magnesium gluconate (MAGONATE) 500 MG tablet, Take 500 mg by mouth daily., Disp: , Rfl:    meloxicam (MOBIC) 15 MG tablet, Take 15 mg by mouth daily as needed for pain. , Disp: , Rfl:    Menaquinone-7 (K2 PO), Take by mouth daily., Disp: , Rfl:    Milk Thistle 150 MG CAPS, Take 150 mg by mouth daily., Disp: , Rfl:    NEXLETOL  180 MG TABS, TAKE 1 TABLET BY MOUTH EVERY DAY, Disp: 90 tablet, Rfl: 3   Nutritional Supplements (JOINT FORMULA PO), Take 1 tablet by mouth daily., Disp: , Rfl:    TURMERIC CURCUMIN PO, Take by mouth daily., Disp: , Rfl:    UNABLE TO FIND, 10 mg as needed. Med Name: CBD oil, Disp: , Rfl:    UNABLE TO FIND, Med Name: [place] Be[place] 500 mg daily., Disp: , Rfl:    UNABLE TO FIND, sulfapheraphane, Disp: , Rfl:    Evolocumab  (REPATHA  SURECLICK) 140 MG/ML SOAJ, INJECT 1 PEN INTO THE SKIN EVERY 14 DAYS (Patient not taking: Reported on 05/01/2024), Disp: 6 mL,  Rfl: 3   ondansetron  (ZOFRAN -ODT) 4 MG disintegrating tablet, Take 1 tablet (4 mg total) by mouth every 8 (eight) hours as needed for nausea or vomiting. (Patient not taking: Reported on 05/01/2024), Disp: 10 tablet, Rfl: 0   SEMAGLUTIDE PO, Take 48 mg by mouth once a week. (Patient not taking: Reported on 04/13/2024), Disp: , Rfl:  Medication Side Effects: none  Family Medical/ Social History: Changes?  No  MENTAL HEALTH EXAM:  Last menstrual period 12/26/2012.There is no height or weight on file to calculate BMI.  General Appearance: Casual, Well Groomed, and Obese  Eye  Contact:  Good  Speech:  Clear and Coherent and Normal Rate  Volume:  Normal  Mood:  Euthymic  Affect:  Congruent  Thought Process:  Goal Directed and Descriptions of Associations: Circumstantial  Orientation:  Full (Time, Place, and Person)  Thought Content: Logical   Suicidal Thoughts:  No  Homicidal Thoughts:  No  Memory:  WNL  Judgement:  Good  Insight:  Good  Psychomotor Activity:  Normal  Concentration:  Concentration: Good and Attention Span: Good  Recall:  Good  Fund of Knowledge: Good  Language: Good  Assets:  Communication Skills Desire for Improvement Financial Resources/Insurance Housing Resilience Transportation Vocational/Educational  ADL's:  Intact  Cognition: WNL  Prognosis:  Good   PCP follows labs.  DIAGNOSES:    ICD-10-CM   1. Situational mixed anxiety and depressive disorder  F43.23     2. Insomnia, unspecified type  G47.00       Receiving Psychotherapy: Yes  Marval Bunde, LCSW in the past  RECOMMENDATIONS:  PDMP reviewed.  Gabapentin  filled 04/19/2024. I provided approximately  20 minutes of face to face time during this encounter, including time spent before and after the visit in records review, medical decision making, counseling pertinent to today's visit, and charting.   She is doing well so no changes need to be made.  Continue Lexapro  20 mg, 2 po daily. Continue gabapentin  100 mg 2 po q am. And 300 mg, q hs.  Continue vitamins and supplements from her integrative doctor. Continue therapy with Marval Bunde, LCSW as needed.   Return in 6 months.  Verneita Cooks, PA-C

## 2024-05-08 ENCOUNTER — Ambulatory Visit (INDEPENDENT_AMBULATORY_CARE_PROVIDER_SITE_OTHER): Admitting: Physician Assistant

## 2024-06-02 ENCOUNTER — Other Ambulatory Visit: Payer: Self-pay | Admitting: Physician Assistant

## 2024-06-15 ENCOUNTER — Other Ambulatory Visit

## 2024-06-15 DIAGNOSIS — E039 Hypothyroidism, unspecified: Secondary | ICD-10-CM | POA: Diagnosis not present

## 2024-06-15 LAB — TSH: TSH: 1.97 u[IU]/mL (ref 0.35–5.50)

## 2024-07-01 NOTE — Progress Notes (Unsigned)
 " Cardiology Office Note:    Date:  07/02/2024   ID:  Ann Castillo, DOB 10/19/1960, MRN [id number]  PCP:  [name], Betty G, MD   [facility] Providers Cardiologist:  Wilbert Bihari, MD     Referring MD: [name], Betty G, MD       History of Present Illness:   Ann Castillo is a 64 y.o. female with a hx of sleep apnea, HTN, HLD, obesity, GERD, syncope, presenting today for annual follow-up of chronic cardiovascular conditions.  She was initially seen by Dr. Rolan for chest pain.  She had a negative Myoview  in March 2011.  Repeat ETT was normal 11/2011.    She returned for evaluation of hypertension and syncope 10/20/2019 and was seen by Dr. Bihari.  She reported recent frequent falls and feeling lightheaded before she falls.  Usually occurring when she is walking and will fall flat on her face. Carotid ultrasound done in 2019 revealed 1 to 39% carotid stenosis. She was advised to discontinue lisinopril  HCT and start lisinopril  10 mg daily.  TTE 11/11/2019 showed normal heart function with mildly enlarged RV and mildly thickened AV leaflets.  Carotid duplex 11/11/2019 revealed 1 to 39% stenosis RICA.  Cardiac monitor completed 12/02/2019 revealed sinus bradycardia during night hours, no pauses or arrhythmias, no atrial fibrillation.  Chest CTA normal.  Sleep study showed mild OSA with severe nocturnal hypoxemia and CPAP titration was recommended.  Seen by Dr. Bihari 10/26/2021 and advised to stop lisinopril  HCTZ, and take HCTZ 25 mg daily as needed for leg edema.  Most recently seen 12/31/2022 with cardiology by Rosaline Bane, NP, she was doing well at that time, she had stopped using her CPAP following a diagnosis of oral lichen planus.  Presents independently, doing well from a cardiovascular standpoint.  She is exercising regularly with a trainer, denies cardiovascular symptoms during workout. She denies chest pain, dyspnea, palpitations, n, v, dark/tarry/bloody stools, hematuria,  syncope, edema.  She does report some weight gain since stopping her semaglutide.  She does report occasional orthostatic hypotension that is managed with slow positional changes with occasional dizziness.  She has a history of difficulty breathing while lying flat.  She now has a bed that has an adjustable incline, no longer uses CPAP as stated above.  She was seen in the ED in [place], and mentioned there was an incidental finding of some coronary artery calcifications on a CT scan while there.  She also mentioned concerned over her LDL, as her dietitian mentioned that it being too low could cause issues with dementia later in life.   ROS:   Please see the history of present illness.    All other systems reviewed and are negative.     Past Medical History:  Diagnosis Date   Allergy    Anemia, unspecified    Anxiety    Carotid stenosis, right    1-39% by doppler 10/2019   Cavus deformity of foot, acquired    Depression    Dysthymic disorder    Edema    bilateral   Esophageal reflux    Essential hypertension, benign    Hx pulmonary embolism 2006   was on estrogen    Insomnia, unspecified    Kidney stones    Lyme disease    Major depressive disorder, single episode, unspecified    Migraine, unspecified, without mention of intractable migraine without mention of status migrainosus    Myalgia and myositis, unspecified    Obesity, unspecified  Other and unspecified hyperlipidemia    Polycystic ovaries    Seizure (HCC)    Seizures (HCC)    Sleep apnea    Thyroid  nodule    2025 discovered   Tremors of nervous system    Unspecified gastritis and gastroduodenitis without mention of hemorrhage     Past Surgical History:  Procedure Laterality Date   COLONOSCOPY     no surgical history      Current Medications: Active Medications[1]   Allergies:   Celecoxib, Pravastatin sodium, Codeine, Codeine phosphate, Dilaudid [hydromorphone hcl], Estrogen, Estrogenic substance,  Hydromorphone hcl, Latex, Other, Pravastatin sodium, Rosuvastatin, and Zetia  [ezetimibe ]   Social History   Socioeconomic History   Marital status: Married    Spouse name: Not on file   Number of children: 3   Years of education: College   Highest education level: Associate degree: academic program  Occupational History   Occupation: [employer]    Employer: [employer]  Tobacco Use   Smoking status: Never   Smokeless tobacco: Never  Vaping Use   Vaping status: Never Used  Substance and Sexual Activity   Alcohol use: Yes    Alcohol/week: 0.0 - 1.0 standard drinks of alcohol   Drug use: No   Sexual activity: Not Currently    Birth control/protection: Post-menopausal    Comment: 1st intercourse- 33, partners- 1, married- 38 yr s  Other Topics Concern   Not on file  Social History Narrative   Married, works as a it consultant.  She has significant stress from daughter, who is mentally ill and lives at home (age 28).     Caffeine: very rarely    Social Drivers of Health   Tobacco Use: Low Risk (07/02/2024)   Patient History    Smoking Tobacco Use: Never    Smokeless Tobacco Use: Never    Passive Exposure: Not on file  Financial Resource Strain: Low Risk (07/01/2024)   Overall Financial Resource Strain (CARDIA)    Difficulty of Paying Living Expenses: Not very hard  Food Insecurity: No Food Insecurity (07/01/2024)   Epic    Worried About Radiation Protection Practitioner of Food in the Last Year: Never true    Ran Out of Food in the Last Year: Never true  Transportation Needs: No Transportation Needs (07/01/2024)   Epic    Lack of Transportation (Medical): No    Lack of Transportation (Non-Medical): No  Physical Activity: Sufficiently Active (07/01/2024)   Exercise Vital Sign    Days of Exercise per Week: 7 days    Minutes of Exercise per Session: 60 min  Stress: Stress Concern Present (07/01/2024)   [employer] of Occupational Health - Occupational Stress Questionnaire    Feeling of Stress:  Rather much  Social Connections: Moderately Isolated (07/01/2024)   Social Connection and Isolation Panel    Frequency of Communication with Friends and Family: Three times a week    Frequency of Social Gatherings with Friends and Family: Three times a week    Attends Religious Services: Never    Active Member of Clubs or Organizations: No    Attends Banker Meetings: Not on file    Marital Status: Married  Depression (PHQ2-9): Low Risk (03/10/2024)   Depression (PHQ2-9)    PHQ-2 Score: 0  Alcohol Screen: Low Risk (07/01/2024)   Alcohol Screen    Last Alcohol Screening Score (AUDIT): 1  Housing: Low Risk (07/01/2024)   Epic    Unable to Pay for Housing in the Last Year: No  Number of Times Moved in the Last Year: 0    Homeless in the Last Year: No  Utilities: Not on file  Health Literacy: Not on file     Family History: The patient's family history includes ADD / ADHD in her daughter and daughter; Alzheimer's disease in her maternal uncle, mother, and paternal grandfather; Breast cancer in her maternal aunt and maternal grandmother; Diabetes in her father; Gallbladder disease in her father; Heart attack in her father; Heart disease in her father; Hypertension in her son; Learning disabilities in her daughter; Liver cancer in her father; Miscarriages / Stillbirths in her mother; Pancreatic cancer in her father; Personality disorder in her daughter; Tourette syndrome in her son. There is no history of Colon cancer, Colon polyps, Esophageal cancer, Stomach cancer, or Rectal cancer.  EKGs/Labs/Other Studies Reviewed:    The following studies were reviewed today:  EKG Interpretation Date/Time:  Thursday July 02 2024 09:47:47 EST Ventricular Rate:  67 PR Interval:  186 QRS Duration:  76 QT Interval:  378 QTC Calculation: 399 R Axis:   11  Text Interpretation: Normal sinus rhythm Normal ECG When compared with ECG of 31-Dec-2022 11:10, No significant change was found  Confirmed by Vermelle Cammarata [phone]) on 07/02/2024 10:02:34 AM    Recent Labs: 01/11/2024: ALT 18; BUN 21; Creatinine, Ser 1.11; Hemoglobin 13.6; Platelets 354; Potassium 4.4; Sodium 141 06/15/2024: TSH 1.97  Recent Lipid Panel    Component Value Date/Time   CHOL 129 04/29/2023 1042   TRIG 146 04/29/2023 1042   HDL 45 04/29/2023 1042   CHOLHDL 2.9 04/29/2023 1042   CHOLHDL 4.2 07/16/2011 1023   VLDL 28 07/16/2011 1023   LDLCALC 59 04/29/2023 1042   LDLDIRECT 181.4 02/14/2009 0000     Risk Assessment/Calculations:                Physical Exam:    VS:  BP 110/70   Pulse 66   Ht 5' 4 (1.626 m)   Wt 257 lb 6.4 oz (116.8 kg)   LMP 12/26/2012   SpO2 99%   BMI 44.18 kg/m        Wt Readings from Last 3 Encounters:  07/02/24 257 lb 6.4 oz (116.8 kg)  04/16/24 253 lb (114.8 kg)  04/13/24 250 lb 6.4 oz (113.6 kg)     GEN:  Morbidly obese, well developed in no acute distress HEENT: Normal NECK: No JVD; No carotid bruits CARDIAC:  S1-S2 normal, RRR, no murmurs, rubs, gallops RESPIRATORY:  Clear to auscultation without rales, wheezing or rhonchi  MUSCULOSKELETAL:  No edema; No deformity  SKIN: Warm and dry NEUROLOGIC:  Alert and oriented x 3 PSYCHIATRIC:  Normal affect       Assessment & Plan Coronary artery calcification CT chest with contrast 08/27/2023 performed at [facility] in [place] demonstrated coronary artery calcifications. Denies CP, SOB, palpitations, near syncope Will order cardiac calcium scoring for risk stratification Carotid stenosis, right Carotid duplex 10/2019 with 1-39% stenosis RICA  Will update study today given occasional symptoms of dizziness to rule out further progression of disease Pure hypercholesterolemia Patient is fasting, will update lipid panel today. Patient's dietitian reported concern of low LDL potentially leading to increased dementia risk later in life Patient concerned given her prominent family history of  Alzheimer's and dementia We briefly discussed balancing the risk of developing cardiovascular disease versus dementia risk based on cholesterol values. Ultimately, we decided that we will obtain a CAC score for further risk stratification and then discuss  with her primary cardiologist to determine an appropriate LDL goal going forward.  Official statement from the American Heart Association: Lipid-lowering therapies that achieve low LDL-C levels do not increase dementia risk. The American Heart Association states that randomized controlled trials of statins, ezetimibe , and PCSK9 inhibitors show no association between these treatments and cognitive impairment, even when LDL-C is reduced to very low levels (<25 mg/dL). Studies of evolocumab  and alirocumab found no increase in cognitive adverse events with up to 8.4 years of treatment. - Aggressive LDL-C Lowering and the Brain: Impact on Risk for Dementia and Hemorrhagic Stroke: A Scientific Statement From the American Heart Association. Arteriosclerosis, Thrombosis, and Vascular Biology. 2023. Elige LB, Toth PP, Dearborn-Tomazos JL, et al. Continue Repatha  140 mg [place] inj every 2 weeks, bempedoic acid  180 mg daily  OSA (obstructive sleep apnea) Obesity hypoventilation syndrome (HCC) Previously dx w/ sleep apnea. Denies use of CPAP d/t hx of oral lichen planus infection. Patient reports symptoms of sleep disordered breathing fairly well managed w/ new bed that allows for incline during sleep. History of orthostatic hypotension She reports her BP is well controlled off medications at this time. Will occasionally have episodes of orthostatic lightheadedness that she manages with slow positional changes and staying hydrated. Given recent mildly elevated creatinine from her baseline, will update BMP today to reassess.  Morbid obesity with BMI of 40.0-44.9, adult [facility]) She sees a dietician and works with a pharmacist, hospital. Will refer to weight management  clinic for additional weight loss resources.   Disposition: Follow up with Dr. Shlomo in one year, or sooner if needed. Proceed to ED w/ any new or worsening symptoms.            Medication Adjustments/Labs and Tests Ordered: Current medicines are reviewed at length with the patient today.  Concerns regarding medicines are outlined above.  Orders Placed This Encounter  Procedures   CT CARDIAC SCORING (SELF PAY ONLY)   Lipid panel   Basic metabolic panel with GFR   Amb Ref to Medical Weight Management   EKG 12-Lead   VAS US  CAROTID   Meds ordered this encounter  Medications   Evolocumab  (REPATHA  SURECLICK) 140 MG/ML SOAJ    Sig: INJECT 1 PEN INTO THE SKIN EVERY 14 DAYS    Dispense:  6 mL    Refill:  3   Bempedoic Acid  (NEXLETOL ) 180 MG TABS    Sig: Take 1 tablet (180 mg total) by mouth daily.    Dispense:  90 tablet    Refill:  3    NEED REFILL    Patient Instructions  Medication Instructions:  Your refills have been sent to your pharmacy.  A referral has been placed for you for weight management.  *If you need a refill on your cardiac medications before your next appointment, please call your pharmacy*  Lab Work: LIPID, BMET If you have labs (blood work) drawn today and your tests are completely normal, you will receive your results only by: MyChart Message (if you have MyChart) OR A paper copy in the mail If you have any lab test that is abnormal or we need to change your treatment, we will call you to review the results.  Testing/Procedures: Your physician has requested that you have a carotid duplex. This test is an ultrasound of the carotid arteries in your neck. It looks at blood flow through these arteries that supply the brain with blood. Allow one hour for this exam. There are no restrictions or special instructions.  CT coronary calcium score.   Test locations:   [facility] at [facility]   [facility] at [facility]  This is $99 out of pocket.   Coronary Calcium Scan   A coronary calcium scan is an imaging test used to look for deposits of calcium and other fatty materials (plaques) in the inner lining of the blood vessels of the heart (coronary arteries). These deposits of calcium and plaques can partly clog and narrow the coronary arteries without producing any symptoms or warning signs. This puts a person at risk for a heart attack. This test can detect these deposits before symptoms develop. Tell a health care provider about: Any allergies you have. All medicines you are taking, including vitamins, herbs, eye drops, creams, and over-the-counter medicines. Any problems you or family members have had with anesthetic medicines. Any blood disorders you have. Any surgeries you have had. Any medical conditions you have. Whether you are pregnant or may be pregnant. What are the risks? Generally, this is a safe procedure. However, problems may occur, including: Harm to a pregnant woman and her unborn baby. This test involves the use of radiation. Radiation exposure can be dangerous to a pregnant woman and her unborn baby. If you are pregnant, you generally should not have this procedure done. Slight increase in the risk of cancer. This is because of the radiation involved in the test. What happens before the procedure? No preparation is needed for this procedure. What happens during the procedure? You will undress and remove any jewelry around your neck or chest. You will put on a hospital gown. Sticky electrodes will be placed on your chest. The electrodes will be connected to an electrocardiogram (ECG) machine to record a tracing of the electrical activity of your heart. A CT scanner will take pictures of your heart. During this time, you will be asked to lie still and hold your breath for 2-3 seconds while a picture of your  heart is being taken. The procedure may vary among health care providers and hospitals. What happens after the procedure? You can get dressed. You can return to your normal activities. It is up to you to get the results of your test. Ask your health care provider, or the department that is doing the test, when your results will be ready. Summary A coronary calcium scan is an imaging test used to look for deposits of calcium and other fatty materials (plaques) in the inner lining of the blood vessels of the heart (coronary arteries). Generally, this is a safe procedure. Tell your health care provider if you are pregnant or may be pregnant. No preparation is needed for this procedure. A CT scanner will take pictures of your heart. You can return to your normal activities after the scan is done. This information is not intended to replace advice given to you by your health care provider. Make sure you discuss any questions you have with your health care provider. Document Released: 11/10/2007 Document Revised: 04/02/2016 Document Reviewed: 04/02/2016 Elsevier Interactive Patient Education  2017 [employer].   Follow-Up: At [facility], you and your health needs are our priority.  As part of our continuing mission to provide you with exceptional heart care, our providers are all part of one team.  This team includes your primary Cardiologist (physician) and Advanced Practice Providers or APPs (Physician Assistants and Nurse Practitioners) who all work together to provide you  with the care you need, when you need it.  Your next appointment:   1 year(s)  Provider:   Wilbert Bihari, MD    We recommend signing up for the patient portal called MyChart.  Sign up information is provided on this After Visit Summary.  MyChart is used to connect with patients for Virtual Visits (Telemedicine).  Patients are able to view lab/test results, encounter notes, upcoming appointments, etc.  Non-urgent  messages can be sent to your provider as well.   To learn more about what you can do with MyChart, go to [email or web address].   Other Instructions            Signed, Miriam FORBES Shams, NP  07/02/2024 10:46 AM    [facility]     [1]  Current Meds  Medication Sig   Cholecalciferol (VITAMIN D ) 125 MCG (5000 UT) CAPS Take 1 capsule by mouth daily.   Coenzyme Q10 (COQ10) 100 MG CAPS Take 1 capsule by mouth daily.   CREATINE PO Take by mouth daily.   Cyanocobalamin (B-12 PO) Take by mouth daily.   escitalopram  (LEXAPRO ) 20 MG tablet TAKE 2 TABLETS BY MOUTH DAILY   gabapentin  (NEURONTIN ) 100 MG capsule TAKE 1 CAPSULE BY MOUTH EVERY MORNING AND 1 CAPSULE IN THE AFTERNOON AS NEEDED FOR ANXIETY   gabapentin  (NEURONTIN ) 300 MG capsule TAKE 1 CAPSULE BY MOUTH EVERY EVENING   HYDROcodone -acetaminophen  (NORCO/VICODIN) 5-325 MG tablet Take 1 tablet by mouth every 6 (six) hours as needed for severe pain (pain score 7-10).   magnesium gluconate (MAGONATE) 500 MG tablet Take 500 mg by mouth daily.   meloxicam (MOBIC) 15 MG tablet Take 15 mg by mouth daily as needed for pain.    Menaquinone-7 (K2 PO) Take by mouth daily.   Milk Thistle 150 MG CAPS Take 150 mg by mouth daily.   Nutritional Supplements (JOINT FORMULA PO) Take 1 tablet by mouth daily.   ondansetron  (ZOFRAN -ODT) 4 MG disintegrating tablet Take 1 tablet (4 mg total) by mouth every 8 (eight) hours as needed for nausea or vomiting.   SEMAGLUTIDE PO Take 48 mg by mouth once a week.   TURMERIC CURCUMIN PO Take by mouth daily.   UNABLE TO FIND 10 mg as needed. Med Name: CBD oil   UNABLE TO FIND Med Name: [place] Beets 500 mg daily.   UNABLE TO FIND sulfapheraphane   [DISCONTINUED] Evolocumab  (REPATHA  SURECLICK) 140 MG/ML SOAJ INJECT 1 PEN INTO THE SKIN EVERY 14 DAYS   [DISCONTINUED] NEXLETOL  180 MG TABS TAKE 1 TABLET BY MOUTH EVERY DAY   "

## 2024-07-02 ENCOUNTER — Ambulatory Visit (HOSPITAL_COMMUNITY)
Admission: RE | Admit: 2024-07-02 | Discharge: 2024-07-02 | Disposition: A | Payer: Self-pay | Source: Ambulatory Visit | Attending: Emergency Medicine

## 2024-07-02 ENCOUNTER — Ambulatory Visit: Admitting: Emergency Medicine

## 2024-07-02 ENCOUNTER — Telehealth: Payer: Self-pay

## 2024-07-02 ENCOUNTER — Encounter: Payer: Self-pay | Admitting: Emergency Medicine

## 2024-07-02 ENCOUNTER — Encounter (INDEPENDENT_AMBULATORY_CARE_PROVIDER_SITE_OTHER): Payer: Self-pay

## 2024-07-02 VITALS — BP 110/70 | HR 66 | Ht 64.0 in | Wt 257.4 lb

## 2024-07-02 DIAGNOSIS — Z8679 Personal history of other diseases of the circulatory system: Secondary | ICD-10-CM | POA: Diagnosis not present

## 2024-07-02 DIAGNOSIS — I6521 Occlusion and stenosis of right carotid artery: Secondary | ICD-10-CM

## 2024-07-02 DIAGNOSIS — E662 Morbid (severe) obesity with alveolar hypoventilation: Secondary | ICD-10-CM | POA: Diagnosis not present

## 2024-07-02 DIAGNOSIS — I2510 Atherosclerotic heart disease of native coronary artery without angina pectoris: Secondary | ICD-10-CM | POA: Diagnosis not present

## 2024-07-02 DIAGNOSIS — Z6841 Body Mass Index (BMI) 40.0 and over, adult: Secondary | ICD-10-CM

## 2024-07-02 DIAGNOSIS — E7800 Pure hypercholesterolemia, unspecified: Secondary | ICD-10-CM | POA: Diagnosis not present

## 2024-07-02 DIAGNOSIS — G4733 Obstructive sleep apnea (adult) (pediatric): Secondary | ICD-10-CM

## 2024-07-02 MED ORDER — REPATHA SURECLICK 140 MG/ML ~~LOC~~ SOAJ: SUBCUTANEOUS | 3 refills | Status: AC

## 2024-07-02 MED ORDER — NEXLETOL 180 MG PO TABS: 1.0000 | ORAL_TABLET | Freq: Every day | ORAL | 3 refills | Status: AC

## 2024-07-02 NOTE — Assessment & Plan Note (Signed)
 She sees a scientific laboratory technician and works with a pharmacist, hospital. Will refer to weight management clinic for additional weight loss resources.

## 2024-07-02 NOTE — Assessment & Plan Note (Signed)
 Carotid duplex 10/2019 with 1-39% stenosis RICA  Will update study today given occasional symptoms of dizziness to rule out further progression of disease

## 2024-07-02 NOTE — Assessment & Plan Note (Signed)
 Previously dx w/ sleep apnea. Denies use of CPAP d/t hx of oral lichen planus infection. Patient reports symptoms of sleep disordered breathing fairly well managed w/ new bed that allows for incline during sleep.

## 2024-07-02 NOTE — Patient Instructions (Addendum)
 Medication Instructions:  Your refills have been sent to your pharmacy.  A referral has been placed for you for weight management.  *If you need a refill on your cardiac medications before your next appointment, please call your pharmacy*  Lab Work: LIPID, BMET If you have labs (blood work) drawn today and your tests are completely normal, you will receive your results only by: MyChart Message (if you have MyChart) OR A paper copy in the mail If you have any lab test that is abnormal or we need to change your treatment, we will call you to review the results.  Testing/Procedures: Your physician has requested that you have a carotid duplex. This test is an ultrasound of the carotid arteries in your neck. It looks at blood flow through these arteries that supply the brain with blood. Allow one hour for this exam. There are no restrictions or special instructions.     CT coronary calcium score.   Test locations:   [facility] at [facility]  [facility] at [facility]  This is $99 out of pocket.   Coronary Calcium Scan   A coronary calcium scan is an imaging test used to look for deposits of calcium and other fatty materials (plaques) in the inner lining of the blood vessels of the heart (coronary arteries). These deposits of calcium and plaques can partly clog and narrow the coronary arteries without producing any symptoms or warning signs. This puts a person at risk for a heart attack. This test can detect these deposits before symptoms develop. Tell a health care provider about: Any allergies you have. All medicines you are taking, including vitamins, herbs, eye drops, creams, and over-the-counter medicines. Any problems you or family members have had with anesthetic medicines. Any blood disorders you have. Any surgeries you have had. Any medical conditions you  have. Whether you are pregnant or may be pregnant. What are the risks? Generally, this is a safe procedure. However, problems may occur, including: Harm to a pregnant woman and her unborn baby. This test involves the use of radiation. Radiation exposure can be dangerous to a pregnant woman and her unborn baby. If you are pregnant, you generally should not have this procedure done. Slight increase in the risk of cancer. This is because of the radiation involved in the test. What happens before the procedure? No preparation is needed for this procedure. What happens during the procedure? You will undress and remove any jewelry around your neck or chest. You will put on a hospital gown. Sticky electrodes will be placed on your chest. The electrodes will be connected to an electrocardiogram (ECG) machine to record a tracing of the electrical activity of your heart. A CT scanner will take pictures of your heart. During this time, you will be asked to lie still and hold your breath for 2-3 seconds while a picture of your heart is being taken. The procedure may vary among health care providers and hospitals. What happens after the procedure? You can get dressed. You can return to your normal activities. It is up to you to get the results of your test. Ask your health care provider, or the department that is doing the test, when your results will be ready. Summary A coronary calcium scan is an imaging test used to look for deposits of calcium and other fatty materials (plaques) in the inner lining of the blood vessels of the heart (coronary  arteries). Generally, this is a safe procedure. Tell your health care provider if you are pregnant or may be pregnant. No preparation is needed for this procedure. A CT scanner will take pictures of your heart. You can return to your normal activities after the scan is done. This information is not intended to replace advice given to you by your health care  provider. Make sure you discuss any questions you have with your health care provider. Document Released: 11/10/2007 Document Revised: 04/02/2016 Document Reviewed: 04/02/2016 Elsevier Interactive Patient Education  2017 [employer].   Follow-Up: At [facility], you and your health needs are our priority.  As part of our continuing mission to provide you with exceptional heart care, our providers are all part of one team.  This team includes your primary Cardiologist (physician) and Advanced Practice Providers or APPs (Physician Assistants and Nurse Practitioners) who all work together to provide you with the care you need, when you need it.  Your next appointment:   1 year(s)  Provider:   Wilbert Bihari, MD    We recommend signing up for the patient portal called MyChart.  Sign up information is provided on this After Visit Summary.  MyChart is used to connect with patients for Virtual Visits (Telemedicine).  Patients are able to view lab/test results, encounter notes, upcoming appointments, etc.  Non-urgent messages can be sent to your provider as well.   To learn more about what you can do with MyChart, go to [email or web address].   Other Instructions

## 2024-07-02 NOTE — Assessment & Plan Note (Signed)
 Patient is fasting, will update lipid panel today. Patient's dietitian reported concern of low LDL potentially leading to increased dementia risk later in life Patient concerned given her prominent family history of Alzheimer's and dementia We briefly discussed balancing the risk of developing cardiovascular disease versus dementia risk based on cholesterol values. Ultimately, we decided that we will obtain a CAC score for further risk stratification and then discuss with her primary cardiologist to determine an appropriate LDL goal going forward.  Official statement from the American Heart Association: Lipid-lowering therapies that achieve low LDL-C levels do not increase dementia risk. The American Heart Association states that randomized controlled trials of statins, ezetimibe , and PCSK9 inhibitors show no association between these treatments and cognitive impairment, even when LDL-C is reduced to very low levels (<25 mg/dL). Studies of evolocumab  and alirocumab found no increase in cognitive adverse events with up to 8.4 years of treatment. - Aggressive LDL-C Lowering and the Brain: Impact on Risk for Dementia and Hemorrhagic Stroke: A Scientific Statement From the American Heart Association. Arteriosclerosis, Thrombosis, and Vascular Biology. 2023. Elige LB, Toth PP, Dearborn-Tomazos JL, et al. Continue Repatha  140 mg [place] inj every 2 weeks, bempedoic acid  180 mg daily

## 2024-07-03 ENCOUNTER — Ambulatory Visit: Payer: Self-pay | Admitting: Emergency Medicine

## 2024-07-03 LAB — BASIC METABOLIC PANEL WITH GFR
BUN/Creatinine Ratio: 29 — ABNORMAL HIGH (ref 12–28)
BUN: 25 mg/dL (ref 8–27)
CO2: 22 mmol/L (ref 20–29)
Calcium: 9.7 mg/dL (ref 8.7–10.3)
Chloride: 105 mmol/L (ref 96–106)
Creatinine, Ser: 0.86 mg/dL (ref 0.57–1.00)
Glucose: 99 mg/dL (ref 70–99)
Potassium: 4.9 mmol/L (ref 3.5–5.2)
Sodium: 143 mmol/L (ref 134–144)
eGFR: 76 mL/min/{1.73_m2}

## 2024-07-03 LAB — LIPID PANEL
Chol/HDL Ratio: 3.4 ratio (ref 0.0–4.4)
Cholesterol, Total: 217 mg/dL — ABNORMAL HIGH (ref 100–199)
HDL: 63 mg/dL
LDL Chol Calc (NIH): 135 mg/dL — ABNORMAL HIGH (ref 0–99)
Triglycerides: 107 mg/dL (ref 0–149)
VLDL Cholesterol Cal: 19 mg/dL (ref 5–40)

## 2024-07-03 NOTE — Telephone Encounter (Signed)
 Pt c/o medication issue:  1. Name of Medication:   Evolocumab  (REPATHA  SURECLICK) 140 MG/ML SOAJ    2. How are you currently taking this medication (dosage and times per day)? As written   3. Are you having a reaction (difficulty breathing--STAT)? No   4. What is your medication issue? Pt called in about PA and she also stated that she was told she would be eligible for a grant for Repatha .

## 2024-07-15 ENCOUNTER — Ambulatory Visit (HOSPITAL_COMMUNITY)

## 2024-10-30 ENCOUNTER — Ambulatory Visit: Admitting: Physician Assistant
